# Patient Record
Sex: Female | Born: 1955
Health system: Southern US, Community
[De-identification: ages and names within clinical notes are randomized; demographics above are authoritative.]

## PROBLEM LIST (undated history)

## (undated) DIAGNOSIS — E039 Hypothyroidism, unspecified: Secondary | ICD-10-CM

## (undated) DIAGNOSIS — M47814 Spondylosis without myelopathy or radiculopathy, thoracic region: Secondary | ICD-10-CM

## (undated) DIAGNOSIS — F329 Major depressive disorder, single episode, unspecified: Secondary | ICD-10-CM

## (undated) DIAGNOSIS — F419 Anxiety disorder, unspecified: Secondary | ICD-10-CM

## (undated) DIAGNOSIS — K635 Polyp of colon: Secondary | ICD-10-CM

## (undated) DIAGNOSIS — F32A Depression, unspecified: Secondary | ICD-10-CM

## (undated) DIAGNOSIS — E785 Hyperlipidemia, unspecified: Secondary | ICD-10-CM

## (undated) DIAGNOSIS — R011 Cardiac murmur, unspecified: Secondary | ICD-10-CM

## (undated) DIAGNOSIS — T7840XA Allergy, unspecified, initial encounter: Secondary | ICD-10-CM

## (undated) DIAGNOSIS — I10 Essential (primary) hypertension: Secondary | ICD-10-CM

## (undated) DIAGNOSIS — G8929 Other chronic pain: Secondary | ICD-10-CM

## (undated) DIAGNOSIS — B029 Zoster without complications: Secondary | ICD-10-CM

## (undated) DIAGNOSIS — M542 Cervicalgia: Secondary | ICD-10-CM

## (undated) DIAGNOSIS — M1990 Unspecified osteoarthritis, unspecified site: Secondary | ICD-10-CM

## (undated) DIAGNOSIS — E7800 Pure hypercholesterolemia, unspecified: Secondary | ICD-10-CM

## (undated) DIAGNOSIS — J45909 Unspecified asthma, uncomplicated: Secondary | ICD-10-CM

## (undated) HISTORY — PX: TUBAL LIGATION: SHX77

## (undated) HISTORY — DX: Spondylosis without myelopathy or radiculopathy, thoracic region: M47.814

## (undated) HISTORY — DX: Unspecified asthma, uncomplicated: J45.909

## (undated) HISTORY — DX: Other chronic pain: G89.29

## (undated) HISTORY — PX: OTHER SURGICAL HISTORY: SHX169

## (undated) HISTORY — DX: Unspecified osteoarthritis, unspecified site: M19.90

## (undated) HISTORY — DX: Polyp of colon: K63.5

## (undated) HISTORY — DX: Depression, unspecified: F32.A

## (undated) HISTORY — DX: Essential (primary) hypertension: I10

## (undated) HISTORY — DX: Hypothyroidism, unspecified: E03.9

## (undated) HISTORY — DX: Cervicalgia: M54.2

## (undated) HISTORY — DX: Major depressive disorder, single episode, unspecified: F32.9

## (undated) HISTORY — DX: Pure hypercholesterolemia, unspecified: E78.00

## (undated) HISTORY — DX: Allergy, unspecified, initial encounter: T78.40XA

## (undated) HISTORY — DX: Anxiety disorder, unspecified: F41.9

## (undated) HISTORY — DX: Cardiac murmur, unspecified: R01.1

## (undated) HISTORY — DX: Hyperlipidemia, unspecified: E78.5

## (undated) HISTORY — DX: Zoster without complications: B02.9

---

## 1993-06-10 HISTORY — PX: OTHER SURGICAL HISTORY: SHX169

## 2005-03-27 ENCOUNTER — Ambulatory Visit (HOSPITAL_COMMUNITY): Admission: RE | Admit: 2005-03-27 | Discharge: 2005-03-27 | Payer: Self-pay | Admitting: Emergency Medicine

## 2005-04-08 ENCOUNTER — Encounter: Admission: RE | Admit: 2005-04-08 | Discharge: 2005-04-08 | Payer: Self-pay | Admitting: Emergency Medicine

## 2005-05-10 DIAGNOSIS — E785 Hyperlipidemia, unspecified: Secondary | ICD-10-CM

## 2005-05-10 HISTORY — DX: Hyperlipidemia, unspecified: E78.5

## 2005-05-20 ENCOUNTER — Other Ambulatory Visit: Admission: RE | Admit: 2005-05-20 | Discharge: 2005-05-20 | Payer: Self-pay | Admitting: Obstetrics and Gynecology

## 2006-05-12 ENCOUNTER — Ambulatory Visit (HOSPITAL_COMMUNITY): Admission: RE | Admit: 2006-05-12 | Discharge: 2006-05-12 | Payer: Self-pay | Admitting: Emergency Medicine

## 2006-07-21 ENCOUNTER — Other Ambulatory Visit: Admission: RE | Admit: 2006-07-21 | Discharge: 2006-07-21 | Payer: Self-pay | Admitting: Obstetrics & Gynecology

## 2006-08-05 ENCOUNTER — Encounter: Admission: RE | Admit: 2006-08-05 | Discharge: 2006-08-05 | Payer: Self-pay | Admitting: Emergency Medicine

## 2007-06-11 HISTORY — PX: COLONOSCOPY: SHX174

## 2007-06-18 ENCOUNTER — Ambulatory Visit (HOSPITAL_COMMUNITY): Admission: RE | Admit: 2007-06-18 | Discharge: 2007-06-18 | Payer: Self-pay | Admitting: Emergency Medicine

## 2007-07-27 ENCOUNTER — Other Ambulatory Visit: Admission: RE | Admit: 2007-07-27 | Discharge: 2007-07-27 | Payer: Self-pay | Admitting: Obstetrics & Gynecology

## 2007-10-07 LAB — HM COLONOSCOPY

## 2008-07-13 ENCOUNTER — Encounter: Admission: RE | Admit: 2008-07-13 | Discharge: 2008-07-13 | Payer: Self-pay | Admitting: Obstetrics and Gynecology

## 2008-08-17 ENCOUNTER — Other Ambulatory Visit: Admission: RE | Admit: 2008-08-17 | Discharge: 2008-08-17 | Payer: Self-pay | Admitting: Obstetrics and Gynecology

## 2009-06-10 DIAGNOSIS — E039 Hypothyroidism, unspecified: Secondary | ICD-10-CM

## 2009-06-10 HISTORY — DX: Hypothyroidism, unspecified: E03.9

## 2009-08-08 ENCOUNTER — Encounter: Admission: RE | Admit: 2009-08-08 | Discharge: 2009-08-08 | Payer: Self-pay | Admitting: Obstetrics and Gynecology

## 2010-01-06 ENCOUNTER — Emergency Department (HOSPITAL_COMMUNITY): Admission: EM | Admit: 2010-01-06 | Discharge: 2010-01-06 | Payer: Self-pay | Admitting: Emergency Medicine

## 2010-06-30 ENCOUNTER — Encounter: Payer: Self-pay | Admitting: Emergency Medicine

## 2010-07-01 ENCOUNTER — Encounter: Payer: Self-pay | Admitting: Obstetrics and Gynecology

## 2010-08-25 LAB — BASIC METABOLIC PANEL
BUN: 14 mg/dL (ref 6–23)
CO2: 26 mEq/L (ref 19–32)
Calcium: 8.1 mg/dL — ABNORMAL LOW (ref 8.4–10.5)
Chloride: 108 mEq/L (ref 96–112)
Creatinine, Ser: 0.68 mg/dL (ref 0.4–1.2)
GFR calc Af Amer: >60 mL/min (ref >60)
GFR calc non Af Amer: >60 mL/min (ref >60)
Glucose, Bld: 120 mg/dL — ABNORMAL HIGH (ref 70–99)
Potassium: 3.5 mEq/L (ref 3.5–5.1)
Sodium: 140 mEq/L (ref 135–145)

## 2010-08-25 LAB — DIFFERENTIAL
Basophils Absolute: 0 10*3/uL (ref 0.0–0.1)
Basophils Relative: 0 % (ref 0–1)
Eosinophils Absolute: 0.1 10*3/uL (ref 0.0–0.7)
Eosinophils Relative: 2 % (ref 0–5)
Lymphocytes Relative: 16 % (ref 12–46)
Lymphs Abs: 1 10*3/uL (ref 0.7–4.0)
Monocytes Absolute: 0.4 10*3/uL (ref 0.1–1.0)
Monocytes Relative: 6 % (ref 3–12)
Neutro Abs: 5 10*3/uL (ref 1.7–7.7)
Neutrophils Relative %: 77 % (ref 43–77)

## 2010-08-25 LAB — CBC
HCT: 35.9 % — ABNORMAL LOW (ref 36.0–46.0)
Hemoglobin: 12.3 g/dL (ref 12.0–15.0)
MCH: 31.8 pg (ref 26.0–34.0)
MCHC: 34.2 g/dL (ref 30.0–36.0)
MCV: 92.9 fL (ref 78.0–100.0)
Platelets: 178 10*3/uL (ref 150–400)
RBC: 3.86 MIL/uL — ABNORMAL LOW (ref 3.87–5.11)
RDW: 13 % (ref 11.5–15.5)
WBC: 6.6 10*3/uL (ref 4.0–10.5)

## 2010-08-25 LAB — URINALYSIS, ROUTINE W REFLEX MICROSCOPIC
Bilirubin Urine: NEGATIVE
Glucose, UA: NEGATIVE mg/dL
Hgb urine dipstick: NEGATIVE
Ketones, ur: NEGATIVE mg/dL
Nitrite: NEGATIVE
Protein, ur: NEGATIVE mg/dL
Specific Gravity, Urine: 1.015 (ref 1.005–1.030)
Urobilinogen, UA: 0.2 mg/dL (ref 0.0–1.0)
pH: 8 (ref 5.0–8.0)

## 2010-08-25 LAB — URINE CULTURE
Colony Count: NO GROWTH
Culture: NO GROWTH

## 2010-08-25 LAB — POCT CARDIAC MARKERS
CKMB, poc: <1 ng/mL — ABNORMAL LOW (ref 1.0–8.0)
Myoglobin, poc: 19.2 ng/mL (ref 12–200)
Troponin i, poc: <0.05 ng/mL (ref 0.00–0.09)

## 2010-09-24 ENCOUNTER — Other Ambulatory Visit: Payer: Self-pay | Admitting: Obstetrics and Gynecology

## 2010-09-24 DIAGNOSIS — Z1231 Encounter for screening mammogram for malignant neoplasm of breast: Secondary | ICD-10-CM

## 2010-10-03 ENCOUNTER — Ambulatory Visit (HOSPITAL_COMMUNITY)
Admission: RE | Admit: 2010-10-03 | Discharge: 2010-10-03 | Disposition: A | Discharge status: Home / Self Care | Payer: BLUE CROSS/BLUE SHIELD | Source: Ambulatory Visit | Attending: Obstetrics and Gynecology | Admitting: Obstetrics and Gynecology

## 2010-10-03 DIAGNOSIS — Z1231 Encounter for screening mammogram for malignant neoplasm of breast: Secondary | ICD-10-CM | POA: Insufficient documentation

## 2011-05-16 ENCOUNTER — Other Ambulatory Visit: Payer: Self-pay | Admitting: Family Medicine

## 2011-05-16 DIAGNOSIS — E039 Hypothyroidism, unspecified: Secondary | ICD-10-CM

## 2011-06-07 ENCOUNTER — Other Ambulatory Visit: Payer: Self-pay | Admitting: Obstetrics & Gynecology

## 2011-06-07 ENCOUNTER — Other Ambulatory Visit: Payer: Self-pay | Admitting: Obstetrics and Gynecology

## 2011-06-07 DIAGNOSIS — N644 Mastodynia: Secondary | ICD-10-CM

## 2011-06-11 DIAGNOSIS — B029 Zoster without complications: Secondary | ICD-10-CM

## 2011-06-11 HISTORY — DX: Zoster without complications: B02.9

## 2011-06-17 ENCOUNTER — Other Ambulatory Visit: Payer: BLUE CROSS/BLUE SHIELD

## 2011-07-24 ENCOUNTER — Other Ambulatory Visit: Payer: Self-pay | Admitting: Family Medicine

## 2011-07-24 DIAGNOSIS — E039 Hypothyroidism, unspecified: Secondary | ICD-10-CM

## 2011-07-30 ENCOUNTER — Ambulatory Visit
Admission: RE | Admit: 2011-07-30 | Discharge: 2011-07-30 | Disposition: A | Discharge status: Home / Self Care | Payer: BC Managed Care – PPO | Source: Ambulatory Visit | Attending: Family Medicine | Admitting: Family Medicine

## 2011-07-30 DIAGNOSIS — E039 Hypothyroidism, unspecified: Secondary | ICD-10-CM

## 2011-09-27 LAB — HM PAP SMEAR: HM Pap smear: NEGATIVE

## 2011-10-08 ENCOUNTER — Other Ambulatory Visit: Payer: Self-pay | Admitting: Nurse Practitioner

## 2011-10-08 DIAGNOSIS — Z1231 Encounter for screening mammogram for malignant neoplasm of breast: Secondary | ICD-10-CM

## 2011-11-01 ENCOUNTER — Ambulatory Visit (HOSPITAL_COMMUNITY): Payer: BC Managed Care – PPO

## 2011-12-11 ENCOUNTER — Ambulatory Visit (HOSPITAL_COMMUNITY)
Admission: RE | Admit: 2011-12-11 | Discharge: 2011-12-11 | Disposition: A | Discharge status: Home / Self Care | Payer: BC Managed Care – PPO | Source: Ambulatory Visit | Attending: Nurse Practitioner | Admitting: Nurse Practitioner

## 2011-12-11 DIAGNOSIS — Z1231 Encounter for screening mammogram for malignant neoplasm of breast: Secondary | ICD-10-CM

## 2011-12-11 LAB — HM MAMMOGRAPHY: HM Mammogram: NEGATIVE

## 2012-09-28 ENCOUNTER — Encounter: Payer: Self-pay | Admitting: *Deleted

## 2012-09-28 ENCOUNTER — Ambulatory Visit: Payer: Self-pay | Admitting: Nurse Practitioner

## 2012-09-28 DIAGNOSIS — Z01419 Encounter for gynecological examination (general) (routine) without abnormal findings: Secondary | ICD-10-CM

## 2012-09-29 ENCOUNTER — Encounter: Payer: Self-pay | Admitting: Nurse Practitioner

## 2012-11-06 ENCOUNTER — Ambulatory Visit: Payer: Self-pay | Admitting: Nurse Practitioner

## 2012-11-30 ENCOUNTER — Other Ambulatory Visit: Payer: Self-pay | Admitting: Nurse Practitioner

## 2012-11-30 DIAGNOSIS — Z1231 Encounter for screening mammogram for malignant neoplasm of breast: Secondary | ICD-10-CM

## 2012-12-01 ENCOUNTER — Ambulatory Visit (INDEPENDENT_AMBULATORY_CARE_PROVIDER_SITE_OTHER): Payer: BC Managed Care – PPO | Admitting: Nurse Practitioner

## 2012-12-01 ENCOUNTER — Encounter: Payer: Self-pay | Admitting: Nurse Practitioner

## 2012-12-01 VITALS — BP 104/60 | HR 60 | Ht 62.0 in | Wt 137.0 lb

## 2012-12-01 DIAGNOSIS — Z0000 Encounter for general adult medical examination without abnormal findings: Secondary | ICD-10-CM

## 2012-12-01 DIAGNOSIS — Z01419 Encounter for gynecological examination (general) (routine) without abnormal findings: Secondary | ICD-10-CM

## 2012-12-01 LAB — POCT URINALYSIS DIPSTICK
Bilirubin, UA: NEGATIVE
Blood, UA: NEGATIVE
Glucose, UA: NEGATIVE
Ketones, UA: NEGATIVE
Leukocytes, UA: NEGATIVE
Nitrite, UA: NEGATIVE
Protein, UA: NEGATIVE
Urobilinogen, UA: NEGATIVE
pH, UA: 5

## 2012-12-01 MED ORDER — ESTRADIOL 0.5 MG PO TABS: 0.5000 mg | ORAL_TABLET | Freq: Every day | ORAL | Status: DC

## 2012-12-01 MED ORDER — MEDROXYPROGESTERONE ACETATE 5 MG PO TABS: 5.0000 mg | ORAL_TABLET | Freq: Every day | ORAL | Status: DC

## 2012-12-01 NOTE — Patient Instructions (Addendum)

## 2012-12-01 NOTE — Progress Notes (Signed)
Patient ID: Sonia Kim, female   DOB: 23-Apr-1956, 57 y.o.   MRN: [id number] 57 y.o. G3P3 Married Caucasian Fe here for annual exam. She feels well. Her youngest daughter goes to college this fall. She has had no further vaginal spotting since having episode of PMB 10/13. Endo biopsy showed polyp without hyperplasia.    No LMP recorded. Patient is not currently having periods (Reason: Perimenopausal).          Sexually active: no  The current method of family planning is none.    Exercising: yes  Home exercise routine includes walking, running. Smoker:  no  Health Maintenance: Pap:  09/27/11 normal with negative HR HPV MMG:  12/11/11 scheduled in July Colonoscopy:  10/07/07 polyp recheck in 5 years will schedule for fall BMD:   Never    TDaP:  2013 Labs: today   reports that she quit smoking about 8 years ago. She has never used smokeless tobacco. She reports that  drinks alcohol. She reports that she does not use illicit drugs.  Past Medical History  Diagnosis Date  . Anxiety   . Hyperlipidemia 05/2005  . Hypothyroid     Past Surgical History  Procedure Laterality Date  . Tubal ligation  05/2005    Current Outpatient Prescriptions  Medication Sig Dispense Refill  . estradiol (ESTRACE) 0.5 MG tablet Take 1 tablet by mouth daily.      . fluticasone (FLONASE) 50 MCG/ACT nasal spray as needed.      . Glucosamine-Chondroit-Vit C-Mn (GLUCOSAMINE 1500 COMPLEX PO) Take by mouth daily.      Marland Kitchen levothyroxine (SYNTHROID) 25 MCG tablet Take 25 mcg by mouth daily before breakfast.      . medroxyPROGESTERone (PROVERA) 5 MG tablet Take 1 tablet by mouth daily.      . sertraline (ZOLOFT) 50 MG tablet Take 50 mg by mouth daily.      . simvastatin (ZOCOR) 10 MG tablet Take 10 mg by mouth at bedtime.       No current facility-administered medications for this visit.    Family History  Problem Relation Age of Onset  . Thyroid disease Mother   . CVA Father   . Diabetes Father   . Lung  cancer Mother     ROS:  Pertinent items are noted in HPI.  Otherwise, a comprehensive ROS was negative.  Exam:   BP 104/60  Pulse 60  Ht 5\' 2"  (1.575 m)  Wt 137 lb (62.143 kg)  BMI 25.05 kg/m2 Height: 5\' 2"  (157.5 cm)  Ht Readings from Last 3 Encounters:  12/01/12 5\' 2"  (1.575 m)    General appearance: alert, cooperative and appears stated age Head: Normocephalic, without obvious abnormality, atraumatic Neck: no adenopathy, supple, symmetrical, trachea midline and thyroid normal to inspection and palpation Lungs: clear to auscultation bilaterally Breasts: normal appearance, no masses or tenderness Heart: regular rate and rhythm Abdomen: soft, non-tender; no masses,  no organomegaly Extremities: extremities normal, atraumatic, no cyanosis or edema Skin: Skin color, texture, turgor normal. No rashes or lesions Lymph nodes: Cervical, supraclavicular, and axillary nodes normal. No abnormal inguinal nodes palpated Neurologic: Grossly normal   Pelvic: External genitalia:  no lesions              Urethra:  normal appearing urethra with no masses, tenderness or lesions              Bartholin's and Skene's: normal  Vagina: normal appearing vagina with normal color and discharge, no lesions              Cervix: anteverted              Pap taken: no Bimanual Exam:  Uterus:  normal size, contour, position, consistency, mobility, non-tender              Adnexa: no mass, fullness, tenderness               Rectovaginal: Confirms               Anus:  normal sphincter tone, no lesions  A:  Well Woman with normal exam  Postmenopausal on HRT  PMB 03/2012 secondary to polyp  Hypothyroid on replacement  P:   Pap smear as per guidelines  Refill HRT estrace and provera for this next year.  Counseled on risk and benefits of HRT including CVA, DVT, cancer, etc.  Mammogram due in July  counseled on breast self exam, adequate intake of calcium and vitamin D,   diet and exercise,  Kegel's exercises return annually or prn  An After Visit Summary was printed and given to the patient.

## 2012-12-02 NOTE — Progress Notes (Signed)
Encounter reviewed by Dr. Flo Berroa Silva.  

## 2012-12-22 ENCOUNTER — Ambulatory Visit (HOSPITAL_COMMUNITY): Payer: BC Managed Care – PPO

## 2013-01-01 ENCOUNTER — Ambulatory Visit (HOSPITAL_COMMUNITY)
Admission: RE | Admit: 2013-01-01 | Discharge: 2013-01-01 | Disposition: A | Discharge status: Home / Self Care | Payer: BC Managed Care – PPO | Source: Ambulatory Visit | Attending: Nurse Practitioner | Admitting: Nurse Practitioner

## 2013-01-01 DIAGNOSIS — Z1231 Encounter for screening mammogram for malignant neoplasm of breast: Secondary | ICD-10-CM | POA: Insufficient documentation

## 2013-09-09 ENCOUNTER — Telehealth: Payer: Self-pay | Admitting: Nurse Practitioner

## 2013-09-09 ENCOUNTER — Encounter: Payer: Self-pay | Admitting: Nurse Practitioner

## 2013-09-09 NOTE — Telephone Encounter (Signed)
Spoke with patient. For about a week and a half she is had had bilateral breast tenderness, denies pain, and also increased in vaginal discharge and vaginal itching. She has not taken her estrace in two days because she was afraid estrace was r/t to symptoms. She is now experiencing hot flashes which she feels is r/s dc Estrace and progesterone. Patient wanted to know what Patty would suggest. I advised office visit for evaluation of both breast and pelvic concerns.  Patient requests to wait until next week for visit, requests appointment on 09/17/13 and scheduled with Lauro FranklinPatricia Rolen-Grubb, FNP. Patient will call back if any new concerns or changes in symptoms.     Routing to provider for final review. Patient agreeable to disposition. Will close encounter

## 2013-09-09 NOTE — Telephone Encounter (Signed)
Patient says she is having breast tenderness and problems "down below". Patient thinks this may be related to hormones.

## 2013-09-16 ENCOUNTER — Other Ambulatory Visit: Payer: Self-pay | Admitting: Nurse Practitioner

## 2013-09-16 ENCOUNTER — Ambulatory Visit (INDEPENDENT_AMBULATORY_CARE_PROVIDER_SITE_OTHER): Payer: BC Managed Care – PPO | Admitting: Nurse Practitioner

## 2013-09-16 ENCOUNTER — Encounter: Payer: Self-pay | Admitting: Nurse Practitioner

## 2013-09-16 VITALS — BP 116/64 | HR 68 | Ht 62.0 in | Wt 140.0 lb

## 2013-09-16 DIAGNOSIS — N644 Mastodynia: Secondary | ICD-10-CM

## 2013-09-16 NOTE — Progress Notes (Signed)
Subjective:     Patient ID: Sonia Labrumenise D Kim, female   DOB: August 24, 1955, 58 y.o.   MRN: [id number]  Vaginal Itching The patient's primary symptoms include a vaginal discharge. The patient's pertinent negatives include no pelvic pain. Pertinent negatives include no abdominal pain, chills, constipation, diarrhea, dysuria, fever, flank pain, frequency, nausea, urgency or vomiting.   This G3, P3 WM 58 yo Fe complains of vaginal discomfort for about 2 weeks.  Symptoms are intermittent. "Feels like things are changing down there".  Maybe slight itching but no discharge. Sometimes feels swollen and dryer.   Has not been SA in years.  Denies urinary symptoms. Also complains of bilateral breast tenderness about the same time.  She had been wearing a sports bra and going to the gym so she thought that may be related.  She has held her exercise routine and things have not changed.  Tenderness is there all day - she did try wearing a sports bra to bed to see if that would help it did not.  There has been no history of injury or trauma.  She did go off HRT for 2 days and no change in symptoms.  She has not changed other med's and is on no Herbal med's. No increase in caffeine.   When she has a combination of both symptoms she thought maybe was going to start a cycle. But has had no bleeding. No complaints of nipple discharge.  Review of Systems  Constitutional: Negative for fever, chills and fatigue.  Respiratory: Negative.   Cardiovascular: Negative.   Gastrointestinal: Negative.  Negative for nausea, vomiting, abdominal pain, diarrhea and constipation.  Genitourinary: Positive for vaginal discharge and vaginal pain. Negative for dysuria, urgency, frequency, flank pain, vaginal bleeding and pelvic pain.       Last episode of PMB was 03/26/12, she had endo polyp without evidence of hyperplasia on endo biopsy. On HRT since 12/2010. Last mammo 7/ 2014 normal  Musculoskeletal: Negative.  Negative for myalgias.   Neurological: Negative.   Psychiatric/Behavioral: Negative.        Objective:   Physical Exam  Constitutional: She is oriented to person, place, and time. She appears well-developed and well-nourished.  Pulmonary/Chest: Effort normal.  Abdominal: Soft. She exhibits no distension. There is no tenderness. There is no rebound and no guarding.  No rash or areas of infection. Breast bilateral tenderness around the areola and nipples without a mass. She does have FBC's changes and they are dense.  No nipple discharge and no lymph nodes.  Genitourinary:  Atrophic vaginal changes both externally and internally.  No vaginal discharge. There is 1st degree rectocele. No pain on bimanual.  Neurological: She is alert and oriented to person, place, and time.  Skin: Skin is warm and dry.  Psychiatric: She has a normal mood and affect. Her behavior is normal. Judgment and thought content normal.       Assessment:     Bilateral  breast tenderness ? Etiology Atrophic vaginitis without signs of infection Postmenopausal on HRT    Plan:     Will get bilateral diagnostic Mammo and will follow Advised to use extra virgin olive oil to outside vulvar areas after a bath.  She may also use Aveeno bath prn. If symptoms persist to call back

## 2013-09-16 NOTE — Patient Instructions (Signed)
We will schedule Mammo

## 2013-09-16 NOTE — Progress Notes (Signed)
Diagnostic MMG with possible ultrasound scheduled for 09-27-13 0830 at [facility].  Patient agreeable to date and time.

## 2013-09-21 NOTE — Progress Notes (Signed)
Encounter reviewed by Dr. Ocean Schildt Silva.  

## 2013-09-24 NOTE — Telephone Encounter (Signed)
Patient calling to report she is cancelling her MMG appointment for breast tenderness 09/[facility]/13 [facility] [place]. She states she is feeling better and will just have her regular screening MMG in July.

## 2013-09-24 NOTE — Telephone Encounter (Signed)
Dr. Hyacinth MeekerMiller, patient has cancelled her dx mammogram. Should she be removed from hold? A letter sent?

## 2013-09-27 ENCOUNTER — Other Ambulatory Visit: Payer: BC Managed Care – PPO

## 2013-10-18 NOTE — Telephone Encounter (Signed)
Out of hold. Will close encounter.

## 2013-10-18 NOTE — Telephone Encounter (Signed)
I am ok with her canceling this.  No significant breast hx.  Was scheduled due to pain.  Ok to removed from hold.  Thanks.

## 2013-12-03 ENCOUNTER — Ambulatory Visit: Payer: BC Managed Care – PPO | Admitting: Nurse Practitioner

## 2013-12-06 ENCOUNTER — Ambulatory Visit (INDEPENDENT_AMBULATORY_CARE_PROVIDER_SITE_OTHER): Payer: BC Managed Care – PPO | Admitting: Nurse Practitioner

## 2013-12-06 ENCOUNTER — Encounter: Payer: Self-pay | Admitting: Nurse Practitioner

## 2013-12-06 VITALS — BP 120/54 | HR 60 | Ht 62.0 in | Wt 137.0 lb

## 2013-12-06 DIAGNOSIS — Z79890 Hormone replacement therapy (postmenopausal): Secondary | ICD-10-CM

## 2013-12-06 DIAGNOSIS — Z01419 Encounter for gynecological examination (general) (routine) without abnormal findings: Secondary | ICD-10-CM

## 2013-12-06 DIAGNOSIS — Z0000 Encounter for general adult medical examination without abnormal findings: Secondary | ICD-10-CM

## 2013-12-06 LAB — POCT URINALYSIS DIPSTICK
Bilirubin, UA: NEGATIVE
Blood, UA: NEGATIVE
Glucose, UA: NEGATIVE
Ketones, UA: NEGATIVE
Leukocytes, UA: NEGATIVE
Nitrite, UA: NEGATIVE
Protein, UA: NEGATIVE
Urobilinogen, UA: NEGATIVE
pH, UA: 6

## 2013-12-06 MED ORDER — MEDROXYPROGESTERONE ACETATE 5 MG PO TABS: 5.0000 mg | ORAL_TABLET | Freq: Every day | ORAL | Status: DC

## 2013-12-06 MED ORDER — ESTRADIOL 0.5 MG PO TABS: 0.5000 mg | ORAL_TABLET | Freq: Every day | ORAL | Status: DC

## 2013-12-06 NOTE — Progress Notes (Signed)
58 y.o. G3P3 Married Caucasian Fe here for annual exam.  She feels well. No new health problems. She wants to continue on HRT but OK to try a lower dose in the fall.  Patient's last menstrual period was 03/10/2010.          Sexually active: No.  The current method of family planning is none.    Exercising: Yes.    Home exercise routine includes walking and eliptical. Smoker:  no  Health Maintenance: Pap:  09/27/11 normal MMG:  01/01/13, WNL Colonoscopy:  10/07/2007 BMD:   Not done TDaP:  2013 Labs: PCP    Urine: Normal   reports that she quit smoking about 8 years ago. She has never used smokeless tobacco. She reports that she drinks alcohol. She reports that she does not use illicit drugs.  Past Medical History  Diagnosis Date  . Anxiety   . Hyperlipidemia 05/2005  . Hypothyroid 06/2009  . Hypercholesteremia     Past Surgical History  Procedure Laterality Date  . Tubal ligation  1984 & 05/2005    reversal in 1995    Current Outpatient Prescriptions  Medication Sig Dispense Refill  . ALPRAZolam (XANAX) 0.5 MG tablet Take 0.5 mg by mouth as needed for anxiety.      Marland Kitchen. estradiol (ESTRACE) 0.5 MG tablet Take 1 tablet (0.5 mg total) by mouth daily.  90 tablet  3  . fluticasone (FLONASE) 50 MCG/ACT nasal spray as needed.      . Glucosamine-Chondroit-Vit C-Mn (GLUCOSAMINE 1500 COMPLEX PO) Take by mouth daily.      Marland Kitchen. levothyroxine (SYNTHROID, LEVOTHROID) 50 MCG tablet Take 50 mcg by mouth daily before breakfast.      . medroxyPROGESTERone (PROVERA) 5 MG tablet Take 1 tablet (5 mg total) by mouth daily.  90 tablet  3  . sertraline (ZOLOFT) 25 MG tablet Take 25 mg by mouth daily.      . simvastatin (ZOCOR) 10 MG tablet Take 10 mg by mouth at bedtime.       No current facility-administered medications for this visit.    Family History  Problem Relation Age of Onset  . Thyroid disease Mother   . Lung cancer Mother   . CVA Father   . Diabetes Father     ROS:  Pertinent items are  noted in HPI.  Otherwise, a comprehensive ROS was negative.  Exam:   BP 120/54  Pulse 60  Ht 5\' 2"  (1.575 m)  Wt 137 lb (62.143 kg)  BMI 25.05 kg/m2  LMP 03/10/2010 Height: 5\' 2"  (157.5 cm)  Ht Readings from Last 3 Encounters:  12/06/13 5\' 2"  (1.575 m)  09/16/13 5\' 2"  (1.575 m)  12/01/12 5\' 2"  (1.575 m)    General appearance: alert, cooperative and appears stated age Head: Normocephalic, without obvious abnormality, atraumatic Neck: no adenopathy, supple, symmetrical, trachea midline and thyroid normal to inspection and palpation Lungs: clear to auscultation bilaterally Breasts: normal appearance, no masses or tenderness Heart: regular rate and rhythm Abdomen: soft, non-tender; no masses,  no organomegaly Extremities: extremities normal, atraumatic, no cyanosis or edema Skin: Skin color, texture, turgor normal. No rashes or lesions Lymph nodes: Cervical, supraclavicular, and axillary nodes normal. No abnormal inguinal nodes palpated Neurologic: Grossly normal   Pelvic: External genitalia:  no lesions              Urethra:  normal appearing urethra with no masses, tenderness or lesions  Bartholin's and Skene's: normal                 Vagina: normal appearing vagina with normal color and discharge, no lesions              Cervix: anteverted              Pap taken: No. Bimanual Exam:  Uterus:  normal size, contour, position, consistency, mobility, non-tender              Adnexa: no mass, fullness, tenderness               Rectovaginal: Confirms               Anus:  normal sphincter tone, no lesions  A:  Well Woman with normal exam  Postmenopausal on HRT since 3/ 2012  History of PMB on HRT with negative eval on 03/26/2012  P:   Reviewed health and wellness pertinent to exam  Pap smear not taken today  Mammogram is due 12/2013  Refill on Estrace 05 mg and Provera 5 mg daily for a year  Counseled on potential risk of DVT, CVA, cancer, etc.  She is going to try  decrease to 1/2 tablet of HRT this fall and see how she tolerates until next AEX - will try taper even more  Counseled on breast self exam, mammography screening, menopause, osteoporosis, adequate intake of calcium and vitamin D, diet and exercise, Kegel's exercises return annually or prn  An After Visit Summary was printed and given to the patient.

## 2013-12-06 NOTE — Patient Instructions (Signed)

## 2013-12-07 NOTE — Progress Notes (Signed)
Encounter reviewed by Dr. Taaj Hurlbut Silva.  

## 2013-12-16 ENCOUNTER — Telehealth: Payer: Self-pay | Admitting: Emergency Medicine

## 2013-12-16 ENCOUNTER — Other Ambulatory Visit: Payer: Self-pay | Admitting: Nurse Practitioner

## 2013-12-16 DIAGNOSIS — Z1231 Encounter for screening mammogram for malignant neoplasm of breast: Secondary | ICD-10-CM

## 2013-12-16 NOTE — Telephone Encounter (Signed)
Radiology tech from [facility] calling. She wanted to clarify that patient was okay to schedule screening mammogram only at [facility].  States that patient has advised no longer having breast pain and declines diagnostic testing.  Patient cancelled her dx imaging at breast center.  Advised since patient had recent annual examination with Lauro FranklinPatricia Rolen-Grubb, FNP and is due for screening in notes, can do screening only since patient denies further breast tenderness.  Patty do you agree?

## 2013-12-17 NOTE — Telephone Encounter (Signed)
Yes agree with screening - then if anything found can do diagnostic.

## 2014-01-05 ENCOUNTER — Ambulatory Visit (HOSPITAL_COMMUNITY): Payer: BC Managed Care – PPO

## 2014-01-17 ENCOUNTER — Ambulatory Visit (HOSPITAL_COMMUNITY)
Admission: RE | Admit: 2014-01-17 | Discharge: 2014-01-17 | Disposition: A | Discharge status: Home / Self Care | Payer: BC Managed Care – PPO | Source: Ambulatory Visit | Attending: Nurse Practitioner | Admitting: Nurse Practitioner

## 2014-01-17 DIAGNOSIS — Z1231 Encounter for screening mammogram for malignant neoplasm of breast: Secondary | ICD-10-CM | POA: Insufficient documentation

## 2014-04-11 ENCOUNTER — Encounter: Payer: Self-pay | Admitting: Nurse Practitioner

## 2014-06-27 ENCOUNTER — Telehealth: Payer: Self-pay | Admitting: Nurse Practitioner

## 2014-06-27 NOTE — Telephone Encounter (Signed)
Patient calling requesting to speak with the nurse about "constant hot flashes."

## 2014-06-27 NOTE — Telephone Encounter (Signed)
Spoke with patient. Patient states that when she was last seen for her aex in June she discussed lowering dose of HRT in the fall. States she began to take half the prescribed dose for one month with increased hot flashes. "It was terrbiel so I went back on the full dose. Now I am having hot flashes throughout the night. I am not sleeping at all and the day is miserable. My hormones are crazy too." Currently taking Estrace 0.5mg  and Provera 5mg  daily. Advised will need to come in for office visit with Lauro FranklinPatricia Rolen-Grubb, FNP for evaluation. Patient is agreeable. Appointment scheduled for 1/22 at 10:15am with Lauro FranklinPatricia Rolen-Grubb, FNP. Agreeable to date and time.  Routing to provider for final review. Patient agreeable to disposition. Will close encounter

## 2014-07-01 ENCOUNTER — Ambulatory Visit: Payer: Self-pay | Admitting: Nurse Practitioner

## 2014-07-04 ENCOUNTER — Ambulatory Visit: Payer: Self-pay | Admitting: Nurse Practitioner

## 2014-07-04 ENCOUNTER — Telehealth: Payer: Self-pay | Admitting: Nurse Practitioner

## 2014-07-04 NOTE — Telephone Encounter (Signed)
Pt DNKA today bad weather LMTCB 'increased hot flashes and mood swings on HRT"

## 2014-07-08 NOTE — Telephone Encounter (Signed)
Attempted to reach patient at number provided [phone]. Phone call was answered and then disconnected. Will try again later.

## 2014-07-12 ENCOUNTER — Encounter: Payer: Self-pay | Admitting: Nurse Practitioner

## 2014-07-12 ENCOUNTER — Ambulatory Visit (INDEPENDENT_AMBULATORY_CARE_PROVIDER_SITE_OTHER): Payer: 59 | Admitting: Nurse Practitioner

## 2014-07-12 VITALS — BP 120/66 | HR 64 | Ht 62.0 in | Wt 136.0 lb

## 2014-07-12 DIAGNOSIS — R232 Flushing: Secondary | ICD-10-CM

## 2014-07-12 LAB — CBC WITH DIFFERENTIAL/PLATELET
Basophils Absolute: 0.1 10*3/uL (ref 0.0–0.1)
Basophils Relative: 1 % (ref 0–1)
Eosinophils Absolute: 0.2 10*3/uL (ref 0.0–0.7)
Eosinophils Relative: 2 % (ref 0–5)
HCT: 41.7 % (ref 36.0–46.0)
Hemoglobin: 14 g/dL (ref 12.0–15.0)
Lymphocytes Relative: 23 % (ref 12–46)
Lymphs Abs: 2.2 10*3/uL (ref 0.7–4.0)
MCH: 31.2 pg (ref 26.0–34.0)
MCHC: 33.6 g/dL (ref 30.0–36.0)
MCV: 92.9 fL (ref 78.0–100.0)
MPV: 10.6 fL (ref 8.6–12.4)
Monocytes Absolute: 0.7 10*3/uL (ref 0.1–1.0)
Monocytes Relative: 7 % (ref 3–12)
Neutro Abs: 6.3 10*3/uL (ref 1.7–7.7)
Neutrophils Relative %: 67 % (ref 43–77)
Platelets: 262 10*3/uL (ref 150–400)
RBC: 4.49 MIL/uL (ref 3.87–5.11)
RDW: 12.9 % (ref 11.5–15.5)
WBC: 9.4 10*3/uL (ref 4.0–10.5)

## 2014-07-12 NOTE — Telephone Encounter (Signed)
Patient is scheduled for today 2/2 at 12:45pm with Lauro FranklinPatricia Rolen-Grubb, FNP.  Routing to provider for final review. Patient agreeable to disposition. Will close encounter

## 2014-07-12 NOTE — Progress Notes (Signed)
Patient ID: Curlene LabrumDenise D Cashatt, female   DOB: Jul 11, 1955, 59 y.o.   MRN: [id number] S: This 59 yo WM Fe presents for a consultation about increase in vaso symptoms.  She has been on HRT since 08/2010.  In June 2015 at [facility] she was asked to come down to a lower dose of pill at 1/2 dose in the fall.  She was given rationale and agreed to taper down.  Come the fall right away noted as increase in vaso symptoms with night sweats that were the worst.  Increase in insomnia and mood changes.  The taper was only done for 2 weeks and then back to regular dosing.  Maybe did OK for a short time but since then has had an increase in symptoms despite anything she does.  She has tried to increase Zoloft recently 2 weeks ago from 25 mg to 50 mg without help  Some situational stressors with her daughter going through a divorce.   Other symptoms of increase in anxiety.  She has Xanax but only takes maybe 1 time a week.  She also has fatigue from the insomnia.  She did see PCP in December and had labs done - will get copies of those and compare.  Denies any other constitutional symptoms.   A: Increase in Vaso symptoms with insomnia  Situational stressors  Plan: Will check Estradiol, TSH, Vit D, and CBC and follow  Will get previous labs from PCP  She is given card for Berniece AndreasJulie Whitt in case she feels she needs counseling  Consultation time:  Mother and daughter is 15 minutes

## 2014-07-13 LAB — VITAMIN D 25 HYDROXY (VIT D DEFICIENCY, FRACTURES): Vit D, 25-Hydroxy: 32 ng/mL (ref 30–100)

## 2014-07-13 LAB — TSH: TSH: 2.672 u[IU]/mL (ref 0.350–4.500)

## 2014-07-13 LAB — ESTRADIOL: Estradiol: 27.3 pg/mL

## 2014-07-17 NOTE — Progress Notes (Signed)
Encounter reviewed by Dr. Selma Mink Silva.  

## 2014-08-01 ENCOUNTER — Telehealth: Payer: Self-pay | Admitting: Nurse Practitioner

## 2014-08-01 MED ORDER — ESTRADIOL 0.5 MG PO TABS: 0.5000 mg | ORAL_TABLET | Freq: Every day | ORAL | Status: DC

## 2014-08-01 MED ORDER — MEDROXYPROGESTERONE ACETATE 5 MG PO TABS: 5.0000 mg | ORAL_TABLET | Freq: Every day | ORAL | Status: DC

## 2014-08-01 NOTE — Telephone Encounter (Signed)
Pt needs refill for estradiol and medroxy tabs [facility] at 1 [phone].

## 2014-08-01 NOTE — Telephone Encounter (Signed)
Medication refill request: Provera and estradiol Last AEX:  12/06/13 Next AEX: 12/08/14 PG Last MMG (if hormonal medication request): 01/17/14 BIRADS1:neg Refill authorized: both 12/06/13 x 1 year [facility]ills [facility]  Patient using new mail order - [facility]. Rx sent to [facility] with refills left until 11/2014 as prescribed by PG  -[facility] for final [facility]. - Encounter closed.

## 2014-12-08 ENCOUNTER — Ambulatory Visit: Payer: BC Managed Care – PPO | Admitting: Nurse Practitioner

## 2014-12-23 ENCOUNTER — Other Ambulatory Visit: Payer: Self-pay | Admitting: Nurse Practitioner

## 2014-12-23 NOTE — Telephone Encounter (Signed)
Medication refill request: Provera 5  Estrace 0.5 Last AEX:  12/06/13 with PG Next AEX: 01/16/15 with PG Last MMG (if hormonal medication request): 01/17/14 Bi-rads C 1 neg. Refill authorized: Please advise.

## 2014-12-27 ENCOUNTER — Other Ambulatory Visit (HOSPITAL_COMMUNITY): Payer: Self-pay | Admitting: Family Medicine

## 2014-12-27 DIAGNOSIS — Z1231 Encounter for screening mammogram for malignant neoplasm of breast: Secondary | ICD-10-CM

## 2015-01-16 ENCOUNTER — Ambulatory Visit (INDEPENDENT_AMBULATORY_CARE_PROVIDER_SITE_OTHER): Payer: 59 | Admitting: Nurse Practitioner

## 2015-01-16 ENCOUNTER — Encounter: Payer: Self-pay | Admitting: Nurse Practitioner

## 2015-01-16 VITALS — BP 120/68 | HR 62 | Resp 14 | Ht 62.5 in | Wt 134.0 lb

## 2015-01-16 DIAGNOSIS — E559 Vitamin D deficiency, unspecified: Secondary | ICD-10-CM | POA: Insufficient documentation

## 2015-01-16 DIAGNOSIS — Z79890 Hormone replacement therapy (postmenopausal): Secondary | ICD-10-CM | POA: Diagnosis not present

## 2015-01-16 DIAGNOSIS — Z0000 Encounter for general adult medical examination without abnormal findings: Secondary | ICD-10-CM

## 2015-01-16 DIAGNOSIS — E039 Hypothyroidism, unspecified: Secondary | ICD-10-CM | POA: Diagnosis not present

## 2015-01-16 DIAGNOSIS — Z01419 Encounter for gynecological examination (general) (routine) without abnormal findings: Secondary | ICD-10-CM

## 2015-01-16 LAB — POCT URINALYSIS DIPSTICK
Bilirubin, UA: NEGATIVE
Blood, UA: NEGATIVE
Glucose, UA: NEGATIVE
Ketones, UA: NEGATIVE
Leukocytes, UA: NEGATIVE
Nitrite, UA: NEGATIVE
Protein, UA: NEGATIVE
Urobilinogen, UA: NEGATIVE
pH, UA: 5

## 2015-01-16 MED ORDER — ESTRADIOL 0.5 MG PO TABS: ORAL_TABLET | ORAL | Status: DC

## 2015-01-16 MED ORDER — MEDROXYPROGESTERONE ACETATE 5 MG PO TABS: ORAL_TABLET | ORAL | Status: DC

## 2015-01-16 NOTE — Patient Instructions (Signed)

## 2015-01-16 NOTE — Progress Notes (Signed)
59 y.o. G3P3 Married  Caucasian Fe here for annual exam.  Doing well with vaso symptoms.  Still wants to continue on HRT.  May want to start taper by next year to 1/2 dose.  No vaginal bleeding.  Some increase in OA pain in her hands, fingers, hips. No extra Vit D at this time.  Patient's last menstrual period was 03/10/2010.          Sexually active: No.  The current method of family planning is none, perimenopausal.  Exercising: Yes.    walking, weights Smoker:  no  Health Maintenance: Pap:  09/27/2011, negative with neg HR HPV MMG:  01/17/2014, Bi-Rads 1:  Negative; density category B Pt is currently scheduled for MMG on 01/19/15. Colonoscopy:  10/04/13, normal, repeat in 5 years TDaP:  2013 Labs: PCP Dr. Carilyn Goodpasture   UA: Negative   reports that she quit smoking about 9 years ago. She has never used smokeless tobacco. She reports that she drinks alcohol. She reports that she does not use illicit drugs.  Past Medical History  Diagnosis Date  . Anxiety   . Hyperlipidemia 05/2005  . Hypothyroid 06/2009  . Hypercholesteremia     Past Surgical History  Procedure Laterality Date  . Tubal ligation  1984 & 05/2005    reversal in 1995    Current Outpatient Prescriptions  Medication Sig Dispense Refill  . ALPRAZolam (XANAX) 0.5 MG tablet Take 0.5 mg by mouth as needed for anxiety.    Marland Kitchen estradiol (ESTRACE) 0.5 MG tablet Take 1 tablet by mouth  daily 90 tablet 3  . fluticasone (FLONASE) 50 MCG/ACT nasal spray as needed.    . Glucosamine-Chondroit-Vit C-Mn (GLUCOSAMINE 1500 COMPLEX PO) Take by mouth daily.    Marland Kitchen levothyroxine (SYNTHROID, LEVOTHROID) 50 MCG tablet Take 50 mcg by mouth daily before breakfast.    . medroxyPROGESTERone (PROVERA) 5 MG tablet Take 1 tablet by mouth  daily 90 tablet 3  . sertraline (ZOLOFT) 25 MG tablet Take 25 mg by mouth daily.    . simvastatin (ZOCOR) 20 MG tablet Take 0.5 tablets by mouth at bedtime.  11   No current facility-administered medications  for this visit.    Family History  Problem Relation Age of Onset  . Thyroid disease Mother   . Lung cancer Mother   . CVA Father   . Diabetes Father     ROS:  Pertinent items are noted in HPI.  Otherwise, a comprehensive ROS was negative.  Exam:   BP 120/68 mmHg  Pulse 62  Resp 14  Ht 5' 2.5" (1.588 m)  Wt 134 lb (60.782 kg)  BMI 24.10 kg/m2  LMP 03/10/2010 Height: 5' 2.5" (158.8 cm) Ht Readings from Last 3 Encounters:  01/16/15 5' 2.5" (1.588 m)  07/12/14 5\' 2"  (1.575 m)  12/06/13 5\' 2"  (1.575 m)    General appearance: alert, cooperative and appears stated age Head: Normocephalic, without obvious abnormality, atraumatic Neck: no adenopathy, supple, symmetrical, trachea midline and thyroid normal to inspection and palpation Lungs: clear to auscultation bilaterally Breasts: normal appearance, no masses or tenderness Heart: regular rate and rhythm Abdomen: soft, non-tender; no masses,  no organomegaly Extremities: extremities normal, atraumatic, no cyanosis or edema Skin: Skin color, texture, turgor normal. No rashes or lesions Lymph nodes: Cervical, supraclavicular, and axillary nodes normal. No abnormal inguinal nodes palpated Neurologic: Grossly normal   Pelvic: External genitalia:  no lesions              Urethra:  normal  appearing urethra with no masses, tenderness or lesions              Bartholin's and Skene's: normal                 Vagina: normal appearing vagina with normal color and discharge, no lesions              Cervix: anteverted              Pap taken: Yes.   Bimanual Exam:  Uterus:  normal size, contour, position, consistency, mobility, non-tender              Adnexa: no mass, fullness, tenderness               Rectovaginal: Confirms               Anus:  normal sphincter tone, no lesions  Chaperone present: Yes  A:  Well Woman with normal exam  Postmenopausal on HRT since 3/ 2012 History of PMB on HRT with negative eval on  03/26/2012  Hypothyroid   History of Vit d deficiency  P:   Reviewed health and wellness pertinent to exam  Pap smear as above  Mammogram is due and scheduled  Refill on HRT - Provera and Estrace for a year  Counseled with risk of DVT, CVA, cancer, etc.  Counseled on breast self exam, mammography screening, use and side effects of HRT, adequate intake of calcium and vitamin D, diet and exercise return annually or prn  An After Visit Summary was printed and given to the patient.

## 2015-01-17 ENCOUNTER — Telehealth: Payer: Self-pay | Admitting: *Deleted

## 2015-01-17 LAB — VITAMIN D 25 HYDROXY (VIT D DEFICIENCY, FRACTURES): Vit D, 25-Hydroxy: 30 ng/mL (ref 30–100)

## 2015-01-17 NOTE — Progress Notes (Signed)
Encounter reviewed by Dr. Lovene Maret Amundson C. Silva.  

## 2015-01-17 NOTE — Telephone Encounter (Signed)
I have attempted to contact this patient by phone with the following results: left message to return call to [place] at [phone] on answering machine (mobile per [facility]).  No personal information given.  [phone] (Mobile) *Preferred*

## 2015-01-17 NOTE — Telephone Encounter (Signed)
-----   Message from Ria Comment, FNP sent at 01/17/2015  8:18 AM EDT ----- Please let pt. Know that Vit D did go down a little from 32 - 30.  She may add OTC Vit d 1000 IU daily as discussed.

## 2015-01-17 NOTE — Telephone Encounter (Signed)
Pt notified in result note.  Closing encounter. 

## 2015-01-19 ENCOUNTER — Ambulatory Visit (HOSPITAL_COMMUNITY)
Admission: RE | Admit: 2015-01-19 | Discharge: 2015-01-19 | Disposition: A | Discharge status: Home / Self Care | Payer: 59 | Source: Ambulatory Visit | Attending: Family Medicine | Admitting: Family Medicine

## 2015-01-19 DIAGNOSIS — Z1231 Encounter for screening mammogram for malignant neoplasm of breast: Secondary | ICD-10-CM | POA: Diagnosis not present

## 2015-01-19 LAB — IPS PAP TEST WITH HPV

## 2015-02-27 ENCOUNTER — Ambulatory Visit: Payer: 59 | Admitting: Family Medicine

## 2015-03-31 ENCOUNTER — Ambulatory Visit: Payer: 59 | Admitting: Family Medicine

## 2015-04-05 ENCOUNTER — Ambulatory Visit (INDEPENDENT_AMBULATORY_CARE_PROVIDER_SITE_OTHER): Payer: 59 | Admitting: Family Medicine

## 2015-04-05 ENCOUNTER — Encounter: Payer: Self-pay | Admitting: Family Medicine

## 2015-04-05 VITALS — BP 122/72 | HR 67 | Ht 62.0 in | Wt 136.0 lb

## 2015-04-05 DIAGNOSIS — M7062 Trochanteric bursitis, left hip: Secondary | ICD-10-CM

## 2015-04-05 DIAGNOSIS — M7061 Trochanteric bursitis, right hip: Secondary | ICD-10-CM

## 2015-04-05 NOTE — Assessment & Plan Note (Signed)
Patient does have bursitis of the lateral hips. I do think this is been a slow process and is secondary to more muscle imbalances. We discussed over-the-counter medications in patient given a trial of topical anti-inflammatories. We discussed icing regimen and patient work with Event organiserathletic trainer to learn home exercises in greater detail. Patient will try these changes and come back in 3-4 weeks for further evaluation. If continuing to have difficulty we may need to consider injection and PT

## 2015-04-05 NOTE — Progress Notes (Signed)
Pre visit review using our clinic review tool, if applicable. No additional management support is needed unless otherwise documented below in the visit note. 

## 2015-04-05 NOTE — Patient Instructions (Signed)
Good to see you.  Ice 20 minutes 2 times daily. Usually after activity and before bed. Exercises 3 times a week.  Turmeric 500mg  twice daily pennsaid pinkie amount topically 2 times daily as needed.  Work on the core and hip abductors Good shoes and walk on softer surfaces when possible Alternate  Biking and elliptical into your routine See me again in 3-4 weeks.

## 2015-04-05 NOTE — Progress Notes (Signed)
[facility] [address] [place], [place] [address] Phone: [phone] Subjective:      CC: Bilateral hip pain  BJY:[id number] Sonia Kim is a 59 y.o. female coming in with complaint of bilateral hip pain. Patient is had this pain for approximately 6 months. Does not remember any true injury. Seems to be bilateral but may be left greater than right. Patient is an avid walker as well as a runner. Patient states that it's starting to affect even her running. States that the pain can be worse with activity or even after activity. Does have significant stiffness in the mornings. Nothing that seems to be radiating down the leg and denies any numbness. Rates the severity of pain a 6 out of 10. Has not taken anything for this pain. Has tried ice and heat with moderate benefit. Is not affecting daily activities.  Past Medical History  Diagnosis Date  . Anxiety   . Hyperlipidemia 05/2005  . Hypothyroid 06/2009  . Hypercholesteremia    Past Surgical History  Procedure Laterality Date  . Tubal ligation  1984 & 05/2005    reversal in 1995   Social History  Substance Use Topics  . Smoking status: Former Smoker -- 0.50 packs/day for 25 years    Quit date: 02/25/2005  . Smokeless tobacco: Never Used  . Alcohol Use: Yes     Comment: occ wine   No Known Allergies Family History  Problem Relation Age of Onset  . Thyroid disease Mother   . Lung cancer Mother   . CVA Father   . Diabetes Father         Past medical history, social, surgical and family history all reviewed in electronic medical record.   Review of Systems: No headache, visual changes, nausea, vomiting, diarrhea, constipation, dizziness, abdominal pain, skin rash, fevers, chills, night sweats, weight loss, swollen lymph nodes, body aches, joint swelling, muscle aches, chest pain, shortness of breath, mood changes.   Objective Blood pressure 122/72, pulse 67, height  (1.575 m), weight  136 lb (61.689 kg), last menstrual period 03/10/2010, SpO2 99 %.  General: No apparent distress alert and oriented x3 mood and affect normal, dressed appropriately.  HEENT: Pupils equal, extraocular movements intact  Respiratory: Patient's speak in full sentences and does not appear short of breath  Cardiovascular: No lower extremity edema, non tender, no erythema  Skin: Warm dry intact with no signs of infection or rash on extremities or on axial skeleton.  Abdomen: Soft nontender  Neuro: Cranial nerves II through XII are intact, neurovascularly intact in all extremities with 2+ DTRs and 2+ pulses.  Lymph: No lymphadenopathy of posterior or anterior cervical chain or axillae bilaterally.  Gait normal with good balance and coordination.  MSK:  Non tender with full range of motion and good stability and symmetric strength and tone of shoulders, elbows, wrist, , knee and ankles bilaterally.  Bilateral hip exam shows the patient does have full range of motion bilaterally. Patient does have some tightness with external rotation with pain on the lateral hip or positive Faber test on the left side. Mild to moderate tenderness to palpation over the lateral aspects of the hips. Negative straight leg test bilaterally. Full range of motion of the back. Neurovascularly intact distally. Full strength of the legs bilaterally. Deep tendon reflexes intact.  Procedure note 97110; 15 minutes spent for Therapeutic exercises as stated in above notes.  This included exercises focusing on stretching, strengthening, with significant  focus on eccentric aspects. Hip strengthening exercises which included:  Pelvic tilt/bracing to help with proper recruitment of the lower abs and pelvic floor muscles  Glute strengthening to properly contract glutes without over-engaging low back and hamstrings - prone hip extension and glute bridge exercises Proper stretching techniques to increase effectiveness for the hip flexors, groin,  quads, piriformis and low back when appropriate   Proper technique shown and discussed handout in great detail with ATC.  All questions were discussed and answered.     Impression and Recommendations:     This case required medical decision making of moderate complexity.

## 2015-04-21 ENCOUNTER — Other Ambulatory Visit: Payer: Self-pay | Admitting: Family Medicine

## 2015-04-21 DIAGNOSIS — R1011 Right upper quadrant pain: Secondary | ICD-10-CM

## 2015-04-26 ENCOUNTER — Ambulatory Visit: Payer: 59 | Admitting: Family Medicine

## 2015-05-02 ENCOUNTER — Ambulatory Visit: Payer: 59 | Admitting: Family Medicine

## 2015-05-10 ENCOUNTER — Encounter: Payer: Self-pay | Admitting: Family Medicine

## 2015-05-10 ENCOUNTER — Ambulatory Visit (INDEPENDENT_AMBULATORY_CARE_PROVIDER_SITE_OTHER): Payer: 59 | Admitting: Family Medicine

## 2015-05-10 VITALS — BP 132/76 | HR 75 | Ht 62.0 in | Wt 135.0 lb

## 2015-05-10 DIAGNOSIS — M7061 Trochanteric bursitis, right hip: Secondary | ICD-10-CM

## 2015-05-10 DIAGNOSIS — M7062 Trochanteric bursitis, left hip: Secondary | ICD-10-CM

## 2015-05-10 NOTE — Assessment & Plan Note (Signed)
Making improvement at this time. Continues to have pain on the left side. We'll refer to formal physical therapy to see if other modalities could be beneficial. Encourage her to continue the exercises. Patient and will come back and see me again in 4-6 weeks. At that time if continuing have pain we will consider injection.

## 2015-05-10 NOTE — Progress Notes (Signed)
Pre visit review using our clinic review tool, if applicable. No additional management support is needed unless otherwise documented below in the visit note. 

## 2015-05-10 NOTE — Progress Notes (Signed)
  [facility] D.O. [facility] [address] [place], [place] [address] Phone: [phone] Subjective:      CC: Bilateral hip pain follow up  VHQ:IONGEXBMWUHPI:Subjective Sonia LabrumDenise D Kim is a 59 y.o. female coming in with complaint of bilateral hip pain. Patient was found to have a greater trochanter bursitis bilaterally. Patient was to do home exercises, icing protocol, and stay active. We will going to focus on the hip abductors, patient states that she is approximately 75% better. Seems to be giving her trouble on the left side. Seems to be very intermittent. Patient did travel for the holidays and sitting for greater than 10 hour seem to cause exacerbation. Patient states that she's been doing the regimen on a more frequent basis though she has noticed improvement again.  Past Medical History  Diagnosis Date  . Anxiety   . Hyperlipidemia 05/2005  . Hypothyroid 06/2009  . Hypercholesteremia    Past Surgical History  Procedure Laterality Date  . Tubal ligation  1984 & 05/2005    reversal in 1995   Social History  Substance Use Topics  . Smoking status: Former Smoker -- 0.50 packs/day for 25 years    Quit date: 02/25/2005  . Smokeless tobacco: Never Used  . Alcohol Use: Yes     Comment: occ wine   No Known Allergies Family History  Problem Relation Age of Onset  . Thyroid disease Mother   . Lung cancer Mother   . CVA Father   . Diabetes Father         Past medical history, social, surgical and family history all reviewed in electronic medical record.   Review of Systems: No headache, visual changes, nausea, vomiting, diarrhea, constipation, dizziness, abdominal pain, skin rash, fevers, chills, night sweats, weight loss, swollen lymph nodes, body aches, joint swelling, muscle aches, chest pain, shortness of breath, mood changes.   Objective Blood pressure 132/76, pulse 75, height 5\' 2"  (1.575 m), weight 135 lb (61.236 kg), last menstrual period 03/10/2010, SpO2 97 %.    General: No apparent distress alert and oriented x3 mood and affect normal, dressed appropriately.  HEENT: Pupils equal, extraocular movements intact  Respiratory: Patient's speak in full sentences and does not appear short of breath  Cardiovascular: No lower extremity edema, non tender, no erythema  Skin: Warm dry intact with no signs of infection or rash on extremities or on axial skeleton.  Abdomen: Soft nontender  Neuro: Cranial nerves II through XII are intact, neurovascularly intact in all extremities with 2+ DTRs and 2+ pulses.  Lymph: No lymphadenopathy of posterior or anterior cervical chain or axillae bilaterally.  Gait normal with good balance and coordination.  MSK:  Non tender with full range of motion and good stability and symmetric strength and tone of shoulders, elbows, wrist, , knee and ankles bilaterally.  Bilateral hip exam shows the patient does have full range of motion bilaterally. Patient does have some tightness with external rotation with pain on the lateral hip or positive Faber test on the left side mild increase in range of motion from previous exam  Mildtenderness to palpation over the lateral aspects of the left hip. Negative straight leg test bilaterally. Full range of motion of the back. Neurovascularly intact distally. Full strength of the legs bilaterally. Deep tendon reflexes intact.      Impression and Recommendations:     This case required medical decision making of moderate complexity.

## 2015-05-10 NOTE — Patient Instructions (Signed)
Great to see you Sonia Kim is your friend after activity Continue the exercises and [facility] will be calling you.  Continue the vitamins See me again in 6 weeks Happy holidays!

## 2015-05-15 ENCOUNTER — Ambulatory Visit
Admission: RE | Admit: 2015-05-15 | Discharge: 2015-05-15 | Disposition: A | Discharge status: Home / Self Care | Payer: 59 | Source: Ambulatory Visit | Attending: Family Medicine | Admitting: Family Medicine

## 2015-05-15 DIAGNOSIS — R1011 Right upper quadrant pain: Secondary | ICD-10-CM

## 2015-06-21 ENCOUNTER — Ambulatory Visit: Payer: 59 | Admitting: Family Medicine

## 2015-07-07 ENCOUNTER — Ambulatory Visit: Payer: 59 | Admitting: Family Medicine

## 2015-08-28 ENCOUNTER — Encounter: Payer: Self-pay | Admitting: *Deleted

## 2015-08-31 ENCOUNTER — Other Ambulatory Visit: Payer: Self-pay | Admitting: *Deleted

## 2015-08-31 ENCOUNTER — Ambulatory Visit (INDEPENDENT_AMBULATORY_CARE_PROVIDER_SITE_OTHER): Payer: 59 | Admitting: Cardiology

## 2015-08-31 ENCOUNTER — Encounter: Payer: Self-pay | Admitting: Cardiology

## 2015-08-31 VITALS — BP 128/70 | HR 69 | Ht 62.0 in | Wt 137.0 lb

## 2015-08-31 DIAGNOSIS — I10 Essential (primary) hypertension: Secondary | ICD-10-CM

## 2015-08-31 DIAGNOSIS — R002 Palpitations: Secondary | ICD-10-CM | POA: Diagnosis not present

## 2015-08-31 DIAGNOSIS — R011 Cardiac murmur, unspecified: Secondary | ICD-10-CM

## 2015-08-31 HISTORY — DX: Essential (primary) hypertension: I10

## 2015-08-31 NOTE — Patient Instructions (Signed)
Medication Instructions:  Your physician recommends that you continue on your current medications as directed. Please refer to the Current Medication list given to you today.   Labwork: None  Testing/Procedures: Your physician has requested that you have an echocardiogram. Echocardiography is a painless test that uses sound waves to create images of your heart. It provides your doctor with information about the size and shape of your heart and how well your heart's chambers and valves are working. This procedure takes approximately one hour. There are no restrictions for this procedure.  Your physician has recommended that you wear an event monitor. Event monitors are medical devices that record the heart's electrical activity. Doctors most often us these monitors to diagnose arrhythmias. Arrhythmias are problems with the speed or rhythm of the heartbeat. The monitor is a small, portable device. You can wear one while you do your normal daily activities. This is usually used to diagnose what is causing palpitations/syncope (passing out).  Follow-Up: Your physician recommends that you schedule a follow-up appointment AS NEEDED with Dr. Turner pending study results.  Any Other Special Instructions Will Be Listed Below (If Applicable).     If you need a refill on your cardiac medications before your next appointment, please call your pharmacy.   

## 2015-08-31 NOTE — Progress Notes (Signed)
Cardiology Office Note   Date:  08/31/2015   ID:  Sonia LabrumDenise D Boza, DOB [birth year], MRN [id number]  PCP:  Ronnie DossWILLARD,JENNIFER, PA-C    Chief Complaint  Patient presents with  . Palpitations      History of Present Illness: Sonia Kim is a 60 y.o. female who presents for evaluation of palpitations.  She has a history of labile HTN, panic attacks, hypothyroidism, heart murmur in the past.  She says that she has a lot of anxiety.  She says that the palpitations are intermittent and she describes it as a feeling as a fluttering sensation.  She will be aware of her heart beat at night when sitting watching TV but no associated irregularity.  It occurs once weekly and usually lasts a few hours off and on.  She denies any dizziness or syncope.  She denies any chest pain, SOB, LE edema or claudication.      Past Medical History  Diagnosis Date  . Anxiety   . Hyperlipidemia 05/2005  . Hypothyroid 06/2009  . Hypercholesteremia   . Colon polyps   . Shingles 06/2011  . Heart murmur   . Chronic neck pain   . Benign essential HTN 08/31/2015    Past Surgical History  Procedure Laterality Date  . Tubal ligation  1984 & 05/2005    reversal in 1995  . Colonoscopy  2009  . Tubal reversal       Current Outpatient Prescriptions  Medication Sig Dispense Refill  . ALPRAZolam (XANAX) 0.5 MG tablet Take 0.5 mg by mouth as needed for anxiety.    Marland Kitchen. aspirin 81 MG tablet Take 81 mg by mouth daily.    Marland Kitchen. atorvastatin (LIPITOR) 20 MG tablet Take 20 mg by mouth daily at 6 PM.     . calcium carbonate (TUMS - DOSED IN MG ELEMENTAL CALCIUM) 500 MG chewable tablet Chew 1 tablet by mouth 3 (three) times daily as needed for indigestion or heartburn.    . cholecalciferol (VITAMIN D) 1000 units tablet Take by mouth daily. 2000 units &1000 units daily    . Cyanocobalamin (VITAMIN B-12 PO) Take 1 tablet by mouth daily.    Marland Kitchen. estradiol (ESTRACE) 0.5 MG tablet Take 1 tablet by mouth  daily  90 tablet 3  . fluticasone (FLONASE) 50 MCG/ACT nasal spray Place 2 sprays into both nostrils as needed for allergies.     Marland Kitchen. levothyroxine (SYNTHROID, LEVOTHROID) 50 MCG tablet Take 50 mcg by mouth daily before breakfast.    . lisinopril (PRINIVIL,ZESTRIL) 10 MG tablet Take 10 mg by mouth daily.  5  . medroxyPROGESTERone (PROVERA) 5 MG tablet Take 1 tablet by mouth  daily 90 tablet 3  . omeprazole (PRILOSEC) 20 MG capsule Take 20 mg by mouth daily.    . sertraline (ZOLOFT) 25 MG tablet Take 25 mg by mouth daily.    . valsartan (DIOVAN) 80 MG tablet Take 80 mg by mouth daily.     No current facility-administered medications for this visit.    Allergies:   Review of patient's allergies indicates no known allergies.    Social History:  The patient  reports that she quit smoking about 10 years ago. She has never used smokeless tobacco. She reports that she drinks alcohol. She reports that she does not use illicit drugs.   Family History:  The patient's family history includes CVA in her father and paternal grandfather;  Diabetes in her father; Lung cancer in her mother; Thyroid disease in her mother.    ROS:  Please see the history of present illness.   Otherwise, review of systems are positive for none.   All other systems are reviewed and negative.    PHYSICAL EXAM: VS:  BP 128/70 mmHg  Pulse 69  Ht  (1.575 m)  Wt 137 lb (62.143 kg)  BMI 25.05 kg/m2  LMP 03/10/2010 , BMI Body mass index is 25.05 kg/(m^2). GEN: Well nourished, well developed, in no acute distress HEENT: normal Neck: no JVD, carotid bruits, or masses Cardiac: RRR; no rubs, or gallops,no edema.  1/6 SM at RUSB only heard sitting upright Respiratory:  clear to auscultation bilaterally, normal work of breathing GI: soft, nontender, nondistended, + BS MS: no deformity or atrophy Skin: warm and dry, no rash Neuro:  Strength and sensation are intact Psych: euthymic mood, full affect   EKG:  EKG is ordered  today. The ekg ordered today demonstrates NSR at 69bpm with no ST changes   Recent Labs: No results found for requested labs within last 365 days.    Lipid Panel No results found for: CHOL, TRIG, HDL, CHOLHDL, VLDL, LDLCALC, LDLDIRECT    Wt Readings from Last 3 Encounters:  08/31/15 137 lb (62.143 kg)  05/10/15 135 lb (61.236 kg)  04/05/15 136 lb (61.689 kg)        ASSESSMENT AND PLAN:  1.  Palpitations - I suspect this is anxiety related but will get a 30day event monitor to make sure she is not having PAF. 2.  Heart murmur - I will get a 2D echo to evaluate but suspect this is AV sclerosis. 3.  HTN - controlled on Diovan   Current medicines are reviewed at length with the patient today.  The patient does not have concerns regarding medicines.  The following changes have been made:  no change  Labs/ tests ordered today: See above Assessment and Plan No orders of the defined types were placed in this encounter.     Disposition:   FU with me PRN   Signed, Quintella Reichert, MD  08/31/2015 10:00 AM    [facility] [address] [place], [place], [place]  [address] Phone: [phone]; Fax: [phone]

## 2015-08-31 NOTE — Addendum Note (Signed)
Addended by: Arcola JanskyOOK, Rylee Nuzum M on: 08/31/2015 03:04 PM   Modules accepted: Medications

## 2015-09-04 ENCOUNTER — Ambulatory Visit (INDEPENDENT_AMBULATORY_CARE_PROVIDER_SITE_OTHER): Payer: 59

## 2015-09-04 DIAGNOSIS — R002 Palpitations: Secondary | ICD-10-CM | POA: Diagnosis not present

## 2015-09-11 ENCOUNTER — Telehealth: Payer: Self-pay | Admitting: Cardiology

## 2015-09-11 NOTE — Telephone Encounter (Signed)
Left message for patient it is OK to take off monitor while on vacation, but to call number provided with monitor and let them know dates she will take it off. Instructed her to put monitor back on when she returns from vacation. Instructed her to call back with further questions or concerns.

## 2015-09-11 NOTE — Telephone Encounter (Signed)
Mr. Lalla BrothersZeleski is calling because she is leaving for vacation on Saturday . Wants to know can she take it off . Please call   Thanks

## 2015-09-15 ENCOUNTER — Telehealth: Payer: Self-pay | Admitting: Cardiology

## 2015-09-15 NOTE — Telephone Encounter (Signed)
Calling stating the electrodes on her heart monitor are irritating her skin.  She has called [employer] and has the sensitive electrodes.  Also she is going on vacation for a week and will not be wearing for a week.  Advised to let [employer] be aware that she will be taking the electrodes off for a week. She states they are aware.  She is also wanting to know why she has to wear for a month.  Advised Dr. Mayford Knifeurner wanted her to wear to see if she was having episodes of Afib.  She will notify [employer] when she starts to wear again. Will advise Theodoro KosKaty Bowman that she will not be wearing for a week.

## 2015-09-15 NOTE — Telephone Encounter (Signed)
New message  Pt called. Req a call back to discuss the monitor. She says that electros are giving her a rash she has received the electros for sensitive skin. Request a call back to discuss the monitor in itself. Pt says she doesn't really want to wear it anymore .

## 2015-09-25 ENCOUNTER — Telehealth: Payer: Self-pay | Admitting: Cardiology

## 2015-09-25 ENCOUNTER — Ambulatory Visit (HOSPITAL_COMMUNITY): Payer: 59

## 2015-09-25 NOTE — Telephone Encounter (Signed)
New Message:  Pt called in stating that she has been wearing the [employer] monitor for 2 wks and she would like to know if any readings have come thru that she should be concerned about. Please f/u with her

## 2015-09-25 NOTE — Telephone Encounter (Signed)
Informed patient that there are no documented critical notifications and confirmed with her no one has tried to report any to her. Informed patient she will be notified with results after she sends back monitor and Dr. Mayford Knifeurner has reviewed results. She was grateful for call.

## 2015-09-27 ENCOUNTER — Other Ambulatory Visit (HOSPITAL_COMMUNITY): Payer: 59

## 2015-10-03 ENCOUNTER — Telehealth: Payer: Self-pay | Admitting: Cardiology

## 2015-10-03 NOTE — Telephone Encounter (Signed)
Patient has cancel Echo which was schedule for 09-25-15.  Spoke with patient to see if she wanted to reschedule test.  She stated that she wanted to wait til after she get the results of the event monitor.   I will defer order for Echo.

## 2015-10-10 NOTE — Telephone Encounter (Signed)
-----   Message from Quintella Reichertraci R Turner, MD sent at 10/07/2015 11:29 PM EDT ----- Normal heart monitor

## 2015-10-10 NOTE — Telephone Encounter (Signed)
Informed patient of results and verbal understanding expressed.  Encouraged patient to schedule ECHO so Dr. Mayford Knifeurner may assess possible cause for murmur. She st she will call back to schedule.

## 2015-11-20 ENCOUNTER — Ambulatory Visit: Payer: 59 | Admitting: Family Medicine

## 2015-11-20 DIAGNOSIS — Z0289 Encounter for other administrative examinations: Secondary | ICD-10-CM

## 2015-12-11 ENCOUNTER — Ambulatory Visit (HOSPITAL_COMMUNITY): Payer: 59

## 2015-12-27 ENCOUNTER — Other Ambulatory Visit: Payer: Self-pay | Admitting: Nurse Practitioner

## 2015-12-27 ENCOUNTER — Other Ambulatory Visit: Payer: Self-pay

## 2015-12-27 ENCOUNTER — Ambulatory Visit (HOSPITAL_COMMUNITY): Payer: 59 | Attending: Cardiovascular Disease

## 2015-12-27 ENCOUNTER — Encounter (INDEPENDENT_AMBULATORY_CARE_PROVIDER_SITE_OTHER): Payer: Self-pay

## 2015-12-27 DIAGNOSIS — Z1231 Encounter for screening mammogram for malignant neoplasm of breast: Secondary | ICD-10-CM

## 2015-12-27 DIAGNOSIS — R011 Cardiac murmur, unspecified: Secondary | ICD-10-CM | POA: Diagnosis not present

## 2015-12-27 DIAGNOSIS — I10 Essential (primary) hypertension: Secondary | ICD-10-CM | POA: Insufficient documentation

## 2016-01-01 ENCOUNTER — Telehealth: Payer: Self-pay | Admitting: Cardiology

## 2016-01-01 NOTE — Telephone Encounter (Signed)
F/u  Pt returning RN phone call- echo results. Please call back and discuss.   

## 2016-01-01 NOTE — Telephone Encounter (Signed)
Aware of echo results. Results forwarded to dr Paulino Rily at [place] per pt request.

## 2016-01-22 ENCOUNTER — Ambulatory Visit: Payer: 59 | Admitting: Nurse Practitioner

## 2016-01-23 ENCOUNTER — Ambulatory Visit
Admission: RE | Admit: 2016-01-23 | Discharge: 2016-01-23 | Disposition: A | Discharge status: Home / Self Care | Payer: 59 | Source: Ambulatory Visit | Attending: Nurse Practitioner | Admitting: Nurse Practitioner

## 2016-01-23 DIAGNOSIS — Z1231 Encounter for screening mammogram for malignant neoplasm of breast: Secondary | ICD-10-CM

## 2016-01-30 ENCOUNTER — Encounter: Payer: Self-pay | Admitting: Nurse Practitioner

## 2016-01-30 ENCOUNTER — Ambulatory Visit (INDEPENDENT_AMBULATORY_CARE_PROVIDER_SITE_OTHER): Payer: 59 | Admitting: Nurse Practitioner

## 2016-01-30 VITALS — BP 134/80 | HR 60 | Ht 62.0 in | Wt 136.0 lb

## 2016-01-30 DIAGNOSIS — Z0000 Encounter for general adult medical examination without abnormal findings: Secondary | ICD-10-CM

## 2016-01-30 DIAGNOSIS — E2839 Other primary ovarian failure: Secondary | ICD-10-CM | POA: Diagnosis not present

## 2016-01-30 DIAGNOSIS — Z01419 Encounter for gynecological examination (general) (routine) without abnormal findings: Secondary | ICD-10-CM

## 2016-01-30 DIAGNOSIS — E559 Vitamin D deficiency, unspecified: Secondary | ICD-10-CM | POA: Diagnosis not present

## 2016-01-30 LAB — POCT URINALYSIS DIPSTICK
Bilirubin, UA: NEGATIVE
Blood, UA: NEGATIVE
Glucose, UA: NEGATIVE
Ketones, UA: NEGATIVE
Leukocytes, UA: NEGATIVE
Nitrite, UA: NEGATIVE
Protein, UA: NEGATIVE
Urobilinogen, UA: NEGATIVE
pH, UA: 6.5

## 2016-01-30 MED ORDER — MEDROXYPROGESTERONE ACETATE 5 MG PO TABS: ORAL_TABLET | ORAL | 3 refills | Status: DC

## 2016-01-30 MED ORDER — ESTRADIOL 0.5 MG PO TABS: ORAL_TABLET | ORAL | 3 refills | Status: DC

## 2016-01-30 NOTE — Progress Notes (Signed)
60 y.o. [id number] Married  Caucasian Fe here for annual exam.   The only new problems is bursitis of both hips.  Other joint and muscular pain top of shoulder and back.  Has a lot of cough even after being taken off beta blocker in Feb.  She remains on HRT and wants to continue.  When tried to taper had a lot of vaso symptoms.  Patient's last menstrual period was 03/10/2010.          Sexually active: No.  The current method of family planning is tubal ligation and post menopausal status.    Exercising: Yes.    power walking everyday Smoker:  no  Health Maintenance: Pap: 01/16/15, Negative with neg HR HPV MMG: 01/23/16, Bi-Rads 1: negative Colonoscopy: 10/04/13, Normal, repeat in 5 years BMD:  Never TDaP:  2013 Hep C: completed with PCP HIV: discuss today Labs: PCP takes care of all labs, we follow Vit D  Urine: Negative    reports that she quit smoking about 10 years ago. She has a 12.50 pack-year smoking history. She has never used smokeless tobacco. She reports that she drinks alcohol. She reports that she does not use drugs.  Past Medical History:  Diagnosis Date  . Anxiety   . Benign essential HTN 08/31/2015  . Chronic neck pain   . Colon polyps   . Heart murmur   . Hypercholesteremia   . Hyperlipidemia 05/2005  . Hypothyroid 06/2009  . Shingles 06/2011    Past Surgical History:  Procedure Laterality Date  . COLONOSCOPY  2009  . TUBAL LIGATION  1984 & 05/2005   reversal in 1995  . tubal reversal      Current Outpatient Prescriptions  Medication Sig Dispense Refill  . ALPRAZolam (XANAX) 0.5 MG tablet Take 0.5 mg by mouth as needed for anxiety.    Marland Kitchen. aspirin 81 MG tablet Take 81 mg by mouth daily.    Marland Kitchen. atorvastatin (LIPITOR) 20 MG tablet Take 20 mg by mouth daily at 6 PM.     . calcium carbonate (TUMS - DOSED IN MG ELEMENTAL CALCIUM) 500 MG chewable tablet Chew 1 tablet by mouth 3 (three) times daily as needed for indigestion or heartburn.    . cholecalciferol (VITAMIN D) 1000  units tablet Take 1,000 Units by mouth daily.     . Cyanocobalamin (VITAMIN B-12 PO) Take 1 tablet by mouth daily.    Marland Kitchen. estradiol (ESTRACE) 0.5 MG tablet Take 1 tablet by mouth  daily 90 tablet 3  . fluticasone (FLONASE) 50 MCG/ACT nasal spray Place 2 sprays into both nostrils as needed for allergies.     Marland Kitchen. levothyroxine (SYNTHROID, LEVOTHROID) 50 MCG tablet Take 50 mcg by mouth daily before breakfast.    . lisinopril (PRINIVIL,ZESTRIL) 10 MG tablet Take 10 mg by mouth daily.  5  . medroxyPROGESTERone (PROVERA) 5 MG tablet Take 1 tablet by mouth  daily 90 tablet 3  . omeprazole (PRILOSEC) 20 MG capsule Take 20 mg by mouth daily.    . sertraline (ZOLOFT) 25 MG tablet Take 25 mg by mouth daily.    . valsartan (DIOVAN) 80 MG tablet Take 80 mg by mouth daily.     No current facility-administered medications for this visit.     Family History  Problem Relation Age of Onset  . Thyroid disease Mother   . Lung cancer Mother   . CVA Father   . Diabetes Father   . CVA Paternal Grandfather     ROS:  Pertinent items are noted in HPI.  Otherwise, a comprehensive ROS was negative.  Exam:   LMP 03/10/2010    Ht Readings from Last 3 Encounters:  08/31/15 5\' 2"  (1.575 m)  05/10/15 5\' 2"  (1.575 m)  04/05/15 5\' 2"  (1.575 m)    General appearance: alert, cooperative and appears stated age Head: Normocephalic, without obvious abnormality, atraumatic Neck: no adenopathy, supple, symmetrical, trachea midline and thyroid normal to inspection and palpation Lungs: clear to auscultation bilaterally Breasts: normal appearance, no masses or tenderness Heart: regular rate and rhythm Abdomen: soft, non-tender; no masses,  no organomegaly Extremities: extremities normal, atraumatic, no cyanosis or edema Skin: Skin color, texture, turgor normal. No rashes or lesions Lymph nodes: Cervical, supraclavicular, and axillary nodes normal. No abnormal inguinal nodes palpated Neurologic: Grossly  normal   Pelvic: External genitalia:  no lesions              Urethra:  normal appearing urethra with no masses, tenderness or lesions              Bartholin's and Skene's: normal                 Vagina: normal appearing vagina with normal color and discharge, no lesions              Cervix:               Pap taken: No. Bimanual Exam:  Uterus:  normal size, contour, position, consistency, mobility, non-tender              Adnexa: no mass, fullness, tenderness               Rectovaginal: Confirms               Anus:  normal sphincter tone, no lesions  Chaperone present: yes  A:  Well Woman with normal exam  Postmenopausal on HRT since 3/ 2012 History of PMB on HRT with negative eval on 03/26/2012             Hypothyroid              History of Vit d deficiency    P:   Reviewed health and wellness pertinent to exam  Pap smear not done  Mammogram is due 01/2017  Refill on Estrace 0.5 mg and Provera 5 mg daily -- she will try to taper again this winter  Counseled on risk of DVT,CVA, cancer, etc.  Counseled on breast self exam, mammography screening, adequate intake of calcium and vitamin D, diet and exercise, Kegel's exercises return annually or prn  An After Visit Summary was printed and given to the patient.

## 2016-01-30 NOTE — Patient Instructions (Addendum)

## 2016-01-30 NOTE — Progress Notes (Signed)
Reviewed personally.  M. Suzanne Keaira Whitehurst, MD.  

## 2016-01-31 LAB — VITAMIN D 25 HYDROXY (VIT D DEFICIENCY, FRACTURES): Vit D, 25-Hydroxy: 78 ng/mL (ref 30–100)

## 2016-04-08 NOTE — Progress Notes (Signed)
Tawana ScaleZach Ryder Man D.O. [facility] [address] [place], [place] [address] Phone: [phone] Subjective:     CC: Nerve issue  Sonia  [name] is a 60 y.o. female coming in with complaint of nervelike pain. Patient was seen one year ago for more of a greater trochanteric bursitis. Patient states  He has had this previously one year ago. States that this is somewhat different. Seems to be more radiating to left lower leg. Patient denies any weakness. States that it seems to be increasing frequency as well as severity. Patient rates the severity of 6 out of 10 but seems to be worsening. Uncomfortable at night but not waking her up at night. No trouble with daily activities but if she tries to increase her activities it seems to give her more painful after the activities themselves. No injury mild improvement with OTC meds.         Past Medical History:  Diagnosis Date  . Anxiety   . Benign essential HTN 08/31/2015  . Chronic neck pain   . Colon polyps   . Heart murmur   . Hypercholesteremia   . Hyperlipidemia 05/2005  . Hypothyroid 06/2009  . Shingles 06/2011   Past Surgical History:  Procedure Laterality Date  . COLONOSCOPY  2009  . TUBAL LIGATION  1984 & 05/2005   reversal in 1995  . tubal reversal     Social History   Social History  . Marital status: Married    Spouse name: N/A  . Number of children: 3  . Years of education: N/A   Social History Main Topics  . Smoking status: Former Smoker    Packs/day: 0.50    Years: 25.00    Quit date: 02/25/2005  . Smokeless tobacco: Never Used  . Alcohol use Yes     Comment: occ wine  . Drug use: No  . Sexual activity: Not Currently    Partners: Male   Other Topics Concern  . Not on file   Social History Narrative  . No narrative on file   No Known Allergies Family History  Problem Relation Age of Onset  . Thyroid disease Mother   . Lung cancer Mother   . CVA Father   . Diabetes Father   .  CVA Paternal Grandfather     Past medical history, social, surgical and family history all reviewed in electronic medical record.  No pertanent information unless stated regarding to the chief complaint.   Review of Systems: No headache, visual changes, nausea, vomiting, diarrhea, constipation, dizziness, abdominal pain, skin rash, fevers, chills, night sweats, weight loss, swollen lymph nodes, body aches, joint swelling, muscle aches, chest pain, shortness of breath, mood changes.   Objective  Last menstrual period 03/10/2010.  General: No apparent distress alert and oriented x3 mood and affect normal, dressed appropriately.  HEENT: Pupils equal, extraocular movements intact  Respiratory: Patient's speak in full sentences and does not appear short of breath  Cardiovascular: No lower extremity edema, non tender, no erythema  Skin: Warm dry intact with no signs of infection or rash on extremities or on axial skeleton.  Abdomen: Soft nontender  Neuro: Cranial nerves II through XII are intact, neurovascularly intact in all extremities with 2+ DTRs and 2+ pulses.  Lymph: No lymphadenopathy of posterior or anterior cervical chain or axillae bilaterally.  Gait normal with good balance and coordination.  MSK:  Non tender with full range of motion and good stability and symmetric strength and tone  of shoulders, elbows, wrist,  knee and ankles bilaterally.  Back Exam:  Inspection: Unremarkable  Motion: Flexion 40 deg, Extension 25 deg, Side Bending to 35 deg bilaterally,  Rotation to 25 deg bilaterally  SLR laying: Negative  XSLR laying: Negative  Palpable tenderness: TTP in left buttocks. Marland Kitchen. FABER: + left . Sensory change: Gross sensation intact to all lumbar and sacral dermatomes.  Reflexes: 2+ at both patellar tendons, 2+ at achilles tendons, Babinski's downgoing.  Strength at foot  Plantar-flexion: 5/5 Dorsi-flexion: 5/5 Eversion: 5/5 Inversion: 5/5  Leg strength  Quad: 5/5 Hamstring: 5/5  Hip flexor: 5/5 Hip abductors: 4/5  Gait unremarkable.  Procedure note  97110; 15 minutes spent for Therapeutic exercises as stated in above notes.  This included exercises focusing on stretching, strengthening, with significant focus on eccentric aspects. Piriformis Syndrome  Using an anatomical model, reviewed with the patient the structures involved and how they related to diagnosis. The patient indicated understanding.   The patient was given a handout from Dr. Ailene Ardsouzier's book "The Sports Medicine Patient Advisor" describing the anatomy and rehabilitation of the following condition: Piriformis Syndrome  Also given a handout with more extensive Piriformis stretching, hip flexor and abductor strengthening, ham stretching  Rec deep massage, explained self-massage with ball   Proper technique shown and discussed handout in great detail with ATC.  All questions were discussed and answered.      Impression and Recommendations:     This case required medical decision making of moderate complexity.      Note: This dictation was prepared with Dragon dictation along with smaller phrase technology. Any transcriptional errors that result from this process are unintentional.

## 2016-04-09 ENCOUNTER — Encounter: Payer: Self-pay | Admitting: Family Medicine

## 2016-04-09 ENCOUNTER — Ambulatory Visit (INDEPENDENT_AMBULATORY_CARE_PROVIDER_SITE_OTHER)
Admission: RE | Admit: 2016-04-09 | Discharge: 2016-04-09 | Disposition: A | Discharge status: Home / Self Care | Payer: 59 | Source: Ambulatory Visit | Attending: Family Medicine | Admitting: Family Medicine

## 2016-04-09 ENCOUNTER — Ambulatory Visit (INDEPENDENT_AMBULATORY_CARE_PROVIDER_SITE_OTHER): Payer: 59 | Admitting: Family Medicine

## 2016-04-09 VITALS — BP 124/72 | HR 68 | Ht 62.0 in | Wt 136.0 lb

## 2016-04-09 DIAGNOSIS — M5442 Lumbago with sciatica, left side: Secondary | ICD-10-CM | POA: Diagnosis not present

## 2016-04-09 DIAGNOSIS — G8929 Other chronic pain: Secondary | ICD-10-CM | POA: Diagnosis not present

## 2016-04-09 DIAGNOSIS — M5441 Lumbago with sciatica, right side: Secondary | ICD-10-CM | POA: Diagnosis not present

## 2016-04-09 DIAGNOSIS — S76312A Strain of muscle, fascia and tendon of the posterior muscle group at thigh level, left thigh, initial encounter: Secondary | ICD-10-CM

## 2016-04-09 MED ORDER — GABAPENTIN 100 MG PO CAPS: 100.0000 mg | ORAL_CAPSULE | Freq: Every day | ORAL | 3 refills | Status: DC

## 2016-04-09 NOTE — Patient Instructions (Addendum)
Good to see you.  Lets get xray today  Ice 20 minutes 2 times daily. Usually after activity and before bed. Tennis bal in back left pocket with sitting.  Gabapentin 100mg  at night Duexis 3 times daily for 3 days General: No apparent distress alert and oriented x3 mood and affect normal, dressed appropriately.  Exercises 3 times a week.  See me again in 4 weeks

## 2016-04-09 NOTE — Assessment & Plan Note (Signed)
New problem for patient. Patient's differential includes a potential for lumbar radiculopathy and x-rays of her lower back and pelvis was ordered today. I do believe the patient will do well with conservative therapy though. Patient work with Event organiserathletic trainer to learn home exercises in greater detail. Oral anti-inflammatories given for short course and gabapentin to help with the radicular symptoms. Follow-up again in 4 weeks for further evaluation. If worsening symptoms consider formal PT and maybe manipulation

## 2016-04-12 ENCOUNTER — Telehealth: Payer: Self-pay | Admitting: Family Medicine

## 2016-04-12 NOTE — Telephone Encounter (Signed)
Patient has questions about x rays.  Please follow up in regard.

## 2016-04-12 NOTE — Telephone Encounter (Signed)
Discussed results.

## 2016-04-15 ENCOUNTER — Other Ambulatory Visit: Payer: Self-pay | Admitting: *Deleted

## 2016-04-15 DIAGNOSIS — I700 Atherosclerosis of aorta: Secondary | ICD-10-CM

## 2016-04-23 ENCOUNTER — Ambulatory Visit
Admission: RE | Admit: 2016-04-23 | Discharge: 2016-04-23 | Disposition: A | Discharge status: Home / Self Care | Payer: 59 | Source: Ambulatory Visit | Attending: Family Medicine | Admitting: Family Medicine

## 2016-04-23 DIAGNOSIS — I700 Atherosclerosis of aorta: Secondary | ICD-10-CM

## 2016-05-07 NOTE — Progress Notes (Signed)
Sonia Kim D.O. [facility] [address] [place], [place] [address] Phone: [phone] Subjective:     CC: piriformis f/u   BJY:NWGNFAOZHYHPI:Subjective  Sonia LabrumDenise D Kim is a 60 y.o. female coming in with complaint of nervelike pain. Patient was found to have more by strain of the left piriformis muscle. Patient was to do home exercises, icing, and see how patient responds. Patient states No significant improvement. Not doing the home exercises regularly. Patient is taking the medications only intermittently.  Patient is complaining more of a significant amount of pain everywhere. States that it seems to be her right elbow, right hip, even her knees from time to time. Feels like it is just not like her. Feels like something is going on otherwise.   Patient did have x-rays 04/09/2016 that were independently visualized by me. Patient did have slight osteophytic changes of L4-L5. Otherwise unremarkable. Pelvic x-rays normal     Past Medical History:  Diagnosis Date  . Anxiety   . Benign essential HTN 08/31/2015  . Chronic neck pain   . Colon polyps   . Heart murmur   . Hypercholesteremia   . Hyperlipidemia 05/2005  . Hypothyroid 06/2009  . Shingles 06/2011   Past Surgical History:  Procedure Laterality Date  . COLONOSCOPY  2009  . TUBAL LIGATION  1984 & 05/2005   reversal in 1995  . tubal reversal     Social History   Social History  . Marital status: Married    Spouse name: N/A  . Number of children: 3  . Years of education: N/A   Social History Main Topics  . Smoking status: Former Smoker    Packs/day: 0.50    Years: 25.00    Quit date: 02/25/2005  . Smokeless tobacco: Never Used  . Alcohol use Yes     Comment: occ wine  . Drug use: No  . Sexual activity: Not Currently    Partners: Male   Other Topics Concern  . None   Social History Narrative  . None   No Known Allergies Family History  Problem Relation Age of Onset  . Thyroid disease Mother   . Lung  cancer Mother   . CVA Father   . Diabetes Father   . CVA Paternal Grandfather     Past medical history, social, surgical and family history all reviewed in electronic medical record.  No pertanent information unless stated regarding to the chief complaint.   Review of Systems: No headache, visual changes, nausea, vomiting, diarrhea, constipation, dizziness, abdominal pain, skin rash, fevers, chills, night sweats, weight loss, swollen lymph nodes,  chest pain, shortness of breath, mood changes.   Positive for body aches, joint swelling, muscle aches, Objective  Blood pressure 134/80, pulse 77, height 5\' 2"  (1.575 m), weight 136 lb (61.7 kg), last menstrual period 03/10/2010, SpO2 97 %.  Systems examined below as of 05/09/16 General: NAD A&O x3 mood, affect normal  HEENT: Pupils equal, extraocular movements intact no nystagmus Respiratory: not short of breath at rest or with speaking Cardiovascular: No lower extremity edema, non tender Skin: Warm dry intact with no signs of infection or rash on extremities or on axial skeleton. Abdomen: Soft nontender, no masses Neuro: Cranial nerves  intact, neurovascularly intact in all extremities with 2+ DTRs and 2+ pulses. Lymph: No lymphadenopathy appreciated today  Gait normal with good balance and coordination.  MSK: Non tender with full range of motion and good stability and symmetric strength and tone of shoulders,  elbows, wrist,  knee hips and ankles bilaterally.  Having more discomfort of the hips bilaterally. Uncomfortable even light palpation in multiple areas. Back Exam:  Inspection: Unremarkable  Motion: Flexion 40 deg, Extension 20 deg, Side Bending to 25 deg bilaterally,  Rotation to 25 deg bilaterally increasing tightness from previous exam SLR laying: Negative  XSLR laying: Negative  Palpable tenderness: Continued discomfort over the buttocks area in the piriformis muscle but now bilateral FABER: + left still present.  Sensory change:  Gross sensation intact to all lumbar and sacral dermatomes.  Reflexes: 2+ at both patellar tendons, 2+ at achilles tendons, Babinski's downgoing.  Strength at foot  Plantar-flexion: 5/5 Dorsi-flexion: 5/5 Eversion: 5/5 Inversion: 5/5  Leg strength  Quad: 5/5 Hamstring: 5/5 Hip flexor: 5/5 Hip abductors: 4+/5  Gait unremarkable.     Impression and Recommendations:     This case required medical decision making of moderate complexity.      Note: This dictation was prepared with Dragon dictation along with smaller phrase technology. Any transcriptional errors that result from this process are unintentional.

## 2016-05-09 ENCOUNTER — Other Ambulatory Visit (INDEPENDENT_AMBULATORY_CARE_PROVIDER_SITE_OTHER): Payer: 59

## 2016-05-09 ENCOUNTER — Encounter: Payer: Self-pay | Admitting: Family Medicine

## 2016-05-09 ENCOUNTER — Ambulatory Visit (INDEPENDENT_AMBULATORY_CARE_PROVIDER_SITE_OTHER): Payer: 59 | Admitting: Family Medicine

## 2016-05-09 VITALS — BP 134/80 | HR 77 | Ht 62.0 in | Wt 136.0 lb

## 2016-05-09 DIAGNOSIS — E559 Vitamin D deficiency, unspecified: Secondary | ICD-10-CM

## 2016-05-09 DIAGNOSIS — M2550 Pain in unspecified joint: Secondary | ICD-10-CM | POA: Diagnosis not present

## 2016-05-09 DIAGNOSIS — S76312D Strain of muscle, fascia and tendon of the posterior muscle group at thigh level, left thigh, subsequent encounter: Secondary | ICD-10-CM

## 2016-05-09 DIAGNOSIS — E039 Hypothyroidism, unspecified: Secondary | ICD-10-CM | POA: Diagnosis not present

## 2016-05-09 LAB — COMPREHENSIVE METABOLIC PANEL
ALT: 11 U/L (ref 0–35)
AST: 18 U/L (ref 0–37)
Albumin: 4.4 g/dL (ref 3.5–5.2)
Alkaline Phosphatase: 53 U/L (ref 39–117)
BUN: 15 mg/dL (ref 6–23)
CO2: 28 mEq/L (ref 19–32)
Calcium: 9.5 mg/dL (ref 8.4–10.5)
Chloride: 106 mEq/L (ref 96–112)
Creatinine, Ser: 0.81 mg/dL (ref 0.40–1.20)
GFR: 76.71 mL/min (ref >60.00)
Glucose, Bld: 125 mg/dL — ABNORMAL HIGH (ref 70–99)
Potassium: 4.2 mEq/L (ref 3.5–5.1)
Sodium: 143 mEq/L (ref 135–145)
Total Bilirubin: 0.6 mg/dL (ref 0.2–1.2)
Total Protein: 6.9 g/dL (ref 6.0–8.3)

## 2016-05-09 LAB — TSH: TSH: 2.46 u[IU]/mL (ref 0.35–4.50)

## 2016-05-09 LAB — IRON: Iron: 92 ug/dL (ref 42–145)

## 2016-05-09 LAB — VITAMIN D 25 HYDROXY (VIT D DEFICIENCY, FRACTURES): VITD: 43.14 ng/mL (ref 30.00–100.00)

## 2016-05-09 LAB — SEDIMENTATION RATE: Sed Rate: 10 mm/hr (ref 0–30)

## 2016-05-09 LAB — C-REACTIVE PROTEIN: CRP: 0.1 mg/dL — ABNORMAL LOW (ref 0.5–20.0)

## 2016-05-09 MED ORDER — MELOXICAM 15 MG PO TABS: 15.0000 mg | ORAL_TABLET | Freq: Every day | ORAL | 0 refills | Status: DC

## 2016-05-09 NOTE — Assessment & Plan Note (Signed)
No significant improvement but we will see how patient laboratory workup comes back before we do more specific treatment.

## 2016-05-09 NOTE — Patient Instructions (Signed)
Good to see you  Lets try something new  We will get labs t see if we can get to the root of the problem.  Also want you to take meloxicam daily for 10 days then as needed.  Try to limit the exercises for a little bit for now but stay active.  See me again in 3 weeks and lets see how we are doing and if we can focus on something then.  Happy holidays!

## 2016-05-09 NOTE — Assessment & Plan Note (Signed)
Checking labs today

## 2016-05-09 NOTE — Assessment & Plan Note (Signed)
Patient is having more of a polyarthralgia. Could be secondary to patient's thyroid, vitamin D deficiency, or some underlying potential depression. Patient has had some mild increase in stress recently she states. We will get labs to rule out any other inflammatory diseases that could be contributing. We will check patient's vitamin D as well. Patient will come back again in 2 weeks to see how patient response to the treatment of meloxicam as well.  Spent  25 minutes with patient face-to-face and had greater than 50% of counseling including as described above in assessment and plan.

## 2016-05-09 NOTE — Progress Notes (Unsigned)
Labs so far look good. Vit D, iron and all electrolytes are normal.  Awaiting some inflammatory markers but so far all good news.

## 2016-05-10 LAB — CYCLIC CITRUL PEPTIDE ANTIBODY, IGG: Cyclic Citrullin Peptide Ab: <16 Units

## 2016-05-10 LAB — RHEUMATOID FACTOR: Rhuematoid fact SerPl-aCnc: <14 IU/mL (ref <14)

## 2016-05-13 LAB — ANA: Anti Nuclear Antibody(ANA): POSITIVE — AB

## 2016-05-13 LAB — ANTI-NUCLEAR AB-TITER (ANA TITER): ANA Titer 1: 1:40 {titer} — ABNORMAL HIGH

## 2016-06-05 NOTE — Progress Notes (Signed)
Tawana ScaleZach Aniylah Avans D.O. [facility] [address] [place], [place] [address] Phone: [phone] Subjective:     CC: piriformis f/u   BJY:NWGNFAOZHYHPI:Subjective  Sonia LabrumDenise D Kim is a 60 y.o. female coming in with complaint of nervelike pain. Patient was found to have more by strain of the left piriformis muscle. Patient was to do home exercises, icing, and see how patient responds. Patient was not making significant progress and we did discuss possibly formal physical therapy. Patient states Buttocks pain is better. Still some mild tightness in the lower back but states that she is able to do more activities and she does he exercises fairly regularly.   Patient was also having polyarthralgia. Patient was sent for labs and did have a positive ANA with a one to 40 titer. . Otherwise labs are unremarkable. Patient was to do some over-the-counter natural supplementations. Patient states continues to have pain in multiple joints. Seems to be more of the neck recently. Denies any radiation down the arm but can have elbow pain on that side. Feels like they are independent though. Denies any weakness. Just states that she has soreness overall.   Patient did have x-rays 04/09/2016 that were independently visualized by me. Patient did have slight osteophytic changes of L4-L5. Otherwise unremarkable. Pelvic x-rays normal     Past Medical History:  Diagnosis Date  . Anxiety   . Benign essential HTN 08/31/2015  . Chronic neck pain   . Colon polyps   . Heart murmur   . Hypercholesteremia   . Hyperlipidemia 05/2005  . Hypothyroid 06/2009  . Shingles 06/2011   Past Surgical History:  Procedure Laterality Date  . COLONOSCOPY  2009  . TUBAL LIGATION  1984 & 05/2005   reversal in 1995  . tubal reversal     Social History   Social History  . Marital status: Married    Spouse name: N/A  . Number of children: 3  . Years of education: N/A   Social History Main Topics  . Smoking status: Former Smoker      Packs/day: 0.50    Years: 25.00    Quit date: 02/25/2005  . Smokeless tobacco: Never Used  . Alcohol use Yes     Comment: occ wine  . Drug use: No  . Sexual activity: Not Currently    Partners: Male   Other Topics Concern  . None   Social History Narrative  . None   No Known Allergies Family History  Problem Relation Age of Onset  . Thyroid disease Mother   . Lung cancer Mother   . CVA Father   . Diabetes Father   . CVA Paternal Grandfather     Past medical history, social, surgical and family history all reviewed in electronic medical record.  No pertanent information unless stated regarding to the chief complaint.   Review of Systems: No headache, visual changes, nausea, vomiting, diarrhea, constipation, dizziness, abdominal pain, skin rash, fevers, chills, night sweats, weight loss, swollen lymph nodes,  chest pain, shortness of breath, mood changes.   Positive for body aches, joint swelling, muscle aches, Objective  Blood pressure 132/84, pulse 68, height 5\' 2"  (1.575 m), weight 135 lb (61.2 kg), last menstrual period 03/10/2010, SpO2 98 %.  Systems examined below as of 06/06/16 General: NAD A&O x3 mood, affect normal  HEENT: Pupils equal, extraocular movements intact no nystagmus Respiratory: not short of breath at rest or with speaking Cardiovascular: No lower extremity edema, non tender Skin: Warm dry  intact with no signs of infection or rash on extremities or on axial skeleton. Abdomen: Soft nontender, no masses Neuro: Cranial nerves  intact, neurovascularly intact in all extremities with 2+ DTRs and 2+ pulses. Lymph: No lymphadenopathy appreciated today  Gait normal with good balance and coordination.  MSK: tender with full range of motion and good stability and symmetric strength and tone of shoulders, elbows, wrist,  knee hips and ankles bilaterally.   Back Exam:  Inspection: Unremarkable  Motion: Flexion 40 deg, Extension 20 deg, Side Bending to 25 deg  bilaterally,  Rotation to continued tenderness near full range of motion  Right leg test negative but tight hamstrings XSLR laying: Negative  Palpable tenderness: Continued discomfort over the buttocks area in the piriformis muscle but now bilateral FABER: Continued irritation with left-sided Pearlean BrownieFaber Sensory change: Gross sensation intact to all lumbar and sacral dermatomes.  Reflexes: 2+ at both patellar tendons, 2+ at achilles tendons, Babinski's downgoing.  Strength at foot  Plantar-flexion: 5/5 Dorsi-flexion: 5/5 Eversion: 5/5 Inversion: 5/5  Leg strength  Quad: 5/5 Hamstring: 5/5 Hip flexor: 5/5 Hip abductors: 4+/5  Gait unremarkable.  Osteopathic findings Cervical C2 flexed rotated and side bent right C4 flexed rotated and side bent left C6 flexed rotated and side bent left T3 extended rotated and side bent right inhaled third rib T9 extended rotated and side bent left L2 flexed rotated and side bent right Sacrum right on right    Impression and Recommendations:     This case required medical decision making of moderate complexity.      Note: This dictation was prepared with Dragon dictation along with smaller phrase technology. Any transcriptional errors that result from this process are unintentional.

## 2016-06-06 ENCOUNTER — Ambulatory Visit (INDEPENDENT_AMBULATORY_CARE_PROVIDER_SITE_OTHER): Payer: 59 | Admitting: Family Medicine

## 2016-06-06 ENCOUNTER — Encounter: Payer: Self-pay | Admitting: Family Medicine

## 2016-06-06 DIAGNOSIS — M999 Biomechanical lesion, unspecified: Secondary | ICD-10-CM

## 2016-06-06 DIAGNOSIS — M2550 Pain in unspecified joint: Secondary | ICD-10-CM | POA: Diagnosis not present

## 2016-06-06 DIAGNOSIS — S76312D Strain of muscle, fascia and tendon of the posterior muscle group at thigh level, left thigh, subsequent encounter: Secondary | ICD-10-CM | POA: Diagnosis not present

## 2016-06-06 MED ORDER — VENLAFAXINE HCL ER 37.5 MG PO CP24: 37.5000 mg | ORAL_CAPSULE | Freq: Every day | ORAL | 1 refills | Status: DC

## 2016-06-06 NOTE — Assessment & Plan Note (Signed)
Multifactorial. Patient has had difficulty with her thyroid, as well as some difficulty with depression in the past. Patient is on Zoloft but we'll change it to Effexor. We'll stay on low-dose secondary to some heart palpitations. We discussed icing regimen and home exercises. Discussed which activities to do in which ones to potentially avoid. We discussed what diet changes can be beneficial. Follow-up again in 3-4 weeks.

## 2016-06-06 NOTE — Assessment & Plan Note (Signed)
Decision today to treat with OMT was based on Physical Exam  After verbal consent patient was treated with HVLA, ME, FPR techniques in cervical, thoracic, lumbar and sacral areas  Patient tolerated the procedure well with improvement in symptoms  Patient given exercises, stretches and lifestyle modifications  See medications in patient instructions if given  Patient will follow up in 3-4 weeks  

## 2016-06-06 NOTE — Patient Instructions (Signed)
Good to see you.  Exercises 3 times a week.  ICe if you need it.  You should do well overall but will take time Decrease zoloft to 1/4 tab daily for 2 weeks then stop Start effexor 37.5 mg daily  See me again in 3 weeks and make sure you are doing better Happy New Year!

## 2016-06-06 NOTE — Assessment & Plan Note (Signed)
Improving at this time. No significant change in management. Discussed hip abductor strengthening again in greater detail. Follow-up again in 3-4 weeks.

## 2016-06-19 ENCOUNTER — Encounter: Payer: Self-pay | Admitting: General Practice

## 2016-06-27 ENCOUNTER — Ambulatory Visit: Payer: 59 | Admitting: Family Medicine

## 2016-07-11 DIAGNOSIS — R69 Illness, unspecified: Secondary | ICD-10-CM | POA: Diagnosis not present

## 2016-08-06 DIAGNOSIS — L820 Inflamed seborrheic keratosis: Secondary | ICD-10-CM | POA: Diagnosis not present

## 2016-08-14 ENCOUNTER — Ambulatory Visit (INDEPENDENT_AMBULATORY_CARE_PROVIDER_SITE_OTHER): Payer: 59 | Admitting: Family Medicine

## 2016-08-14 ENCOUNTER — Encounter: Payer: Self-pay | Admitting: Family Medicine

## 2016-08-14 VITALS — BP 120/82 | HR 65 | Temp 99.1°F | Resp 16 | Ht 62.0 in | Wt 135.0 lb

## 2016-08-14 DIAGNOSIS — E785 Hyperlipidemia, unspecified: Secondary | ICD-10-CM | POA: Diagnosis not present

## 2016-08-14 DIAGNOSIS — E039 Hypothyroidism, unspecified: Secondary | ICD-10-CM | POA: Diagnosis not present

## 2016-08-14 DIAGNOSIS — F419 Anxiety disorder, unspecified: Secondary | ICD-10-CM | POA: Diagnosis not present

## 2016-08-14 LAB — CBC WITH DIFFERENTIAL/PLATELET
Basophils Absolute: 0.1 10*3/uL (ref 0.0–0.1)
Basophils Relative: 0.7 % (ref 0.0–3.0)
Eosinophils Absolute: 0.2 10*3/uL (ref 0.0–0.7)
Eosinophils Relative: 2.8 % (ref 0.0–5.0)
HCT: 41.5 % (ref 36.0–46.0)
Hemoglobin: 14.1 g/dL (ref 12.0–15.0)
Lymphocytes Relative: 21.8 % (ref 12.0–46.0)
Lymphs Abs: 1.8 10*3/uL (ref 0.7–4.0)
MCHC: 33.9 g/dL (ref 30.0–36.0)
MCV: 94.2 fl (ref 78.0–100.0)
Monocytes Absolute: 0.5 10*3/uL (ref 0.1–1.0)
Monocytes Relative: 6.1 % (ref 3.0–12.0)
Neutro Abs: 5.8 10*3/uL (ref 1.4–7.7)
Neutrophils Relative %: 68.6 % (ref 43.0–77.0)
Platelets: 215 10*3/uL (ref 150.0–400.0)
RBC: 4.4 Mil/uL (ref 3.87–5.11)
RDW: 12.9 % (ref 11.5–15.5)
WBC: 8.4 10*3/uL (ref 4.0–10.5)

## 2016-08-14 LAB — BASIC METABOLIC PANEL
BUN: 15 mg/dL (ref 6–23)
CO2: 29 mEq/L (ref 19–32)
Calcium: 9.6 mg/dL (ref 8.4–10.5)
Chloride: 106 mEq/L (ref 96–112)
Creatinine, Ser: 0.68 mg/dL (ref 0.40–1.20)
GFR: 93.79 mL/min (ref >60.00)
Glucose, Bld: 118 mg/dL — ABNORMAL HIGH (ref 70–99)
Potassium: 4.2 mEq/L (ref 3.5–5.1)
Sodium: 143 mEq/L (ref 135–145)

## 2016-08-14 LAB — TSH: TSH: 2.13 u[IU]/mL (ref 0.35–4.50)

## 2016-08-14 LAB — LIPID PANEL
Cholesterol: 183 mg/dL (ref 0–200)
HDL: 46.8 mg/dL (ref >39.00)
NonHDL: 136.33
Total CHOL/HDL Ratio: 4
Triglycerides: 226 mg/dL — ABNORMAL HIGH (ref 0.0–149.0)
VLDL: 45.2 mg/dL — ABNORMAL HIGH (ref 0.0–40.0)

## 2016-08-14 LAB — HEPATIC FUNCTION PANEL
ALT: 10 U/L (ref 0–35)
AST: 18 U/L (ref 0–37)
Albumin: 4.4 g/dL (ref 3.5–5.2)
Alkaline Phosphatase: 56 U/L (ref 39–117)
Bilirubin, Direct: 0.1 mg/dL (ref 0.0–0.3)
Total Bilirubin: 0.4 mg/dL (ref 0.2–1.2)
Total Protein: 6.7 g/dL (ref 6.0–8.3)

## 2016-08-14 LAB — LDL CHOLESTEROL, DIRECT: Direct LDL: 106 mg/dL

## 2016-08-14 MED ORDER — LEVOTHYROXINE SODIUM 50 MCG PO TABS: 50.0000 ug | ORAL_TABLET | Freq: Every day | ORAL | 1 refills | Status: DC

## 2016-08-14 MED ORDER — SERTRALINE HCL 25 MG PO TABS: 25.0000 mg | ORAL_TABLET | Freq: Every day | ORAL | 1 refills | Status: DC

## 2016-08-14 NOTE — Progress Notes (Signed)
   Subjective:    Patient ID: Curlene Labrumenise D Stankowski, female    DOB: May 30, 1956, 61 y.o.   MRN: [id number]  HPI New to establish.  Previous MD- Etheleen NicksWillard, [place]  Hyperlipidemia- chronic problem, on Crestor daily.  Denies CP, SOB, HAs, visual changes, abd pain, N/V, mylagias.  Exercising regularly- 4 miles/day.    Hypothyroid- chronic problem, on Levothyroxine daily.  Some fatigue.  No constipation.  + dry skin.  No changes to hair/nails.  Anxiety- chronic problem, on Zoloft daily and Alprazolam prn.  sxs are well controlled on the 25mg  Zoloft.   Review of Systems For ROS see HPI     Objective:   Physical Exam  Constitutional: She is oriented to person, place, and time. She appears well-developed and well-nourished. No distress.  HENT:  Head: Normocephalic and atraumatic.  Eyes: Conjunctivae and EOM are normal. Pupils are equal, round, and reactive to light.  Neck: Normal range of motion. Neck supple. No thyromegaly present.  Cardiovascular: Normal rate, regular rhythm, normal heart sounds and intact distal pulses.   No murmur heard. Pulmonary/Chest: Effort normal and breath sounds normal. No respiratory distress.  Abdominal: Soft. She exhibits no distension. There is no tenderness.  Musculoskeletal: She exhibits no edema.  Lymphadenopathy:    She has no cervical adenopathy.  Neurological: She is alert and oriented to person, place, and time.  Skin: Skin is warm and dry.  Psychiatric: She has a normal mood and affect. Her behavior is normal.  Vitals reviewed.         Assessment & Plan:

## 2016-08-14 NOTE — Patient Instructions (Signed)
Schedule your complete physical in 6 months We'll notify you of your lab results and make any changes if needed Keep up the good work on healthy diet and regular exercise- you look great!!! Call with any questions or concerns Welcome!  We're glad to have you!!! 

## 2016-08-14 NOTE — Assessment & Plan Note (Signed)
New to provider, ongoing for pt.  Well controlled on Zoloft.  Rarely taking alprazolam.  Refill provided.

## 2016-08-14 NOTE — Assessment & Plan Note (Signed)
New to provider, ongoing for pt.  Tolerating Crestor w/o difficulty.  Stressed need for healthy diet and regular exercise.  Check labs.  Adjust meds prn

## 2016-08-14 NOTE — Progress Notes (Signed)
Pre visit review using our clinic review tool, if applicable. No additional management support is needed unless otherwise documented below in the visit note. 

## 2016-08-14 NOTE — Assessment & Plan Note (Signed)
New to provider, ongoing for pt.  Currently asymptomatic w/ exception of dry skin and mild fatigue.  Check labs.  Adjust meds prn

## 2016-08-15 ENCOUNTER — Other Ambulatory Visit (INDEPENDENT_AMBULATORY_CARE_PROVIDER_SITE_OTHER): Payer: 59

## 2016-08-15 DIAGNOSIS — R7309 Other abnormal glucose: Secondary | ICD-10-CM

## 2016-08-15 LAB — HEMOGLOBIN A1C: Hgb A1c MFr Bld: 5.9 % (ref 4.6–6.5)

## 2016-09-03 ENCOUNTER — Ambulatory Visit: Payer: 59 | Admitting: Family Medicine

## 2016-09-13 ENCOUNTER — Telehealth: Payer: Self-pay | Admitting: *Deleted

## 2016-09-13 NOTE — Telephone Encounter (Signed)
Patient called and stated that she needed to talk with someone regarding her bill because it was ridiculous that it was so high.  I advised patient that I could get her the number to the billing department where they could assist her with her concerns.  She got upset and stated "What? So I call the doctors office with a bill questions and now I have to do my own homework and research too?"  I explained that I did not have access to the bill, and that the billing department would be the best people to talk to.  She stated "Just give me the number"   I gave her the number and she hung up on me.   Patient called back a couple of hours later very upset and yelling and stated that she did not understand why Dr. Beverely Low billed so much.  She said that it was ridiculous to charge a patient so much.   She then asked me where I sent her lab work when she was here.  I explained that during the day our labs are sent to the in house [facility] - she asked me why..... I told her that was the lab we regularly used unless a patient told us otherwise.    She was not happy and stated that it should not be up to her to tell me, that I was pathetic for not asking her.   I advised that we could not know every patient's insurance details, and she stated that we should, and that this was pathetic and our office was pathetic for not knowing all these details and that we should not expect patients to do this. She told me that I was to send the orders to [facility] and bill it through them.  I explained that this service was a month ago, and that we could not bill [facility] for labs that were not sent to them, that they had already been processed through [facility], and that the blood would not be good to send to [facility] after a month.  Her reply was "YOU ARE WRONG WRONG WRONG FOR THIS"  She went back to saying that this office was ridiculous for billing so much.   She stated that the next time we had an office staff meeting that I needed to  bring up this situation and let them know just how bad it is to expect this to be patient responsibility.     I asked her if she would like to speak with management about her concerns and she responded with "YES! Tell them to call me!" and she hung up the phone.   During the course of our conversation she would cut me off when I would try to explain anything, at one point I went to answer and she stated that I couldn't say anything.  She was very upset and yelling for the entire conversation.

## 2016-09-15 ENCOUNTER — Encounter: Payer: Self-pay | Admitting: Family Medicine

## 2016-09-16 MED ORDER — ROSUVASTATIN CALCIUM 5 MG PO TABS: 5.0000 mg | ORAL_TABLET | Freq: Every day | ORAL | 1 refills | Status: DC

## 2016-09-17 ENCOUNTER — Ambulatory Visit: Payer: 59 | Admitting: Family Medicine

## 2016-09-19 DIAGNOSIS — M47812 Spondylosis without myelopathy or radiculopathy, cervical region: Secondary | ICD-10-CM | POA: Diagnosis not present

## 2016-09-19 DIAGNOSIS — M47816 Spondylosis without myelopathy or radiculopathy, lumbar region: Secondary | ICD-10-CM | POA: Diagnosis not present

## 2016-09-19 DIAGNOSIS — M9903 Segmental and somatic dysfunction of lumbar region: Secondary | ICD-10-CM | POA: Diagnosis not present

## 2016-09-20 DIAGNOSIS — M47816 Spondylosis without myelopathy or radiculopathy, lumbar region: Secondary | ICD-10-CM | POA: Diagnosis not present

## 2016-09-20 DIAGNOSIS — M9903 Segmental and somatic dysfunction of lumbar region: Secondary | ICD-10-CM | POA: Diagnosis not present

## 2016-09-20 DIAGNOSIS — M47812 Spondylosis without myelopathy or radiculopathy, cervical region: Secondary | ICD-10-CM | POA: Diagnosis not present

## 2016-09-24 DIAGNOSIS — M9903 Segmental and somatic dysfunction of lumbar region: Secondary | ICD-10-CM | POA: Diagnosis not present

## 2016-09-24 DIAGNOSIS — M47816 Spondylosis without myelopathy or radiculopathy, lumbar region: Secondary | ICD-10-CM | POA: Diagnosis not present

## 2016-09-24 DIAGNOSIS — M47812 Spondylosis without myelopathy or radiculopathy, cervical region: Secondary | ICD-10-CM | POA: Diagnosis not present

## 2016-09-26 DIAGNOSIS — M9903 Segmental and somatic dysfunction of lumbar region: Secondary | ICD-10-CM | POA: Diagnosis not present

## 2016-09-26 DIAGNOSIS — M47816 Spondylosis without myelopathy or radiculopathy, lumbar region: Secondary | ICD-10-CM | POA: Diagnosis not present

## 2016-09-26 DIAGNOSIS — M47812 Spondylosis without myelopathy or radiculopathy, cervical region: Secondary | ICD-10-CM | POA: Diagnosis not present

## 2016-10-03 ENCOUNTER — Ambulatory Visit: Payer: 59 | Admitting: Family Medicine

## 2016-10-07 ENCOUNTER — Ambulatory Visit (INDEPENDENT_AMBULATORY_CARE_PROVIDER_SITE_OTHER): Payer: 59 | Admitting: Family Medicine

## 2016-10-07 ENCOUNTER — Encounter: Payer: Self-pay | Admitting: Family Medicine

## 2016-10-07 VITALS — BP 121/81 | HR 76 | Temp 98.0°F | Resp 17 | Ht 62.0 in | Wt 136.2 lb

## 2016-10-07 DIAGNOSIS — M5417 Radiculopathy, lumbosacral region: Secondary | ICD-10-CM | POA: Diagnosis not present

## 2016-10-07 MED ORDER — MELOXICAM 15 MG PO TABS: 15.0000 mg | ORAL_TABLET | Freq: Every day | ORAL | 1 refills | Status: DC

## 2016-10-07 NOTE — Progress Notes (Signed)
   Subjective:    Patient ID: Sonia Kim, female    DOB: [birth year], 61 y.o.   MRN: [id number]  HPI Back pain- ongoing issue for pt.  She has seen Dr Katrinka Blazing (since 2016), completed PT, and has seen Chiropractor.  Pt reports pain is in low back and hips bilaterally.  Pt reports 2 months ago was leaning forward when she developed 'excrutiating pain' and inability to lift R leg.  She reports this improved after 'massive amounts of anti-inflammatories'.  Pt reports she feels pain and pressure with sitting- particularly in tail bone.  Pain will also radiate down L leg.  Pt has had xrays that show mild arthritic changes at L4-5 but otherwise normal.  Pelvis xray normal.     Review of Systems For ROS see HPI     Objective:   Physical Exam  Constitutional: She is oriented to person, place, and time. She appears well-developed and well-nourished.  HENT:  Head: Normocephalic and atraumatic.  Cardiovascular: Intact distal pulses.   Musculoskeletal:  Pain w/ sitting in office- must change positions frequently Pain w/ standing for walking for any period of time  Neurological: She is alert and oriented to person, place, and time.  Skin: Skin is warm and dry.  Psychiatric: She has a normal mood and affect. Her behavior is normal. Thought content normal.  Vitals reviewed.         Assessment & Plan:  Lumbar radiculopathy- new to provider, ongoing for pt.  She has exhausted her conservative options- sports med, PT, Chiropractic care.  Will refer to Ortho for complete evaluation and imaging- likely MRI- as recent xrays were unremarkable.  Start daily Mobic.  Reviewed supportive care and red flags that should prompt return.  Pt expressed understanding and is in agreement w/ plan.

## 2016-10-07 NOTE — Progress Notes (Signed)
Pre visit review using our clinic review tool, if applicable. No additional management support is needed unless otherwise documented below in the visit note. 

## 2016-10-07 NOTE — Patient Instructions (Signed)
Follow up as needed/scheduled Start the once daily meloxicam- take w/ food Do not take any additional ibuprofen, motrin, aleve, etc- you can add Tylenol as needed for breakthrough pain Take daily tumeric, fish oil, and Vit D to improve joint pain Call with any questions or concerns Hang in there!!

## 2016-10-15 DIAGNOSIS — M5416 Radiculopathy, lumbar region: Secondary | ICD-10-CM | POA: Diagnosis not present

## 2016-10-15 DIAGNOSIS — M545 Low back pain: Secondary | ICD-10-CM | POA: Diagnosis not present

## 2016-10-17 ENCOUNTER — Encounter: Payer: Self-pay | Admitting: Family Medicine

## 2016-10-17 MED ORDER — FLUTICASONE PROPIONATE 50 MCG/ACT NA SUSP: 2.0000 | Freq: Every day | NASAL | 1 refills | Status: DC

## 2016-11-14 DIAGNOSIS — H04123 Dry eye syndrome of bilateral lacrimal glands: Secondary | ICD-10-CM | POA: Diagnosis not present

## 2016-11-18 DIAGNOSIS — H04123 Dry eye syndrome of bilateral lacrimal glands: Secondary | ICD-10-CM | POA: Diagnosis not present

## 2016-11-20 DIAGNOSIS — H1852 Epithelial (juvenile) corneal dystrophy: Secondary | ICD-10-CM | POA: Diagnosis not present

## 2016-11-20 DIAGNOSIS — H2513 Age-related nuclear cataract, bilateral: Secondary | ICD-10-CM | POA: Diagnosis not present

## 2016-12-17 ENCOUNTER — Telehealth: Payer: Self-pay | Admitting: Obstetrics & Gynecology

## 2016-12-17 NOTE — Telephone Encounter (Signed)
Left message on voicemail to call and reschedule cancelled appointment. Mail letter °

## 2016-12-19 DIAGNOSIS — H52212 Irregular astigmatism, left eye: Secondary | ICD-10-CM | POA: Diagnosis not present

## 2016-12-19 DIAGNOSIS — H40013 Open angle with borderline findings, low risk, bilateral: Secondary | ICD-10-CM | POA: Diagnosis not present

## 2017-01-27 ENCOUNTER — Other Ambulatory Visit: Payer: Self-pay | Admitting: Family Medicine

## 2017-01-28 DIAGNOSIS — H40013 Open angle with borderline findings, low risk, bilateral: Secondary | ICD-10-CM | POA: Diagnosis not present

## 2017-01-31 ENCOUNTER — Ambulatory Visit: Payer: 59 | Admitting: Certified Nurse Midwife

## 2017-01-31 ENCOUNTER — Ambulatory Visit: Payer: 59 | Admitting: Nurse Practitioner

## 2017-02-06 ENCOUNTER — Other Ambulatory Visit: Payer: Self-pay | Admitting: Certified Nurse Midwife

## 2017-02-06 ENCOUNTER — Telehealth: Payer: Self-pay | Admitting: Certified Nurse Midwife

## 2017-02-06 DIAGNOSIS — Z1239 Encounter for other screening for malignant neoplasm of breast: Secondary | ICD-10-CM

## 2017-02-06 NOTE — Telephone Encounter (Signed)
Spoke with patient. Reports left breast pain and  Inflammation at nipple. Has noticed for the last couple of days, is concerned because she takes estrogen and due for screening MMG. Request OV for further evaluation. Patient scheduled for 02/07/17 at 9:30am with Sonia Kim, CNM. Patient is agreeable to date and time.  Last AEX 01/30/16 -Sonia CommentPatricia Grubb, NP   Routing to provider for final review. Patient is agreeable to disposition. Will close encounter.

## 2017-02-06 NOTE — Telephone Encounter (Signed)
Patient would like to speak to a nurse about getting a order for diagnostic mammogram

## 2017-02-07 ENCOUNTER — Telehealth: Payer: Self-pay | Admitting: Certified Nurse Midwife

## 2017-02-07 ENCOUNTER — Ambulatory Visit: Payer: Self-pay | Admitting: Certified Nurse Midwife

## 2017-02-07 NOTE — Telephone Encounter (Signed)
Patient canceled her appointment for "breast pain" today. Patient said her car battery is dead and will not be able to make it today. I asked patient if she would like to reschedule. Patient said " I am feeling much better and I will just wait until my aex next month". AEX scheduled 03/04/17 with Leota Sauerseborah Leonard, CNM.

## 2017-02-14 ENCOUNTER — Ambulatory Visit (INDEPENDENT_AMBULATORY_CARE_PROVIDER_SITE_OTHER): Payer: 59 | Admitting: Family Medicine

## 2017-02-14 ENCOUNTER — Encounter: Payer: Self-pay | Admitting: Family Medicine

## 2017-02-14 VITALS — BP 122/78 | HR 66 | Temp 98.1°F | Resp 16 | Ht 62.0 in | Wt 136.1 lb

## 2017-02-14 DIAGNOSIS — E785 Hyperlipidemia, unspecified: Secondary | ICD-10-CM | POA: Diagnosis not present

## 2017-02-14 DIAGNOSIS — Z0000 Encounter for general adult medical examination without abnormal findings: Secondary | ICD-10-CM | POA: Diagnosis not present

## 2017-02-14 DIAGNOSIS — Z23 Encounter for immunization: Secondary | ICD-10-CM

## 2017-02-14 DIAGNOSIS — E559 Vitamin D deficiency, unspecified: Secondary | ICD-10-CM

## 2017-02-14 NOTE — Assessment & Plan Note (Signed)
Chronic problem.  Tolerating statin w/o difficulty.  Check labs.  Adjust meds prn  

## 2017-02-14 NOTE — Patient Instructions (Signed)
Follow up in 6 months to recheck cholesterol We'll notify you of your lab results and make any changes if needed Continue to work on healthy diet and regular exercise- you look great! Call and schedule your mammogram! Call with any questions or concerns Happy Fall!!!

## 2017-02-14 NOTE — Assessment & Plan Note (Signed)
Hx of this.  Check labs.  Replete prn. 

## 2017-02-14 NOTE — Addendum Note (Signed)
Addended by: Lenis DickinsonILLARD, Byard Carranza M on: 02/14/2017 09:18 AM   Modules accepted: Orders

## 2017-02-14 NOTE — Assessment & Plan Note (Signed)
Pt's PE WNL.  UTD on colonoscopy, pap, Tdap.  Due for mammo- pt to schedule.  Check labs.  Anticipatory guidance provided.

## 2017-02-14 NOTE — Progress Notes (Signed)
Pre visit review using our clinic review tool, if applicable. No additional management support is needed unless otherwise documented below in the visit note. 

## 2017-02-14 NOTE — Progress Notes (Signed)
   Subjective:    Patient ID: Sonia Kim, female    DOB: 12/27/1955, 61 y.o.   MRN: [id number]  HPI CPE- UTD on pap [name](Patty Grubb), colonoscopy.  Needs to schedule mammo.  Is going to get flu shot today   Review of Systems Patient reports no vision/ hearing changes, adenopathy,fever, weight change,  persistant/recurrent hoarseness , swallowing issues, chest pain, palpitations, edema, persistant/recurrent cough, hemoptysis, dyspnea (rest/exertional/paroxysmal nocturnal), gastrointestinal bleeding (melena, rectal bleeding), abdominal pain, significant heartburn, bowel changes, GU symptoms (dysuria, hematuria, incontinence), Gyn symptoms (abnormal  bleeding, pain),  syncope, focal weakness, memory loss, numbness & tingling, skin/hair/nail changes, abnormal bruising or bleeding, anxiety, or depression.     Objective:   Physical Exam General Appearance:    Alert, cooperative, no distress, appears stated age  Head:    Normocephalic, without obvious abnormality, atraumatic  Eyes:    PERRL, conjunctiva/corneas clear, EOM's intact, fundi    benign, both eyes  Ears:    Normal TM's and external ear canals, both ears  Nose:   Nares normal, septum midline, mucosa normal, no drainage    or sinus tenderness  Throat:   Lips, mucosa, and tongue normal; teeth and gums normal  Neck:   Supple, symmetrical, trachea midline, no adenopathy;    Thyroid: no enlargement/tenderness/nodules  Back:     Symmetric, no curvature, ROM normal, no CVA tenderness  Lungs:     Clear to auscultation bilaterally, respirations unlabored  Chest Wall:    No tenderness or deformity   Heart:    Regular rate and rhythm, S1 and S2 normal, no murmur, rub   or gallop  Breast Exam:    Deferred to GYN  Abdomen:     Soft, non-tender, bowel sounds active all four quadrants,    no masses, no organomegaly  Genitalia:    Deferred to GYN  Rectal:    Extremities:   Extremities normal, atraumatic, no cyanosis or edema  Pulses:   2+ and  symmetric all extremities  Skin:   Skin color, texture, turgor normal, no rashes or lesions  Lymph nodes:   Cervical, supraclavicular, and axillary nodes normal  Neurologic:   CNII-XII intact, normal strength, sensation and reflexes    throughout          Assessment & Plan:

## 2017-02-14 NOTE — Addendum Note (Signed)
Addended by: Lenis DickinsonILLARD, Janesha Brissette M on: 02/14/2017 09:17 AM   Modules accepted: Orders

## 2017-02-17 NOTE — Telephone Encounter (Signed)
yes

## 2017-02-17 NOTE — Telephone Encounter (Signed)
Okay to close encounter?  °

## 2017-02-18 LAB — TSH

## 2017-02-18 LAB — BASIC METABOLIC PANEL

## 2017-02-18 LAB — CBC WITH DIFFERENTIAL/PLATELET

## 2017-02-18 LAB — HEPATIC FUNCTION PANEL

## 2017-02-18 LAB — VITAMIN D 25 HYDROXY (VIT D DEFICIENCY, FRACTURES)

## 2017-02-18 LAB — LIPID PANEL

## 2017-02-21 ENCOUNTER — Encounter: Payer: Self-pay | Admitting: Family Medicine

## 2017-02-24 ENCOUNTER — Encounter: Payer: Self-pay | Admitting: Family Medicine

## 2017-02-25 ENCOUNTER — Encounter: Payer: Self-pay | Admitting: Family Medicine

## 2017-02-25 ENCOUNTER — Ambulatory Visit (INDEPENDENT_AMBULATORY_CARE_PROVIDER_SITE_OTHER): Payer: 59 | Admitting: Family Medicine

## 2017-02-25 VITALS — BP 122/80 | HR 68 | Temp 99.1°F | Resp 16 | Ht 62.0 in | Wt 134.5 lb

## 2017-02-25 DIAGNOSIS — E785 Hyperlipidemia, unspecified: Secondary | ICD-10-CM | POA: Diagnosis not present

## 2017-02-25 DIAGNOSIS — E559 Vitamin D deficiency, unspecified: Secondary | ICD-10-CM

## 2017-02-25 DIAGNOSIS — R1011 Right upper quadrant pain: Secondary | ICD-10-CM | POA: Diagnosis not present

## 2017-02-25 DIAGNOSIS — Z0000 Encounter for general adult medical examination without abnormal findings: Secondary | ICD-10-CM | POA: Diagnosis not present

## 2017-02-25 DIAGNOSIS — R82998 Other abnormal findings in urine: Secondary | ICD-10-CM

## 2017-02-25 DIAGNOSIS — R8299 Other abnormal findings in urine: Secondary | ICD-10-CM | POA: Diagnosis not present

## 2017-02-25 LAB — BASIC METABOLIC PANEL
BUN: 15 mg/dL (ref 6–23)
CO2: 31 mEq/L (ref 19–32)
Calcium: 9.7 mg/dL (ref 8.4–10.5)
Chloride: 105 mEq/L (ref 96–112)
Creatinine, Ser: 0.69 mg/dL (ref 0.40–1.20)
GFR: 92.05 mL/min (ref >60.00)
Glucose, Bld: 94 mg/dL (ref 70–99)
Potassium: 4.2 mEq/L (ref 3.5–5.1)
Sodium: 142 mEq/L (ref 135–145)

## 2017-02-25 LAB — POCT URINALYSIS DIPSTICK
Bilirubin, UA: NEGATIVE
Blood, UA: NEGATIVE
Glucose, UA: NEGATIVE
Ketones, UA: NEGATIVE
Nitrite, UA: NEGATIVE
Protein, UA: NEGATIVE
Spec Grav, UA: 1.01 (ref 1.010–1.025)
Urobilinogen, UA: 0.2 E.U./dL
pH, UA: 5 (ref 5.0–8.0)

## 2017-02-25 LAB — CBC WITH DIFFERENTIAL/PLATELET
Basophils Absolute: 0.1 10*3/uL (ref 0.0–0.1)
Basophils Relative: 1 % (ref 0.0–3.0)
Eosinophils Absolute: 0.2 10*3/uL (ref 0.0–0.7)
Eosinophils Relative: 3.6 % (ref 0.0–5.0)
HCT: 42.9 % (ref 36.0–46.0)
Hemoglobin: 14.5 g/dL (ref 12.0–15.0)
Lymphocytes Relative: 27.1 % (ref 12.0–46.0)
Lymphs Abs: 1.7 10*3/uL (ref 0.7–4.0)
MCHC: 33.9 g/dL (ref 30.0–36.0)
MCV: 94.4 fl (ref 78.0–100.0)
Monocytes Absolute: 0.4 10*3/uL (ref 0.1–1.0)
Monocytes Relative: 7.1 % (ref 3.0–12.0)
Neutro Abs: 3.8 10*3/uL (ref 1.4–7.7)
Neutrophils Relative %: 61.2 % (ref 43.0–77.0)
Platelets: 215 10*3/uL (ref 150.0–400.0)
RBC: 4.54 Mil/uL (ref 3.87–5.11)
RDW: 12.5 % (ref 11.5–15.5)
WBC: 6.3 10*3/uL (ref 4.0–10.5)

## 2017-02-25 LAB — AMYLASE: Amylase: 46 U/L (ref 27–131)

## 2017-02-25 LAB — LIPID PANEL
Cholesterol: 178 mg/dL (ref 0–200)
HDL: 56.5 mg/dL (ref >39.00)
LDL Cholesterol: 97 mg/dL (ref 0–99)
NonHDL: 121.23
Total CHOL/HDL Ratio: 3
Triglycerides: 119 mg/dL (ref 0.0–149.0)
VLDL: 23.8 mg/dL (ref 0.0–40.0)

## 2017-02-25 LAB — HEPATIC FUNCTION PANEL
ALT: 9 U/L (ref 0–35)
AST: 17 U/L (ref 0–37)
Albumin: 4.5 g/dL (ref 3.5–5.2)
Alkaline Phosphatase: 53 U/L (ref 39–117)
Bilirubin, Direct: 0.1 mg/dL (ref 0.0–0.3)
Total Bilirubin: 0.6 mg/dL (ref 0.2–1.2)
Total Protein: 6.6 g/dL (ref 6.0–8.3)

## 2017-02-25 LAB — TSH: TSH: 2.7 u[IU]/mL (ref 0.35–4.50)

## 2017-02-25 LAB — VITAMIN D 25 HYDROXY (VIT D DEFICIENCY, FRACTURES): VITD: 40.14 ng/mL (ref 30.00–100.00)

## 2017-02-25 LAB — LIPASE: Lipase: 45 U/L (ref 11.0–59.0)

## 2017-02-25 NOTE — Addendum Note (Signed)
Addended by: Con Memos on: 02/25/2017 11:22 AM   Modules accepted: Orders

## 2017-02-25 NOTE — Progress Notes (Signed)
   Subjective:    Patient ID: Sonia Kim, female    DOB: 17-Oct-1955, 61 y.o.   MRN: [id number]  HPI RUQ pain- pt reports 'i always think it's a pulled muscle' but lately isn't so sure.  Will radiate to back.  + nausea w/ eating certain food.  Yesterday developed pain after eating yogurt.  Heating pad will improve sxs.  sxs have been intermittent for 'a couple of months'.  No known family hx of gallbladder issues.  No radiation of pain to shoulder.   Review of Systems For ROS see HPI     Objective:   Physical Exam  Constitutional: She is oriented to person, place, and time. She appears well-developed and well-nourished. No distress.  HENT:  Head: Normocephalic and atraumatic.  Eyes: Pupils are equal, round, and reactive to light. Conjunctivae and EOM are normal.  Neck: Normal range of motion. Neck supple. No thyromegaly present.  Cardiovascular: Normal rate, regular rhythm, normal heart sounds and intact distal pulses.   No murmur heard. Pulmonary/Chest: Effort normal and breath sounds normal. No respiratory distress.  Abdominal: Soft. She exhibits no distension and no mass. There is tenderness (mild RUQ TTP). There is no rebound and no guarding.  Musculoskeletal: She exhibits no edema.  Lymphadenopathy:    She has no cervical adenopathy.  Neurological: She is alert and oriented to person, place, and time.  Skin: Skin is warm and dry. No rash noted.  Psychiatric: She has a normal mood and affect. Her behavior is normal.  Vitals reviewed.         Assessment & Plan:  RUQ pain- new.  Reviewed that the timing of her pain and the episodic nature is not consistent w/ a muscle strain.  It is more consistent w/ possible biliary colic.  Pt eats very well and does not consume a lot of fat but when she does eat rich foods, she notices increased pain and nausea.  Check labs.  Get [place] to assess.  Reviewed supportive care and red flags that should prompt return.  Pt expressed understanding  and is in agreement w/ plan.

## 2017-02-25 NOTE — Progress Notes (Signed)
Pre visit review using our clinic review tool, if applicable. No additional management support is needed unless otherwise documented below in the visit note. 

## 2017-02-25 NOTE — Patient Instructions (Signed)
Follow up as needed or as scheduled We'll notify you of your lab results and make any changes if needed Try and avoid fatty or rich foods as this may worsen your pain and nausea We'll call you with your ultrasound appt to assess the gallbladder Call with any questions or concerns Hang in there!!!

## 2017-02-26 LAB — URINE CULTURE
MICRO NUMBER:: 81030675
Result:: NO GROWTH
SPECIMEN QUALITY:: ADEQUATE

## 2017-02-27 ENCOUNTER — Ambulatory Visit: Payer: 59

## 2017-02-27 NOTE — Progress Notes (Signed)
Called pt and lmovm to return call.

## 2017-03-04 ENCOUNTER — Ambulatory Visit (INDEPENDENT_AMBULATORY_CARE_PROVIDER_SITE_OTHER): Payer: 59 | Admitting: Certified Nurse Midwife

## 2017-03-04 ENCOUNTER — Encounter: Payer: Self-pay | Admitting: Certified Nurse Midwife

## 2017-03-04 VITALS — BP 102/60 | HR 68 | Resp 16 | Ht 62.25 in | Wt 135.0 lb

## 2017-03-04 DIAGNOSIS — Z01419 Encounter for gynecological examination (general) (routine) without abnormal findings: Secondary | ICD-10-CM | POA: Diagnosis not present

## 2017-03-04 DIAGNOSIS — Z79890 Hormone replacement therapy (postmenopausal): Secondary | ICD-10-CM | POA: Diagnosis not present

## 2017-03-04 DIAGNOSIS — N951 Menopausal and female climacteric states: Secondary | ICD-10-CM | POA: Diagnosis not present

## 2017-03-04 DIAGNOSIS — Z8639 Personal history of other endocrine, nutritional and metabolic disease: Secondary | ICD-10-CM | POA: Diagnosis not present

## 2017-03-04 MED ORDER — MEDROXYPROGESTERONE ACETATE 5 MG PO TABS: ORAL_TABLET | ORAL | 0 refills | Status: DC

## 2017-03-04 MED ORDER — ESTRADIOL 0.5 MG PO TABS: ORAL_TABLET | ORAL | 0 refills | Status: DC

## 2017-03-04 NOTE — Progress Notes (Signed)
61 y.o. G33P0003 Married  Caucasian Fe here for annual exam.  Menopausal on HRT. Denies vaginal dryness or vaginal bleeding. Continues with hot flashes and some night sweats. Taking Zocor for cholesterol and also has Hypothyroid, and uses Zoloft for anxiety/Depression with PCP management. She also has all labs with PCP too. Patient tried to wean down on HRT, but hot flashes continued.Worried about cholesterol and working on [employer] maintenance with exercise. Sees Dermatology for skin check yearly. No health issues today. May try gluten free diet.  Patient's last menstrual period was 03/10/2010.          Sexually active: No.  The current method of family planning is tubal ligation.    Exercising: Yes.    walking & treadmill Smoker:  no  Health Maintenance: Pap:  01-16-15 neg HPV HR neg History of Abnormal Pap: no MMG:  01-23-16 category b density birads 1:neg Self Breast exams: yes Colonoscopy:  2015 f/u 34yrs BMD:   none TDaP:  2013 Shingles: no Pneumonia: no Hep C and HIV: pt declined, believes it was done before Labs: no   reports that she quit smoking about 12 years ago. She has a 12.50 pack-year smoking history. She has never used smokeless tobacco. She reports that she drinks about 3.0 oz of alcohol per week . She reports that she does not use drugs.  Past Medical History:  Diagnosis Date  . Allergy    Dogs, dust, and mold  . Anxiety   . Benign essential HTN 08/31/2015  . Chronic neck pain   . Colon polyps   . Depression   . Heart murmur   . Heart murmur   . Hypercholesteremia   . Hyperlipidemia 05/2005  . Hypothyroid 06/2009  . Osteoarthritis of thoracic spine   . Shingles 06/2011    Past Surgical History:  Procedure Laterality Date  . COLONOSCOPY  2009  . TUBAL LIGATION  1984 & 05/2005   reversal in 1995  . tubal reversal      Current Outpatient Prescriptions  Medication Sig Dispense Refill  . ALPRAZolam (XANAX) 0.5 MG tablet Take 0.5 mg by mouth 3 (three) times  daily as needed for anxiety.     . Biotin 1 MG CAPS Take by mouth.    . calcium carbonate (TUMS - DOSED IN MG ELEMENTAL CALCIUM) 500 MG chewable tablet Chew 1 tablet by mouth 3 (three) times daily as needed for indigestion or heartburn.    . cholecalciferol (VITAMIN D) 1000 units tablet Take 1,000 Units by mouth daily.     . Cyanocobalamin (VITAMIN B-12 PO) Take 1,000 mg by mouth daily.     Marland Kitchen estradiol (ESTRACE) 0.5 MG tablet Take 1 tablet by mouth  daily 90 tablet 3  . fluticasone (FLONASE) 50 MCG/ACT nasal spray Place 2 sprays into both nostrils daily. 48 g 1  . levothyroxine (SYNTHROID, LEVOTHROID) 50 MCG tablet Take 1 tablet (50 mcg total) by mouth daily before breakfast. 90 tablet 1  . medroxyPROGESTERone (PROVERA) 5 MG tablet Take 1 tablet by mouth  daily 90 tablet 3  . meloxicam (MOBIC) 15 MG tablet Take 1 tablet (15 mg total) by mouth daily. (Patient taking differently: Take 15 mg by mouth as needed. ) 30 tablet 1  . Omega 3-6-9 Fatty Acids (OMEGA-3-6-9 PO) Take by mouth.    . rosuvastatin (CRESTOR) 5 MG tablet Take 1 tablet (5 mg total) by mouth daily. (Patient taking differently: Take 5 mg by mouth daily. chewable) 90 tablet 1  . sertraline (  ZOLOFT) 25 MG tablet TAKE 1 TABLET BY MOUTH  DAILY 90 tablet 1  . sodium chloride (MURO 128) 5 % ophthalmic ointment Apply to eye.    . TURMERIC PO Take by mouth.     No current facility-administered medications for this visit.     Family History  Problem Relation Age of Onset  . Thyroid disease Mother   . Lung cancer Mother   . CVA Father   . Diabetes Father   . Hyperlipidemia Father   . CVA Paternal Grandfather   . CVA Paternal Grandmother     ROS:  Pertinent items are noted in HPI.  Otherwise, a comprehensive ROS was negative.  Exam:   BP 102/60   Pulse 68   Resp 16   Ht 5' 2.25" (1.581 m)   Wt 135 lb (61.2 kg)   LMP 03/10/2010   BMI 24.49 kg/m  Height: 5' 2.25" (158.1 cm) Ht Readings from Last 3 Encounters:  03/04/17 5'  2.25" (1.581 m)  02/25/17  (1.575 m)  02/14/17  (1.575 m)    General appearance: alert, cooperative and appears stated age Head: Normocephalic, without obvious abnormality, atraumatic Neck: no adenopathy, supple, symmetrical, trachea midline and thyroid normal to inspection and palpation Lungs: clear to auscultation bilaterally Breasts: normal appearance, no masses or tenderness, No nipple retraction or dimpling, No nipple discharge or bleeding, No axillary or supraclavicular adenopathy Heart: regular rate and rhythm Abdomen: soft, non-tender; no masses,  no organomegaly Extremities: extremities normal, atraumatic, no cyanosis or edema Skin: Skin color, texture, turgor normal. No rashes or lesions Lymph nodes: Cervical, supraclavicular, and axillary nodes normal. No abnormal inguinal nodes palpated Neurologic: Grossly normal   Pelvic: External genitalia:  no lesions              Urethra:  normal appearing urethra with no masses, tenderness or lesions              Bartholin's and Skene's: normal                 Vagina: normal appearing vagina with normal color and discharge, no lesions              Cervix: no cervical motion tenderness, no lesions and normal appearance              Pap taken: No. Bimanual Exam:  Uterus:  normal size, contour, position, consistency, mobility, non-tender              Adnexa: normal adnexa and no mass, fullness, tenderness               Rectovaginal: Confirms               Anus:  normal sphincter tone, no lesions  Chaperone present: yes  A:  Well Woman with normal exam  Menopausal on HRT desires to wean off   BMD due  Cholesterol/hypothyroid/anxiety/depression with PCP management  P:   Reviewed health and wellness pertinent to exam  Discussed risks and benefits of HRT and she is taking Zocor, which can cause flushing also, so hot flashes may not be totally coming from HRT. Patient plans to start on 1/2 tablet of Estrace and Provera daily  and work on weaning down over the next few months. Will advise when she stops.  Rx Estrace see order with instructions  Rx Provera see order with instructions  Discussed BMD and patient will call and schedule  Continue MD follow up as indicated  Pap smear: no   counseled on breast self exam, mammography screening, feminine hygiene, use and side effects of HRT, adequate intake of calcium and vitamin D, diet and exercise  return annually or prn  An After Visit Summary was printed and given to the patient.

## 2017-03-04 NOTE — Patient Instructions (Signed)

## 2017-03-13 ENCOUNTER — Ambulatory Visit: Payer: 59

## 2017-03-18 ENCOUNTER — Ambulatory Visit: Payer: Self-pay | Admitting: Physician Assistant

## 2017-03-20 ENCOUNTER — Ambulatory Visit
Admission: RE | Admit: 2017-03-20 | Discharge: 2017-03-20 | Disposition: A | Discharge status: Home / Self Care | Payer: 59 | Source: Ambulatory Visit | Attending: Certified Nurse Midwife | Admitting: Certified Nurse Midwife

## 2017-03-20 ENCOUNTER — Ambulatory Visit
Admission: RE | Admit: 2017-03-20 | Discharge: 2017-03-20 | Disposition: A | Discharge status: Home / Self Care | Payer: 59 | Source: Ambulatory Visit | Attending: Nurse Practitioner | Admitting: Nurse Practitioner

## 2017-03-20 DIAGNOSIS — E2839 Other primary ovarian failure: Secondary | ICD-10-CM

## 2017-03-20 DIAGNOSIS — Z13820 Encounter for screening for osteoporosis: Secondary | ICD-10-CM | POA: Diagnosis not present

## 2017-03-20 DIAGNOSIS — Z780 Asymptomatic menopausal state: Secondary | ICD-10-CM | POA: Diagnosis not present

## 2017-03-20 DIAGNOSIS — Z1239 Encounter for other screening for malignant neoplasm of breast: Secondary | ICD-10-CM

## 2017-03-20 DIAGNOSIS — Z1231 Encounter for screening mammogram for malignant neoplasm of breast: Secondary | ICD-10-CM | POA: Diagnosis not present

## 2017-04-10 ENCOUNTER — Encounter: Payer: Self-pay | Admitting: Family Medicine

## 2017-04-10 ENCOUNTER — Ambulatory Visit (INDEPENDENT_AMBULATORY_CARE_PROVIDER_SITE_OTHER): Payer: 59 | Admitting: Family Medicine

## 2017-04-10 DIAGNOSIS — J309 Allergic rhinitis, unspecified: Secondary | ICD-10-CM | POA: Insufficient documentation

## 2017-04-10 DIAGNOSIS — J3089 Other allergic rhinitis: Secondary | ICD-10-CM | POA: Diagnosis not present

## 2017-04-10 MED ORDER — MECLIZINE HCL 25 MG PO TABS: 25.0000 mg | ORAL_TABLET | Freq: Three times a day (TID) | ORAL | 0 refills | Status: DC | PRN

## 2017-04-10 MED ORDER — FLUTICASONE PROPIONATE 50 MCG/ACT NA SUSP: 2.0000 | Freq: Every day | NASAL | 1 refills | Status: DC

## 2017-04-10 MED ORDER — MONTELUKAST SODIUM 10 MG PO TABS: 10.0000 mg | ORAL_TABLET | Freq: Every day | ORAL | 3 refills | Status: DC

## 2017-04-10 NOTE — Assessment & Plan Note (Signed)
Deteriorated.  Pt's sxs are severe in both spring and fall.  Will add Singulair nightly in addition to daily antihistamine and nasal spray.  Reviewed supportive care and red flags that should prompt return.  Pt expressed understanding and is in agreement w/ plan.

## 2017-04-10 NOTE — Progress Notes (Signed)
   Subjective:    Patient ID: Sonia Kim, female    DOB: 1956/03/13, 61 y.o.   MRN: [id number]  HPI Pt notes some lightheadedness and dizziness since 'the weather change'.  Hx of seasonal allergies.  + hoarseness, nasal congestion, swollen tonsils.  No fevers.  No ear pain but bilateral fullness.  Taking Allegra daily.  No known sick contacts.   Review of Systems For ROS see HPI     Objective:   Physical Exam  Constitutional: She is oriented to person, place, and time. She appears well-developed and well-nourished. No distress.  HENT:  Head: Normocephalic and atraumatic.  Right Ear: Tympanic membrane normal.  Left Ear: Tympanic membrane normal.  Nose: Mucosal edema and rhinorrhea present. Right sinus exhibits no maxillary sinus tenderness and no frontal sinus tenderness. Left sinus exhibits no maxillary sinus tenderness and no frontal sinus tenderness.  Mouth/Throat: Mucous membranes are normal. Posterior oropharyngeal erythema (w/ PND) present.  Eyes: Pupils are equal, round, and reactive to light. Conjunctivae and EOM are normal.  Neck: Normal range of motion. Neck supple.  Cardiovascular: Normal rate, regular rhythm and normal heart sounds.   Pulmonary/Chest: Effort normal and breath sounds normal. No respiratory distress. She has no wheezes. She has no rales.  Lymphadenopathy:    She has no cervical adenopathy.  Neurological: She is alert and oriented to person, place, and time.  Skin: Skin is warm and dry.  Psychiatric: She has a normal mood and affect. Her behavior is normal. Thought content normal.  Vitals reviewed.         Assessment & Plan:

## 2017-04-10 NOTE — Patient Instructions (Signed)
Follow up as needed or as scheduled Continue the daily Allegra and Flonase daily Add the Singulair once nightly Add OTC decongestant for 2-3 days (phenylephrine or Sudafed) Drink plenty of fluids Call with any questions or concerns Hang in there!!!

## 2017-04-17 ENCOUNTER — Other Ambulatory Visit: Payer: Self-pay | Admitting: Family Medicine

## 2017-04-24 ENCOUNTER — Encounter: Payer: Self-pay | Admitting: Family Medicine

## 2017-04-25 ENCOUNTER — Ambulatory Visit (INDEPENDENT_AMBULATORY_CARE_PROVIDER_SITE_OTHER): Payer: 59 | Admitting: Family Medicine

## 2017-04-25 ENCOUNTER — Other Ambulatory Visit: Payer: Self-pay

## 2017-04-25 ENCOUNTER — Encounter: Payer: Self-pay | Admitting: Family Medicine

## 2017-04-25 DIAGNOSIS — B029 Zoster without complications: Secondary | ICD-10-CM | POA: Insufficient documentation

## 2017-04-25 MED ORDER — VALACYCLOVIR HCL 1 G PO TABS: 1000.0000 mg | ORAL_TABLET | Freq: Three times a day (TID) | ORAL | 0 refills | Status: DC

## 2017-04-25 NOTE — Progress Notes (Signed)
   Subjective:    Patient ID: Sonia Kim, female    DOB: 05-18-1956, 61 y.o.   MRN: [id number]  HPI Shingles- rash first appeared 3 days ago.  Pt reports she had 'tingling' for 'about 3 weeks' prior to rash appearing.  Has hx of shingles.  Pt has a 'patch' that itches.   Review of Systems For ROS see HPI     Objective:   Physical Exam  Constitutional: She appears well-developed and well-nourished. No distress.  HENT:  Head: Normocephalic and atraumatic.  Skin: Skin is warm and dry. Rash (linear distribution of vesicular rash on L anterior thigh) noted.  Vitals reviewed.         Assessment & Plan:

## 2017-04-25 NOTE — Patient Instructions (Signed)
Follow up as needed or as scheduled Start the Valtrex as directed- 3x/day x7 days with food Keep area covered when around children or pregnant women Call with any questions or concerns Happy Thanksgiving!!!

## 2017-04-25 NOTE — Assessment & Plan Note (Signed)
Pt's burning that preceded rash outbreak is consistent w/ shingles. Start Valtrex.  Reviewed dx and tx.  Pt expressed understanding and is in agreement w/ plan.

## 2017-04-29 ENCOUNTER — Other Ambulatory Visit: Payer: Self-pay | Admitting: Family Medicine

## 2017-05-05 ENCOUNTER — Telehealth: Payer: Self-pay | Admitting: Certified Nurse Midwife

## 2017-05-05 NOTE — Telephone Encounter (Signed)
Patient states she saw Debbie back in August and she wanted her to stop the hormones.  Patient did stop and states it is "unbearable".  Would like to know some other options she can try to help with her symptoms.

## 2017-05-05 NOTE — Telephone Encounter (Signed)
Spoke with patient. Reports she weaned off of estradiol and provera 2.5 months ago as discussed at last AEX, hot flashes have become unbearable.   Reports 6-7 hot flashes per day, soaking wet, requesting alternative or recommendations.   Advised patient Leota SauersDeborah Leonard, CNM is out of the office today, will review once she returns on 11/27 and return call with recommendations. Patient is agreeable.  Leota Sauerseborah Leonard, CNM -please review and advise?

## 2017-05-06 NOTE — Telephone Encounter (Signed)
What dosage was she on when she weaned off. If she has been off 2 months this should be decreasing.. Does she notice increase after using her Zocor which can also increase flushing. If so will need to discuss with PCP. She can use Estroven OTC per instructions, if she has no contraindications to use, be sure an read label.

## 2017-05-06 NOTE — Telephone Encounter (Signed)
Ok to restart but will need to try to wean off again after 3- 6 months. Please review warning signs and risks and benefits again with patient.

## 2017-05-06 NOTE — Telephone Encounter (Signed)
Spoke with patient. Patient was taking 0.25 mg estradiol daily and 2.5mg  provera daily when she weaned. Reports she has been taking Crestor for 5 years.   Patient states she has used OTC estroven years ago and declines to try again. Patient requesting to restart estradiol and provera. Patient states she is having trouble sleeping and the hot flashes are embarrassing.   Advised patient would update Leota Sauerseborah Leonard, CNM and return call with recommendations, patient is agreeable.   Leota Sauerseborah Leonard, CNM -please advise?

## 2017-05-07 MED ORDER — MEDROXYPROGESTERONE ACETATE 2.5 MG PO TABS: 2.5000 mg | ORAL_TABLET | Freq: Every day | ORAL | 1 refills | Status: DC

## 2017-05-07 MED ORDER — ESTRADIOL 0.5 MG PO TABS: 0.2500 mg | ORAL_TABLET | Freq: Every day | ORAL | 1 refills | Status: DC

## 2017-05-07 NOTE — Telephone Encounter (Addendum)
Spoke with patient, advised as seen below per Leota Sauerseborah Leonard, CNM. Patient request Rx to go to [facility]. Reviewed risk of estrogen : DVT, PE, MI, Stroke, breast cancer. Patient verbalizes understanding and is agreeable.   Rx to [facility] -Provera 2.5 mg daily #90/1RF and estradiol 0.5mg  tab -take 0.25mg  daily #45/1RF  Routing to provider for final review. Patient is agreeable to disposition. Will close encounter.

## 2017-06-11 ENCOUNTER — Encounter: Payer: Self-pay | Admitting: Family Medicine

## 2017-06-11 ENCOUNTER — Ambulatory Visit (INDEPENDENT_AMBULATORY_CARE_PROVIDER_SITE_OTHER)
Admission: RE | Admit: 2017-06-11 | Discharge: 2017-06-11 | Disposition: A | Discharge status: Home / Self Care | Payer: 59 | Source: Ambulatory Visit | Attending: Family Medicine | Admitting: Family Medicine

## 2017-06-11 ENCOUNTER — Ambulatory Visit (INDEPENDENT_AMBULATORY_CARE_PROVIDER_SITE_OTHER): Payer: 59 | Admitting: Family Medicine

## 2017-06-11 VITALS — BP 160/90 | HR 70 | Ht 62.0 in | Wt 135.0 lb

## 2017-06-11 DIAGNOSIS — M25562 Pain in left knee: Secondary | ICD-10-CM

## 2017-06-11 DIAGNOSIS — M25552 Pain in left hip: Secondary | ICD-10-CM | POA: Diagnosis not present

## 2017-06-11 DIAGNOSIS — M25551 Pain in right hip: Secondary | ICD-10-CM | POA: Diagnosis not present

## 2017-06-11 DIAGNOSIS — G8929 Other chronic pain: Secondary | ICD-10-CM

## 2017-06-11 DIAGNOSIS — M25561 Pain in right knee: Secondary | ICD-10-CM | POA: Diagnosis not present

## 2017-06-11 DIAGNOSIS — M2550 Pain in unspecified joint: Secondary | ICD-10-CM

## 2017-06-11 MED ORDER — VITAMIN D (ERGOCALCIFEROL) 1.25 MG (50000 UNIT) PO CAPS: 50000.0000 [IU] | ORAL_CAPSULE | ORAL | 0 refills | Status: DC

## 2017-06-11 MED ORDER — VENLAFAXINE HCL ER 37.5 MG PO CP24: 37.5000 mg | ORAL_CAPSULE | Freq: Every day | ORAL | 1 refills | Status: DC

## 2017-06-11 NOTE — Progress Notes (Signed)
Tawana ScaleZach Kimala Horne D.O. [facility] [address] [place], [place] [address] Phone: [phone] Subjective:    I'm seeing this patient by the request  of:  Sheliah Hatchabori, Katherine E, MD   CC: Bilateral knee and hip pain  ONG:[id number]:Subjective  Curlene LabrumDenise D Klich is a 62 y.o. female coming in with complaint of bilateral knee and hip pain. Hip pain radiates to the groin. Left knee swells right knee is painful. Last week she could barely step out of bed due to knee pain.   Onset- Chronic  Location- Hip to groin, medial knee (left) behind the knee cap (right) Character- Achy  Aggravating factors- crossing legs Reliving factors- Ice, heat, motron Severity- 6/10 and worsening   Patient has had x-rays of her back from April 09, 2016.  These were independently visualized by me.  Patient did have some osteoarthritic changes at L4-L5 with mild degenerative disc and foraminal narrowing.  Past Medical History:  Diagnosis Date  . Allergy    Dogs, dust, and mold  . Anxiety   . Benign essential HTN 08/31/2015  . Chronic neck pain   . Colon polyps   . Depression   . Heart murmur   . Heart murmur   . Hypercholesteremia   . Hyperlipidemia 05/2005  . Hypothyroid 06/2009  . Osteoarthritis of thoracic spine   . Shingles 06/2011   Past Surgical History:  Procedure Laterality Date  . COLONOSCOPY  2009  . TUBAL LIGATION  1984 & 05/2005   reversal in 1995  . tubal reversal     Social History   Socioeconomic History  . Marital status: Married    Spouse name: None  . Number of children: 3  . Years of education: None  . Highest education level: None  Social Needs  . Financial resource strain: None  . Food insecurity - worry: None  . Food insecurity - inability: None  . Transportation needs - medical: None  . Transportation needs - non-medical: None  Occupational History  . None  Tobacco Use  . Smoking status: Former Smoker    Packs/day: 0.50    Years: 25.00    Pack years: 12.50    Last  attempt to quit: 02/25/2005    Years since quitting: 12.2  . Smokeless tobacco: Never Used  Substance and Sexual Activity  . Alcohol use: Yes    Alcohol/week: 3.0 oz    Types: 5 Glasses of wine per week  . Drug use: No  . Sexual activity: Not Currently    Partners: Male    Birth control/protection: Surgical    Comment: BTL  Other Topics Concern  . None  Social History Narrative  . None   Allergies  Allergen Reactions  . Lisinopril Cough  . Oxycodone-Acetaminophen Nausea Only   Family History  Problem Relation Age of Onset  . Thyroid disease Mother   . Lung cancer Mother   . CVA Father   . Diabetes Father   . Hyperlipidemia Father   . Hashimoto's thyroiditis Sister   . CVA Paternal Grandfather   . CVA Paternal Grandmother      Past medical history, social, surgical and family history all reviewed in electronic medical record.  No pertanent information unless stated regarding to the chief complaint.   Review of Systems:Review of systems updated and as accurate as of 06/11/17  No headache, visual changes, nausea, vomiting, diarrhea, constipation, dizziness, abdominal pain, skin rash, fevers, chills, night sweats, weight loss, swollen lymph nodes, , joint swelling,  chest pain, shortness of breath, mood changes.   Objective  Blood pressure (!) 160/90, pulse 70, height 5\' 2"  (1.575 m), weight 135 lb (61.2 kg), last menstrual period 03/10/2010, SpO2 97 %. Systems examined below as of 06/11/17   General: No apparent distress alert and oriented x3 mood and affect normal, dressed appropriately.  HEENT: Pupils equal, extraocular movements intact  Respiratory: Patient's speak in full sentences and does not appear short of breath  Cardiovascular: No lower extremity edema, non tender, no erythema  Skin: Warm dry intact with no signs of infection or rash on extremities or on axial skeleton.  Abdomen: Soft nontender  Neuro: Cranial nerves II through XII are intact, neurovascularly  intact in all extremities with 2+ DTRs and 2+ pulses.  Lymph: No lymphadenopathy of posterior or anterior cervical chain or axillae bilaterally.  Gait normal with good balance and coordination.  MSK:  Mild to moderate tender with full range of motion and good stability and symmetric strength and tone of shoulders, elbows, wrist, and ankles bilaterally.   Knee exam shows some significant crepitus.. Right hip exam shows the patient does have decreasing internal range of motion significantly.  Patient does have a positive Pearlean Brownie as well.  Tightness.    Impression and Recommendations:     This case required medical decision making of moderate complexity.      Note: This dictation was prepared with Dragon dictation along with smaller phrase technology. Any transcriptional errors that result from this process are unintentional.

## 2017-06-11 NOTE — Patient Instructions (Addendum)
Good to see you  Xrays downstairs Continue the turmeric and the tart cherry  Start once weekly vitamin D for 12 weeks Effexor again at 37.5 mg daily and stop the zoloft Lets calm it all down and see me again in 3 weeks and we will discuss increasing effexor or injections.

## 2017-06-11 NOTE — Assessment & Plan Note (Signed)
Worsening symptoms of patient's polyarthralgia.  Discussed with patient in great length.  I believe the patient neither has fibromyalgia or patient has had difficulty with thyroid previously as well.  We discussed the possibility of another laboratory workup with knowing that sometimes autoimmune diseases are seronegative for up to 7 years.  We discussed we will get x-rays to make sure that there is no erosive changes and further evaluate the right hip decreased range of motion.  We discussed with patient about over-the-counter medications.  Patient also started on Effexor that I think will be beneficial with patient only using her Zoloft intermittently.  Warned of potential side effects.  We discussed icing regimen.  Come back again in 3 weeks.

## 2017-06-13 ENCOUNTER — Telehealth: Payer: Self-pay | Admitting: Family Medicine

## 2017-06-13 NOTE — Telephone Encounter (Signed)
Please advise 

## 2017-06-13 NOTE — Telephone Encounter (Signed)
Copied from CRM [phone]. Topic: Inquiry >> Jun 13, 2017 10:03 AM Sonia Kim, Sonia Kim I, NT wrote: Reason for CRM: Pt call for her X ray Result 06/11/17 please call pt with result Thank,You

## 2017-06-16 NOTE — Telephone Encounter (Signed)
Left message for patient to call back. I tried to call her twice on Friday but there was a poor connection with her phone and mine. Just called her again and got her voicemail. Asked for patient to call back to pass along hip xray results.

## 2017-06-16 NOTE — Telephone Encounter (Signed)
Pt called in to check the status of her hip imaging results. Pt is requesting a call back as soon as possible.   Please advise.

## 2017-07-02 ENCOUNTER — Encounter: Payer: Self-pay | Admitting: Family Medicine

## 2017-07-02 ENCOUNTER — Ambulatory Visit (INDEPENDENT_AMBULATORY_CARE_PROVIDER_SITE_OTHER): Payer: 59 | Admitting: Family Medicine

## 2017-07-02 ENCOUNTER — Other Ambulatory Visit (INDEPENDENT_AMBULATORY_CARE_PROVIDER_SITE_OTHER): Payer: 59

## 2017-07-02 VITALS — BP 164/80 | HR 72 | Ht 62.0 in | Wt 136.0 lb

## 2017-07-02 DIAGNOSIS — M549 Dorsalgia, unspecified: Secondary | ICD-10-CM

## 2017-07-02 DIAGNOSIS — M2550 Pain in unspecified joint: Secondary | ICD-10-CM | POA: Diagnosis not present

## 2017-07-02 LAB — IBC PANEL
Iron: 93 ug/dL (ref 42–145)
Saturation Ratios: 21.4 % (ref 20.0–50.0)
Transferrin: 310 mg/dL (ref 212.0–360.0)

## 2017-07-02 LAB — CK: Total CK: 63 U/L (ref 7–177)

## 2017-07-02 LAB — C-REACTIVE PROTEIN: CRP: 0.1 mg/dL — ABNORMAL LOW (ref 0.5–20.0)

## 2017-07-02 LAB — URIC ACID: Uric Acid, Serum: 4.4 mg/dL (ref 2.4–7.0)

## 2017-07-02 LAB — SEDIMENTATION RATE: Sed Rate: 4 mm/hr (ref 0–30)

## 2017-07-02 NOTE — Progress Notes (Signed)
[facility] [address] [place], [place] [address] Phone: [phone] Subjective:     CC: Back pain follow-up  QIH:[id number]  Sonia Kim is a 62 y.o. female coming in with complaint of back pain.  Was found to have more of a piriformis syndrome.  Responded fairly well to osteopathic manipulation and was started on Effexor 1 year ago patient did do better with this medication but wanted to titrate off of it..  Laboratory workup for polymyalgia was unremarkable.  Patient at last exam was also having bilateral patella femoral syndrome decreasing range of motion of the hip as well restarted patient on the Effexor.  Patient did not take it secondary to nausea.  Having worsening pain.  Patient did have imaging including bilateral standing knee x-rays were independently visualized by me showing no significant bony abnormality patient did have a right hip x-ray as well.  Also unremarkable.  X-rays of the lumbar spine from 2017 was also independently visualized by me showing slight osteoarthritic changes at L4-L5  Past Medical History:  Diagnosis Date  . Allergy    Dogs, dust, and mold  . Anxiety   . Benign essential HTN 08/31/2015  . Chronic neck pain   . Colon polyps   . Depression   . Heart murmur   . Heart murmur   . Hypercholesteremia   . Hyperlipidemia 05/2005  . Hypothyroid 06/2009  . Osteoarthritis of thoracic spine   . Shingles 06/2011   Past Surgical History:  Procedure Laterality Date  . COLONOSCOPY  2009  . TUBAL LIGATION  1984 & 05/2005   reversal in 1995  . tubal reversal     Social History   Socioeconomic History  . Marital status: Married    Spouse name: None  . Number of children: 3  . Years of education: None  . Highest education level: None  Social Needs  . Financial resource strain: None  . Food insecurity - worry: None  . Food insecurity - inability: None  . Transportation needs - medical: None  . Transportation needs  - non-medical: None  Occupational History  . None  Tobacco Use  . Smoking status: Former Smoker    Packs/day: 0.50    Years: 25.00    Pack years: 12.50    Last attempt to quit: 02/25/2005    Years since quitting: 12.3  . Smokeless tobacco: Never Used  Substance and Sexual Activity  . Alcohol use: Yes    Alcohol/week: 3.0 oz    Types: 5 Glasses of wine per week  . Drug use: No  . Sexual activity: Not Currently    Partners: Male    Birth control/protection: Surgical    Comment: BTL  Other Topics Concern  . None  Social History Narrative  . None   Allergies  Allergen Reactions  . Lisinopril Cough  . Oxycodone-Acetaminophen Nausea Only   Family History  Problem Relation Age of Onset  . Thyroid disease Mother   . Lung cancer Mother   . CVA Father   . Diabetes Father   . Hyperlipidemia Father   . Hashimoto's thyroiditis Sister   . CVA Paternal Grandfather   . CVA Paternal Grandmother      Past medical history, social, surgical and family history all reviewed in electronic medical record.  No pertanent information unless stated regarding to the chief complaint.   Review of Systems:Review of systems updated and as accurate as of 07/02/17  No headache, visual changes,  nausea, vomiting, diarrhea, constipation, dizziness, abdominal pain, skin rash, fevers, chills, night sweats, weight loss, swollen lymph nodes,, chest pain, shortness of breath, mood changes.  Positive muscle aches, body aches joint swelling  Objective  Blood pressure (!) 164/80, pulse 72, height 5\' 2"  (1.575 m), weight 136 lb (61.7 kg), last menstrual period 03/10/2010, SpO2 98 %. Systems examined below as of 07/02/17   General: No apparent distress alert and oriented x3 mood and affect normal, dressed appropriately.  HEENT: Pupils equal, extraocular movements intact  Respiratory: Patient's speak in full sentences and does not appear short of breath  Cardiovascular: No lower extremity edema, non tender, no  erythema  Skin: Warm dry intact with no signs of infection or rash on extremities or on axial skeleton.  Abdomen: Soft nontender  Neuro: Cranial nerves II through XII are intact, neurovascularly intact in all extremities with 2+ DTRs and 2+ pulses.  Lymph: No lymphadenopathy of posterior or anterior cervical chain or axillae bilaterally.  Gait normal with good balance and coordination.  MSK:  tender with full range of motion and good stability and symmetric strength and tone of shoulders, elbows, wrist, hip, knee and ankles bilaterally.  Patient does have some mild swelling of the MCP joints bilaterally.  This is new from previous exam.      Impression and Recommendations:     This case required medical decision making of moderate complexity.      Note: This dictation was prepared with Dragon dictation along with smaller phrase technology. Any transcriptional errors that result from this process are unintentional.

## 2017-07-02 NOTE — Assessment & Plan Note (Signed)
Worsening symptoms overall.  Has had difficulty with thyroid before and we had worked her up over a year ago for a polyarthralgia.  Patient is having worsening symptoms and now has swelling of the MCP.  Still concern for possible autoimmune and further laboratory workup ordered today.  We discussed different medications which patient declined at this time including prednisone as well as the possibility of Cymbalta.  Patient is just wondering why she has so much pain.  We discussed that this could be more of a chronic pain versus a possible fibromyalgia type symptoms.  May need to consider rheumatology referral as well.  We will see what laboratory workup finds and then go from there.

## 2017-07-02 NOTE — Patient Instructions (Signed)
I am sorry I do not have a great answer Lets get labs again and see if anything has changed over the last year.  Keep up with everything else Depending on labs we will discuss possible prednisone, Referral to rheumatology or medication changes.

## 2017-07-03 LAB — ANTI-NUCLEAR AB-TITER (ANA TITER): ANA Titer 1: 1:40 {titer} — ABNORMAL HIGH

## 2017-07-03 LAB — CALCIUM, IONIZED: Calcium, Ion: 5.3 mg/dL (ref 4.8–5.6)

## 2017-07-03 LAB — PTH, INTACT AND CALCIUM
Calcium: 9.8 mg/dL (ref 8.6–10.4)
PTH: 45 pg/mL (ref 14–64)

## 2017-07-03 LAB — RHEUMATOID FACTOR: Rhuematoid fact SerPl-aCnc: <14 IU/mL (ref <14)

## 2017-07-03 LAB — ANA: Anti Nuclear Antibody(ANA): POSITIVE — AB

## 2017-07-03 LAB — CYCLIC CITRUL PEPTIDE ANTIBODY, IGG: Cyclic Citrullin Peptide Ab: <16 UNITS

## 2017-07-03 LAB — ANGIOTENSIN CONVERTING ENZYME: Angiotensin-Converting Enzyme: 56 U/L (ref 9–67)

## 2017-07-05 ENCOUNTER — Encounter: Payer: Self-pay | Admitting: Family Medicine

## 2017-07-07 ENCOUNTER — Other Ambulatory Visit: Payer: Self-pay

## 2017-07-07 ENCOUNTER — Encounter: Payer: Self-pay | Admitting: Family Medicine

## 2017-07-07 DIAGNOSIS — R768 Other specified abnormal immunological findings in serum: Secondary | ICD-10-CM

## 2017-07-07 DIAGNOSIS — R7689 Other specified abnormal immunological findings in serum: Secondary | ICD-10-CM

## 2017-07-07 MED ORDER — PREDNISONE 50 MG PO TABS: 50.0000 mg | ORAL_TABLET | Freq: Every day | ORAL | 0 refills | Status: DC

## 2017-07-21 ENCOUNTER — Other Ambulatory Visit: Payer: Self-pay | Admitting: Family Medicine

## 2017-07-23 ENCOUNTER — Encounter: Payer: Self-pay | Admitting: Family Medicine

## 2017-07-28 ENCOUNTER — Encounter: Payer: Self-pay | Admitting: Family Medicine

## 2017-07-28 ENCOUNTER — Ambulatory Visit (INDEPENDENT_AMBULATORY_CARE_PROVIDER_SITE_OTHER): Payer: 59 | Admitting: Family Medicine

## 2017-07-28 ENCOUNTER — Other Ambulatory Visit: Payer: Self-pay

## 2017-07-28 VITALS — BP 130/80 | HR 72 | Temp 98.0°F | Resp 16 | Ht 62.0 in | Wt 138.0 lb

## 2017-07-28 DIAGNOSIS — M25551 Pain in right hip: Secondary | ICD-10-CM | POA: Diagnosis not present

## 2017-07-28 MED ORDER — IBUPROFEN 600 MG PO TABS: 600.0000 mg | ORAL_TABLET | Freq: Three times a day (TID) | ORAL | 0 refills | Status: DC | PRN

## 2017-07-28 NOTE — Progress Notes (Signed)
   Subjective:    Patient ID: Sonia Kim, female    DOB: 10-Feb-1956, 62 y.o.   MRN: [id number]  HPI R hip pain- pt reports it is getting 'progressively worse'.  Pt says she was supposed to have an MRI 'months ago' but never went b/c of health issues w/ her husband.  The pain is causing depression.  Has not exercised x6 months, 'I just can't do anything'.  Pt reports that course of prednisone improved her hip pain.  Pain starts in R buttock, radiates around laterally and into groin and also sends shooting pains down the leg.  At times, unable to lift R leg due to pain.  + weakness of R leg.  No relief w/ Meloxicam.  Taking 6 Motrin daily w/ some relief.   Review of Systems For ROS see HPI     Objective:   Physical Exam  Constitutional: She is oriented to person, place, and time. She appears well-developed and well-nourished. No distress.  Musculoskeletal: She exhibits tenderness (TTP over R buttock). She exhibits no edema.  Pain w/ R hip abduction.  No pain w/ adduction.  Pain w/ R hip flexion but not extension  Neurological: She is alert and oriented to person, place, and time. She has normal reflexes. No cranial nerve deficit. Coordination normal.  Skin: Skin is warm and dry. No rash noted. No erythema.  Psychiatric: She has a normal mood and affect. Her behavior is normal. Thought content normal.  Vitals reviewed.         Assessment & Plan:  R hip pain- pt's groin pain and subjective leg weakness is concerning for true hip pain.  No relief w/ mobic but feels better w/ Motrin.  Will start prescription strength.  Suspect pt will need MRI to evaluate.  Refer to Ortho for complete evaluation and tx.  Reviewed supportive care and red flags that should prompt return.  Pt expressed understanding and is in agreement w/ plan.

## 2017-07-28 NOTE — Patient Instructions (Signed)
Follow up as needed or as scheduled We'll call you with your Ortho appt Start the Ibuprofen 3x/day with food Continue to monitor BP and let me know if it's consistently higher than 140/90 Call with any questions or concerns Hang in there!!!

## 2017-08-01 DIAGNOSIS — M25551 Pain in right hip: Secondary | ICD-10-CM | POA: Diagnosis not present

## 2017-08-01 DIAGNOSIS — M5416 Radiculopathy, lumbar region: Secondary | ICD-10-CM | POA: Diagnosis not present

## 2017-08-01 DIAGNOSIS — M545 Low back pain: Secondary | ICD-10-CM | POA: Diagnosis not present

## 2017-08-11 DIAGNOSIS — M25551 Pain in right hip: Secondary | ICD-10-CM | POA: Diagnosis not present

## 2017-08-11 DIAGNOSIS — M545 Low back pain: Secondary | ICD-10-CM | POA: Diagnosis not present

## 2017-08-14 DIAGNOSIS — M25551 Pain in right hip: Secondary | ICD-10-CM | POA: Diagnosis not present

## 2017-08-15 ENCOUNTER — Ambulatory Visit: Payer: 59 | Admitting: Family Medicine

## 2017-08-18 DIAGNOSIS — M25551 Pain in right hip: Secondary | ICD-10-CM | POA: Diagnosis not present

## 2017-09-09 DIAGNOSIS — M25551 Pain in right hip: Secondary | ICD-10-CM | POA: Diagnosis not present

## 2017-09-11 ENCOUNTER — Ambulatory Visit (INDEPENDENT_AMBULATORY_CARE_PROVIDER_SITE_OTHER): Payer: 59 | Admitting: Family Medicine

## 2017-09-11 ENCOUNTER — Other Ambulatory Visit: Payer: Self-pay | Admitting: Family Medicine

## 2017-09-11 ENCOUNTER — Encounter: Payer: Self-pay | Admitting: Family Medicine

## 2017-09-11 ENCOUNTER — Other Ambulatory Visit: Payer: Self-pay

## 2017-09-11 VITALS — BP 126/82 | HR 81 | Temp 98.1°F | Resp 16 | Ht 62.0 in | Wt 135.2 lb

## 2017-09-11 DIAGNOSIS — D72829 Elevated white blood cell count, unspecified: Secondary | ICD-10-CM

## 2017-09-11 DIAGNOSIS — E039 Hypothyroidism, unspecified: Secondary | ICD-10-CM | POA: Diagnosis not present

## 2017-09-11 DIAGNOSIS — E785 Hyperlipidemia, unspecified: Secondary | ICD-10-CM

## 2017-09-11 LAB — LIPID PANEL
Cholesterol: 193 mg/dL (ref 0–200)
HDL: 61.1 mg/dL (ref >39.00)
LDL Cholesterol: 106 mg/dL — ABNORMAL HIGH (ref 0–99)
NonHDL: 131.87
Total CHOL/HDL Ratio: 3
Triglycerides: 127 mg/dL (ref 0.0–149.0)
VLDL: 25.4 mg/dL (ref 0.0–40.0)

## 2017-09-11 LAB — CBC WITH DIFFERENTIAL/PLATELET
Basophils Absolute: 0.1 10*3/uL (ref 0.0–0.1)
Basophils Relative: 0.5 % (ref 0.0–3.0)
Eosinophils Absolute: 0 10*3/uL (ref 0.0–0.7)
Eosinophils Relative: 0.1 % (ref 0.0–5.0)
HCT: 43.5 % (ref 36.0–46.0)
Hemoglobin: 14.2 g/dL (ref 12.0–15.0)
Lymphocytes Relative: 10.4 % — ABNORMAL LOW (ref 12.0–46.0)
Lymphs Abs: 1.7 10*3/uL (ref 0.7–4.0)
MCHC: 32.6 g/dL (ref 30.0–36.0)
MCV: 94.4 fl (ref 78.0–100.0)
Monocytes Absolute: 0.9 10*3/uL (ref 0.1–1.0)
Monocytes Relative: 5.5 % (ref 3.0–12.0)
Neutro Abs: 13.5 10*3/uL — ABNORMAL HIGH (ref 1.4–7.7)
Neutrophils Relative %: 83.5 % — ABNORMAL HIGH (ref 43.0–77.0)
Platelets: 262 10*3/uL (ref 150.0–400.0)
RBC: 4.61 Mil/uL (ref 3.87–5.11)
RDW: 13.2 % (ref 11.5–15.5)
WBC: 16.2 10*3/uL — ABNORMAL HIGH (ref 4.0–10.5)

## 2017-09-11 LAB — HEPATIC FUNCTION PANEL
ALT: 18 U/L (ref 0–35)
AST: 18 U/L (ref 0–37)
Albumin: 4.3 g/dL (ref 3.5–5.2)
Alkaline Phosphatase: 60 U/L (ref 39–117)
Bilirubin, Direct: 0.1 mg/dL (ref 0.0–0.3)
Total Bilirubin: 0.5 mg/dL (ref 0.2–1.2)
Total Protein: 6.6 g/dL (ref 6.0–8.3)

## 2017-09-11 LAB — BASIC METABOLIC PANEL
BUN: 19 mg/dL (ref 6–23)
CO2: 28 mEq/L (ref 19–32)
Calcium: 9.5 mg/dL (ref 8.4–10.5)
Chloride: 104 mEq/L (ref 96–112)
Creatinine, Ser: 0.69 mg/dL (ref 0.40–1.20)
GFR: 91.89 mL/min (ref >60.00)
Glucose, Bld: 105 mg/dL — ABNORMAL HIGH (ref 70–99)
Potassium: 4 mEq/L (ref 3.5–5.1)
Sodium: 139 mEq/L (ref 135–145)

## 2017-09-11 LAB — TSH: TSH: 1.32 u[IU]/mL (ref 0.35–4.50)

## 2017-09-11 NOTE — Assessment & Plan Note (Signed)
Chronic problem.  Pt is struggling w/ hot flashes since weaning off her hormones.  Will check TSH to ensure thyroid is not contributing.  Will follow.

## 2017-09-11 NOTE — Progress Notes (Signed)
   Subjective:    Patient ID: Curlene Labrumenise D Bacote, female    DOB: 09-24-1955, 62 y.o.   MRN: [id number]  HPI Hyperlipidemia- chronic problem, on Crestor 5mg  daily.  Down 3 lbs since last visit.  Pt plans to resume exercising since hip is feeling better.  No CP, SOB, HAs, visual changes, abd pain, N/V  Hypothyroid- chronic problem, on 50mcg daily.  Pt has worsening hot flashes since stopping her hormones.  Pt also finds it difficult to lose weight.  Some thinning of hair, no changes to skin or nails.   Review of Systems For ROS see HPI     Objective:   Physical Exam  Constitutional: She is oriented to person, place, and time. She appears well-developed and well-nourished. No distress.  HENT:  Head: Normocephalic and atraumatic.  Eyes: Pupils are equal, round, and reactive to light. Conjunctivae and EOM are normal.  Neck: Normal range of motion. Neck supple. No thyromegaly present.  Cardiovascular: Normal rate, regular rhythm, normal heart sounds and intact distal pulses.  No murmur heard. Pulmonary/Chest: Effort normal and breath sounds normal. No respiratory distress.  Abdominal: Soft. She exhibits no distension. There is no tenderness.  Musculoskeletal: She exhibits no edema.  Lymphadenopathy:    She has no cervical adenopathy.  Neurological: She is alert and oriented to person, place, and time.  Skin: Skin is warm and dry.  Psychiatric: She has a normal mood and affect. Her behavior is normal.  Vitals reviewed.         Assessment & Plan:

## 2017-09-11 NOTE — Assessment & Plan Note (Signed)
Chronic problem.  Tolerating Crestor w/o difficulty.  Applauded her efforts w/ diet and exercise.  Check labs.  Adjust meds prn

## 2017-09-11 NOTE — Patient Instructions (Signed)
Schedule your complete physical in 6 months We'll notify you of your lab results and make any changes if needed Continue to work on healthy diet and regular exercise- you look great!!! Call with any questions or concerns Happy Spring!! 

## 2017-10-05 ENCOUNTER — Other Ambulatory Visit: Payer: Self-pay | Admitting: Family Medicine

## 2017-10-10 ENCOUNTER — Other Ambulatory Visit: Payer: Self-pay | Admitting: Certified Nurse Midwife

## 2017-10-13 NOTE — Telephone Encounter (Signed)
Medication refill request: Estradiol  Last AEX:  03/04/17 DL  Next AEX: 16/1/09  Last MMG (if hormonal medication request): 03/20/17 BIRADS 1 negative  Refill authorized: 05/07/17 #45, 1 RF.   Medication refill request: Provera Refill authorized: 05/07/17 #90, 1RF.   Call to patient. See telephone encounter dated 05/05/17, patient states that she has been off HRT since AEX in 02/2017, did not restart medication after it was sent in on 05/07/17. Patient states that 2 weeks ago, hot flashes started again and they are "debilitating." States she can have 15 a day and so she restarted HRT, states she is taking "whatever dose she last called in." Needs refill. Pharmacy confirmed. Advised Leota Sauers, CNM is out of the office today, but would review upon return.

## 2017-10-15 NOTE — Telephone Encounter (Signed)
Ok to restart, but recommend revisiting this in 3 months and see what the symptoms are

## 2017-10-16 ENCOUNTER — Other Ambulatory Visit: Payer: Self-pay | Admitting: Family Medicine

## 2017-10-16 NOTE — Telephone Encounter (Signed)
Message left to return call to Janita Camberos at [phone].    

## 2017-10-21 ENCOUNTER — Telehealth: Payer: Self-pay | Admitting: *Deleted

## 2017-10-21 NOTE — Telephone Encounter (Signed)
Call to patient. Message given to patient as seen below from Leota Sauers, CNM and patient verbalized understanding. Patient states she will cal late July/early August to give update. Patient aware to call for appointment before then if symptoms worsen. Patient agreeable.   Patient agreeable to disposition. Will close encounter.

## 2017-10-21 NOTE — Telephone Encounter (Signed)
After being off it may take up to 2-3 months have all symptoms resolved and she adjusts if symptoms are from Menopause.

## 2017-10-21 NOTE — Telephone Encounter (Signed)
She can call, but if any changes will need OV

## 2017-10-21 NOTE — Telephone Encounter (Signed)
Spoke with patient. See telephone encounter from 10/21/17.

## 2017-10-21 NOTE — Telephone Encounter (Signed)
See refill encounter dated 10/10/17. Call to patient. Spoke with patient in regards to refill of estradiol and provera. Per Leota Sauers, CNM, okay to restart HRT, but patient needs 3 month follow up. RN advised patient of this and patient states she has been on HRT for over a month now and is still having severe hot flashes. RN advised patient could schedule to be seen sooner if current dosage of HRT not working for symptom relief. Patient declines stating, "my insurance is not going to cover that. They only cover my once a year check up." Patient states she would still be responsible for co-pay. Patient states Debbi had halved her dosage of estradiol and she's not sure if that is the issue. RN advised would review with Leota Sauers, CNM and return call with additional recommendations. Patient agreeable.   Routing to provider for review.

## 2017-10-21 NOTE — Telephone Encounter (Signed)
Does patient need to come back for follow up in 3 months or is she okay to call and give update after 3 months?

## 2017-12-05 ENCOUNTER — Ambulatory Visit (INDEPENDENT_AMBULATORY_CARE_PROVIDER_SITE_OTHER): Payer: 59 | Admitting: Family Medicine

## 2017-12-05 ENCOUNTER — Encounter: Payer: Self-pay | Admitting: Family Medicine

## 2017-12-05 ENCOUNTER — Other Ambulatory Visit: Payer: Self-pay

## 2017-12-05 VITALS — BP 121/81 | HR 72 | Temp 98.1°F | Resp 16 | Ht 62.0 in | Wt 136.2 lb

## 2017-12-05 DIAGNOSIS — R768 Other specified abnormal immunological findings in serum: Secondary | ICD-10-CM

## 2017-12-05 DIAGNOSIS — M2550 Pain in unspecified joint: Secondary | ICD-10-CM

## 2017-12-05 MED ORDER — MELOXICAM 15 MG PO TABS: 15.0000 mg | ORAL_TABLET | Freq: Every day | ORAL | 1 refills | Status: DC

## 2017-12-05 NOTE — Assessment & Plan Note (Signed)
New to provider, ongoing for pt.  She has seen Sports Medicine previously and they had discussed a Rheumatology referral.  She was referred as 'fibromyalgia' and Dr Shawnee KnappBeekman's office refused to see pt.  She has a + ANA, severe AM sxs, and physical manifestations of early RA.  Based on this, will refer back to Rheumatology for complete evaluation and start once daily Mobic.  Pt expressed understanding and is in agreement w/ plan.

## 2017-12-05 NOTE — Progress Notes (Signed)
   Subjective:    Patient ID: Sonia Labrumenise D Kim, female    DOB: 16-Jan-1956, 62 y.o.   MRN: [id number]  HPI Joint pain- pt reports sxs are progressively worsening and 'quickly' particularly in hands bilaterally.  Pt is having enlarged, swollen knuckles as well as some nodularity.  sxs are constant.  Pain is worse in the AM- improves w/ increased activity.  Constant back pain.  Pt does have hx of + ANA.  + family hx of RA.   Review of Systems For ROS see HPI     Objective:   Physical Exam  Constitutional: She is oriented to person, place, and time. She appears well-developed and well-nourished. No distress.  Cardiovascular: Intact distal pulses.  Musculoskeletal: She exhibits tenderness (TTP over hands and wrists bilaterally) and deformity (pt has enlargement of MCP joints bilaterally w/ nodularity of PIP/DIP joints and slight ulnar deviation).  Neurological: She is alert and oriented to person, place, and time.  Skin: Skin is warm and dry.  Psychiatric: She has a normal mood and affect. Her behavior is normal. Thought content normal.  Vitals reviewed.         Assessment & Plan:

## 2017-12-05 NOTE — Patient Instructions (Addendum)
Follow up as scheduled or as needed We'll call you with your Rheumatology appt START the Meloxicam once daily- take w/ food You can add Tylenol (acetaminophen) as needed for breakthrough pain Call with any questions or concerns Hang in there!!!

## 2018-01-05 DIAGNOSIS — M2550 Pain in unspecified joint: Secondary | ICD-10-CM | POA: Diagnosis not present

## 2018-01-05 DIAGNOSIS — M65312 Trigger thumb, left thumb: Secondary | ICD-10-CM | POA: Diagnosis not present

## 2018-01-05 DIAGNOSIS — M150 Primary generalized (osteo)arthritis: Secondary | ICD-10-CM | POA: Diagnosis not present

## 2018-01-11 ENCOUNTER — Other Ambulatory Visit: Payer: Self-pay | Admitting: Family Medicine

## 2018-02-02 DIAGNOSIS — M1811 Unilateral primary osteoarthritis of first carpometacarpal joint, right hand: Secondary | ICD-10-CM | POA: Diagnosis not present

## 2018-02-02 DIAGNOSIS — M65312 Trigger thumb, left thumb: Secondary | ICD-10-CM | POA: Diagnosis not present

## 2018-02-04 ENCOUNTER — Other Ambulatory Visit: Payer: Self-pay | Admitting: Certified Nurse Midwife

## 2018-02-04 DIAGNOSIS — Z1231 Encounter for screening mammogram for malignant neoplasm of breast: Secondary | ICD-10-CM

## 2018-02-23 ENCOUNTER — Other Ambulatory Visit: Payer: Self-pay | Admitting: Family Medicine

## 2018-02-27 ENCOUNTER — Other Ambulatory Visit: Payer: Self-pay | Admitting: Family Medicine

## 2018-02-27 DIAGNOSIS — Z23 Encounter for immunization: Secondary | ICD-10-CM | POA: Diagnosis not present

## 2018-03-03 DIAGNOSIS — M545 Low back pain: Secondary | ICD-10-CM | POA: Diagnosis not present

## 2018-03-03 DIAGNOSIS — M542 Cervicalgia: Secondary | ICD-10-CM | POA: Diagnosis not present

## 2018-03-04 ENCOUNTER — Other Ambulatory Visit: Payer: Self-pay | Admitting: Certified Nurse Midwife

## 2018-03-05 NOTE — Telephone Encounter (Signed)
Medication refill request: estrace 0.5mg   Tablet & provera 2.5 mg Last AEX:  03-04-17 Next AEX: 03-11-18 Last MMG (if hormonal medication request): 10/18 neg Refill authorized: pt has aex 03-11-18. Pt was restarted on hormones after being off . Pt to follow up at appt. Please approve estrace tablet if appropriate. The provera shows it was given #90 with 1 refill on 10-15-17. So that rx can be denied.

## 2018-03-11 ENCOUNTER — Ambulatory Visit: Payer: 59 | Admitting: Certified Nurse Midwife

## 2018-03-12 ENCOUNTER — Telehealth: Payer: Self-pay | Admitting: Family Medicine

## 2018-03-12 NOTE — Telephone Encounter (Signed)
Please advise 

## 2018-03-12 NOTE — Telephone Encounter (Signed)
Called pt to reschedule appt, she was upset that I couldn't get her in before the end of the year. I scheduled her for 08/12/18, should I fit her in on Tabori's  schedule?    Copied from [id number]. Topic: Appointment Scheduling - Scheduling Inquiry for Clinic >> Mar 12, 2018  9:25 AM Leafy Ro wrote: Reason for CRM:pt had to cancel her 03-24-18 physical appt. Pt would like physical before end of year with dr Beverely Low

## 2018-03-17 NOTE — Telephone Encounter (Signed)
Since pt is the one that has to cancel her appt, she can be placed on a cancellation list and we can call her if there is an opening.

## 2018-03-19 ENCOUNTER — Encounter: Payer: Self-pay | Admitting: Family Medicine

## 2018-03-19 DIAGNOSIS — M5032 Other cervical disc degeneration, mid-cervical region, unspecified level: Secondary | ICD-10-CM | POA: Diagnosis not present

## 2018-03-19 DIAGNOSIS — M47812 Spondylosis without myelopathy or radiculopathy, cervical region: Secondary | ICD-10-CM | POA: Diagnosis not present

## 2018-03-19 DIAGNOSIS — M9901 Segmental and somatic dysfunction of cervical region: Secondary | ICD-10-CM | POA: Diagnosis not present

## 2018-03-20 ENCOUNTER — Other Ambulatory Visit: Payer: Self-pay | Admitting: Certified Nurse Midwife

## 2018-03-23 ENCOUNTER — Other Ambulatory Visit: Payer: Self-pay | Admitting: Certified Nurse Midwife

## 2018-03-23 ENCOUNTER — Ambulatory Visit
Admission: RE | Admit: 2018-03-23 | Discharge: 2018-03-23 | Disposition: A | Discharge status: Home / Self Care | Payer: 59 | Source: Ambulatory Visit | Attending: Certified Nurse Midwife | Admitting: Certified Nurse Midwife

## 2018-03-23 DIAGNOSIS — Z1231 Encounter for screening mammogram for malignant neoplasm of breast: Secondary | ICD-10-CM

## 2018-03-23 NOTE — Telephone Encounter (Signed)
Medication refill request: provera Last AEX:  03/04/2017 Next AEX: 04/08/2018 Last MMG (if hormonal medication request): 03/20/2017 BI-RADS CATEGORY  1: Negative Refill authorized: #90, 1 refill

## 2018-03-24 ENCOUNTER — Encounter: Payer: 59 | Admitting: Family Medicine

## 2018-03-24 DIAGNOSIS — M9901 Segmental and somatic dysfunction of cervical region: Secondary | ICD-10-CM | POA: Diagnosis not present

## 2018-03-24 DIAGNOSIS — M5032 Other cervical disc degeneration, mid-cervical region, unspecified level: Secondary | ICD-10-CM | POA: Diagnosis not present

## 2018-03-24 DIAGNOSIS — M47812 Spondylosis without myelopathy or radiculopathy, cervical region: Secondary | ICD-10-CM | POA: Diagnosis not present

## 2018-03-25 ENCOUNTER — Ambulatory Visit (INDEPENDENT_AMBULATORY_CARE_PROVIDER_SITE_OTHER): Payer: 59 | Admitting: Family Medicine

## 2018-03-25 ENCOUNTER — Other Ambulatory Visit: Payer: Self-pay

## 2018-03-25 ENCOUNTER — Encounter: Payer: Self-pay | Admitting: Family Medicine

## 2018-03-25 VITALS — BP 118/80 | HR 66 | Temp 98.1°F | Resp 17 | Ht 62.0 in | Wt 134.5 lb

## 2018-03-25 DIAGNOSIS — Z0000 Encounter for general adult medical examination without abnormal findings: Secondary | ICD-10-CM | POA: Diagnosis not present

## 2018-03-25 DIAGNOSIS — E559 Vitamin D deficiency, unspecified: Secondary | ICD-10-CM | POA: Diagnosis not present

## 2018-03-25 DIAGNOSIS — E785 Hyperlipidemia, unspecified: Secondary | ICD-10-CM | POA: Diagnosis not present

## 2018-03-25 LAB — LIPID PANEL
Cholesterol: 179 mg/dL (ref 0–200)
HDL: 49 mg/dL (ref >39.00)
LDL Cholesterol: 103 mg/dL — ABNORMAL HIGH (ref 0–99)
NonHDL: 130.15
Total CHOL/HDL Ratio: 4
Triglycerides: 137 mg/dL (ref 0.0–149.0)
VLDL: 27.4 mg/dL (ref 0.0–40.0)

## 2018-03-25 LAB — CBC WITH DIFFERENTIAL/PLATELET
Basophils Absolute: 0.1 10*3/uL (ref 0.0–0.1)
Basophils Relative: 1.3 % (ref 0.0–3.0)
Eosinophils Absolute: 0.4 10*3/uL (ref 0.0–0.7)
Eosinophils Relative: 5.5 % — ABNORMAL HIGH (ref 0.0–5.0)
HCT: 43.4 % (ref 36.0–46.0)
Hemoglobin: 14.4 g/dL (ref 12.0–15.0)
Lymphocytes Relative: 23.6 % (ref 12.0–46.0)
Lymphs Abs: 1.5 10*3/uL (ref 0.7–4.0)
MCHC: 33.3 g/dL (ref 30.0–36.0)
MCV: 93.7 fl (ref 78.0–100.0)
Monocytes Absolute: 0.5 10*3/uL (ref 0.1–1.0)
Monocytes Relative: 7.7 % (ref 3.0–12.0)
Neutro Abs: 4 10*3/uL (ref 1.4–7.7)
Neutrophils Relative %: 61.9 % (ref 43.0–77.0)
Platelets: 228 10*3/uL (ref 150.0–400.0)
RBC: 4.63 Mil/uL (ref 3.87–5.11)
RDW: 13.2 % (ref 11.5–15.5)
WBC: 6.5 10*3/uL (ref 4.0–10.5)

## 2018-03-25 LAB — BASIC METABOLIC PANEL
BUN: 15 mg/dL (ref 6–23)
CO2: 30 mEq/L (ref 19–32)
Calcium: 9.6 mg/dL (ref 8.4–10.5)
Chloride: 105 mEq/L (ref 96–112)
Creatinine, Ser: 0.78 mg/dL (ref 0.40–1.20)
GFR: 79.62 mL/min (ref >60.00)
Glucose, Bld: 90 mg/dL (ref 70–99)
Potassium: 4 mEq/L (ref 3.5–5.1)
Sodium: 142 mEq/L (ref 135–145)

## 2018-03-25 LAB — VITAMIN D 25 HYDROXY (VIT D DEFICIENCY, FRACTURES): VITD: 35.18 ng/mL (ref 30.00–100.00)

## 2018-03-25 LAB — HEPATIC FUNCTION PANEL
ALT: 10 U/L (ref 0–35)
AST: 17 U/L (ref 0–37)
Albumin: 4.6 g/dL (ref 3.5–5.2)
Alkaline Phosphatase: 68 U/L (ref 39–117)
Bilirubin, Direct: 0.1 mg/dL (ref 0.0–0.3)
Total Bilirubin: 0.7 mg/dL (ref 0.2–1.2)
Total Protein: 6.7 g/dL (ref 6.0–8.3)

## 2018-03-25 LAB — TSH: TSH: 2.36 u[IU]/mL (ref 0.35–4.50)

## 2018-03-25 MED ORDER — MEDROXYPROGESTERONE ACETATE 2.5 MG PO TABS: 2.5000 mg | ORAL_TABLET | Freq: Every day | ORAL | 0 refills | Status: DC

## 2018-03-25 MED ORDER — ALBUTEROL SULFATE HFA 108 (90 BASE) MCG/ACT IN AERS: 2.0000 | INHALATION_SPRAY | Freq: Four times a day (QID) | RESPIRATORY_TRACT | 2 refills | Status: DC | PRN

## 2018-03-25 NOTE — Telephone Encounter (Signed)
Needs current mammogram for refill 

## 2018-03-25 NOTE — Assessment & Plan Note (Signed)
Pt's PE WNL.  UTD on colonoscopy, mammo, immunizations.  Pap scheduled.  Check labs.  Anticipatory guidance provided.

## 2018-03-25 NOTE — Telephone Encounter (Signed)
Medication refill request: Provera 2.5 mg tab  Last AEX:  03/04/17 Next AEX: 04/08/18 Last MMG (if hormonal medication request): 03/23/18 Bi rads 1 neg  Refill authorized:  #90 with 0 RF

## 2018-03-25 NOTE — Patient Instructions (Signed)
Follow up in 6 months to recheck thyroid and cholesterol We'll notify you of your lab results and make any changes if needed Continue to work on healthy diet and regular exercise- you look great! Call with any questions or concerns Happy Fall!!!

## 2018-03-25 NOTE — Progress Notes (Signed)
   Subjective:    Patient ID: Sonia Kim, female    DOB: 08-12-1955, 62 y.o.   MRN: [id number]  HPI CPE- UTD on mammo, colonoscopy, immunizations.  Pap scheduled for next week.   Review of Systems Patient reports no vision/ hearing changes, adenopathy,fever, weight change,  persistant/recurrent hoarseness , swallowing issues, chest pain, palpitations, edema, persistant/recurrent cough, hemoptysis, dyspnea (rest/exertional/paroxysmal nocturnal), gastrointestinal bleeding (melena, rectal bleeding), abdominal pain, significant heartburn, bowel changes, GU symptoms (dysuria, hematuria, incontinence), Gyn symptoms (abnormal  bleeding, pain),  syncope, focal weakness, memory loss, numbness & tingling, skin/hair/nail changes, abnormal bruising or bleeding, anxiety, or depression.     Objective:   Physical Exam General Appearance:    Alert, cooperative, no distress, appears stated age  Head:    Normocephalic, without obvious abnormality, atraumatic  Eyes:    PERRL, conjunctiva/corneas clear, EOM's intact, fundi    benign, both eyes  Ears:    Normal TM's and external ear canals, both ears  Nose:   Nares normal, septum midline, mucosa normal, no drainage    or sinus tenderness  Throat:   Lips, mucosa, and tongue normal; teeth and gums normal  Neck:   Supple, symmetrical, trachea midline, no adenopathy;    Thyroid: no enlargement/tenderness/nodules  Back:     Symmetric, no curvature, ROM normal, no CVA tenderness  Lungs:     Clear to auscultation bilaterally, respirations unlabored  Chest Wall:    No tenderness or deformity   Heart:    Regular rate and rhythm, S1 and S2 normal, no murmur, rub   or gallop  Breast Exam:    Deferred to GYN  Abdomen:     Soft, non-tender, bowel sounds active all four quadrants,    no masses, no organomegaly  Genitalia:    Deferred to GYN  Rectal:    Extremities:   Extremities normal, atraumatic, no cyanosis or edema  Pulses:   2+ and symmetric all  extremities  Skin:   Skin color, texture, turgor normal, no rashes or lesions  Lymph nodes:   Cervical, supraclavicular, and axillary nodes normal  Neurologic:   CNII-XII intact, normal strength, sensation and reflexes    throughout          Assessment & Plan:

## 2018-03-25 NOTE — Assessment & Plan Note (Signed)
Pt has hx of this.  Check labs and replete prn. 

## 2018-03-25 NOTE — Telephone Encounter (Signed)
Medication refill request: Provera 2.5 mg tab  Last AEX:  03/04/17 Next AEX: 04/08/18 Last MMG (if hormonal medication request): 03/23/18 Bi rads 1 neg  Refill authorized:

## 2018-03-25 NOTE — Assessment & Plan Note (Signed)
Chronic problem, tolerating statin w/o difficulty.  Check labs.  Adjust meds prn  

## 2018-03-30 DIAGNOSIS — M5032 Other cervical disc degeneration, mid-cervical region, unspecified level: Secondary | ICD-10-CM | POA: Diagnosis not present

## 2018-03-30 DIAGNOSIS — M9901 Segmental and somatic dysfunction of cervical region: Secondary | ICD-10-CM | POA: Diagnosis not present

## 2018-03-30 DIAGNOSIS — M47812 Spondylosis without myelopathy or radiculopathy, cervical region: Secondary | ICD-10-CM | POA: Diagnosis not present

## 2018-04-02 DIAGNOSIS — M5032 Other cervical disc degeneration, mid-cervical region, unspecified level: Secondary | ICD-10-CM | POA: Diagnosis not present

## 2018-04-02 DIAGNOSIS — M47812 Spondylosis without myelopathy or radiculopathy, cervical region: Secondary | ICD-10-CM | POA: Diagnosis not present

## 2018-04-02 DIAGNOSIS — M9901 Segmental and somatic dysfunction of cervical region: Secondary | ICD-10-CM | POA: Diagnosis not present

## 2018-04-08 ENCOUNTER — Ambulatory Visit (INDEPENDENT_AMBULATORY_CARE_PROVIDER_SITE_OTHER): Payer: 59 | Admitting: Certified Nurse Midwife

## 2018-04-08 ENCOUNTER — Encounter: Payer: Self-pay | Admitting: Certified Nurse Midwife

## 2018-04-08 ENCOUNTER — Other Ambulatory Visit: Payer: Self-pay

## 2018-04-08 VITALS — BP 110/70 | HR 68 | Resp 16 | Ht 62.25 in | Wt 134.0 lb

## 2018-04-08 DIAGNOSIS — Z124 Encounter for screening for malignant neoplasm of cervix: Secondary | ICD-10-CM

## 2018-04-08 DIAGNOSIS — Z79890 Hormone replacement therapy (postmenopausal): Secondary | ICD-10-CM

## 2018-04-08 DIAGNOSIS — N951 Menopausal and female climacteric states: Secondary | ICD-10-CM | POA: Diagnosis not present

## 2018-04-08 DIAGNOSIS — Z01419 Encounter for gynecological examination (general) (routine) without abnormal findings: Secondary | ICD-10-CM

## 2018-04-08 NOTE — Progress Notes (Signed)
62 y.o. G58P0003 Married  Caucasian Fe here for annual exam. Menopausal on HRT, denies vaginal bleeding or vaginal dryness. Went back on HRT after trying to stop. Hot flashes were numerous and had sleep issues. Still having sleep issues, with nocturia, but better. Continues with Dr. Beverely Low with management of cholesterol,Vitamin D,hypothyroid and anxiety management. All stable with recent labs. Has chronic sinus issues using Neti pot with fair results. Working on house to be able to move in the next year. No other health issues today.  Patient's last menstrual period was 03/10/2010.          Sexually active: No.  The current method of family planning is tubal ligation.    Exercising: Yes.    walking Smoker:  no  Review of Systems  Constitutional: Negative.   HENT: Negative.   Eyes: Negative.   Respiratory: Negative.   Cardiovascular: Negative.   Gastrointestinal:       Bloating  Genitourinary: Negative.   Musculoskeletal: Negative.   Skin: Negative.   Neurological: Negative.   Endo/Heme/Allergies: Negative.   Psychiatric/Behavioral: Negative.     Health Maintenance: Pap:  01-16-15 neg HPV HR neg History of Abnormal Pap: no MMG:  03-23-18 category c density birads 1:neg Self Breast exams: yes Colonoscopy:  2015 f/u 15yrs BMD:   10/18 TDaP:  2015 Shingles: no Pneumonia: no Hep C and HIV: both neg per patient Labs: with pcp   reports that she quit smoking about 13 years ago. She has a 12.50 pack-year smoking history. She has never used smokeless tobacco. She reports that she drinks about 5.0 standard drinks of alcohol per week. She reports that she does not use drugs.  Past Medical History:  Diagnosis Date  . Allergy    Dogs, dust, and mold  . Anxiety   . Arthritis   . Benign essential HTN 08/31/2015  . Chronic neck pain   . Colon polyps   . Depression   . Heart murmur   . Heart murmur   . Hypercholesteremia   . Hyperlipidemia 05/2005  . Hypothyroid 06/2009  .  Osteoarthritis of thoracic spine   . Shingles 06/2011    Past Surgical History:  Procedure Laterality Date  . COLONOSCOPY  2009  . TUBAL LIGATION  1984 & 05/2005   reversal in 1995  . tubal reversal      Current Outpatient Medications  Medication Sig Dispense Refill  . albuterol (PROVENTIL HFA;VENTOLIN HFA) 108 (90 Base) MCG/ACT inhaler Inhale 2 puffs into the lungs every 6 (six) hours as needed for wheezing or shortness of breath. 1 Inhaler 2  . ALPRAZolam (XANAX) 0.5 MG tablet Take 0.5 mg by mouth 3 (three) times daily as needed for anxiety.     . Biotin 1 MG CAPS Take by mouth.    . calcium carbonate (TUMS - DOSED IN MG ELEMENTAL CALCIUM) 500 MG chewable tablet Chew 1 tablet by mouth 3 (three) times daily as needed for indigestion or heartburn.    . cholecalciferol (VITAMIN D) 1000 units tablet Take 1,000 Units by mouth daily.     . Cyanocobalamin (VITAMIN B-12 PO) Take 1,000 mg by mouth daily.     Marland Kitchen estradiol (ESTRACE) 0.5 MG tablet TAKE ONE-HALF TABLET BY  MOUTH DAILY 45 tablet 0  . fluticasone (FLONASE) 50 MCG/ACT nasal spray Place 2 sprays into both nostrils daily. 48 g 1  . levothyroxine (SYNTHROID, LEVOTHROID) 50 MCG tablet TAKE 1 TABLET BY MOUTH  DAILY BEFORE BREAKFAST 90 tablet 1  . meclizine (  ANTIVERT) 25 MG tablet Take 1 tablet (25 mg total) by mouth 3 (three) times daily as needed for dizziness. 30 tablet 0  . medroxyPROGESTERone (PROVERA) 2.5 MG tablet Take 1 tablet (2.5 mg total) by mouth daily. 90 tablet 0  . meloxicam (MOBIC) 15 MG tablet Take 1 tablet (15 mg total) by mouth daily. 30 tablet 1  . rosuvastatin (CRESTOR) 5 MG tablet TAKE 1 TABLET BY MOUTH  DAILY 90 tablet 1  . sertraline (ZOLOFT) 25 MG tablet TAKE 1 TABLET BY MOUTH  DAILY 90 tablet 1  . sodium chloride (MURO 128) 5 % ophthalmic ointment Apply to eye.     No current facility-administered medications for this visit.     Family History  Problem Relation Age of Onset  . Thyroid disease Mother   . Lung  cancer Mother   . CVA Father   . Diabetes Father   . Hyperlipidemia Father   . Hashimoto's thyroiditis Sister   . CVA Paternal Grandfather   . CVA Paternal Grandmother   . Breast cancer Neg Hx     ROS:  Pertinent items are noted in HPI.  Otherwise, a comprehensive ROS was negative.  Exam:   BP 110/70   Pulse 68   Resp 16   Ht 5' 2.25" (1.581 m)   Wt 134 lb (60.8 kg)   LMP 03/10/2010   BMI 24.31 kg/m  Height: 5' 2.25" (158.1 cm) Ht Readings from Last 3 Encounters:  04/08/18 5' 2.25" (1.581 m)  03/25/18 5\' 2"  (1.575 m)  12/05/17 5\' 2"  (1.575 m)    General appearance: alert, cooperative and appears stated age Head: Normocephalic, without obvious abnormality, atraumatic Neck: no adenopathy, supple, symmetrical, trachea midline and thyroid normal to inspection and palpation Lungs: clear to auscultation bilaterally Breasts: normal appearance, no masses or tenderness, No nipple retraction or dimpling, No nipple discharge or bleeding, No axillary or supraclavicular adenopathy Heart: regular rate and rhythm Abdomen: soft, non-tender; no masses,  no organomegaly Extremities: extremities normal, atraumatic, no cyanosis or edema Skin: Skin color, texture, turgor normal. No rashes or lesions Lymph nodes: Cervical, supraclavicular, and axillary nodes normal. No abnormal inguinal nodes palpated Neurologic: Grossly normal   Pelvic: External genitalia:  no lesions              Urethra:  normal appearing urethra with no masses, tenderness or lesions              Bartholin's and Skene's: normal                 Vagina: normal appearing vagina with normal color and discharge, no lesions              Cervix: no cervical motion tenderness and no lesions              Pap taken: Yes.   Bimanual Exam:  Uterus:  normal size, contour, position, consistency, mobility, non-tender and anteverted              Adnexa: normal adnexa and no mass, fullness, tenderness               Rectovaginal:  Confirms               Anus:  normal sphincter tone, no lesions  Chaperone present: yes  A:  Well Woman with normal exam  Menopausal on HRT has decreased dose, desires continuance  Cholesterol,hypothyroid, Vitamin D,depression with PCP management of medication.   P:   Reviewed health and  wellness pertinent to exam  Aware of need to advise if vaginal bleeding  Discussed risks/benefits/warning signs with use of HRT. Desires continuance and then will try to wean down again if she feels ready.  Rx Estrace 0.5mg  1/2 tablet with instructions  Rx Provera 2.5 1/2 tablet see order with instructions  Patient will  Advise if change in status with HRT.  Continue follow up with PCP as indicated  Pap smear: yes   counseled on breast self exam, mammography screening, use and side effects of HRT, osteoporosis, adequate intake of calcium and vitamin D, diet and exercise return annually or prn  An After Visit Summary was printed and given to the patient.

## 2018-04-08 NOTE — Patient Instructions (Signed)

## 2018-04-09 ENCOUNTER — Other Ambulatory Visit (HOSPITAL_COMMUNITY)
Admission: RE | Admit: 2018-04-09 | Discharge: 2018-04-09 | Disposition: A | Discharge status: Home / Self Care | Payer: 59 | Source: Ambulatory Visit | Attending: Certified Nurse Midwife | Admitting: Certified Nurse Midwife

## 2018-04-09 DIAGNOSIS — Z124 Encounter for screening for malignant neoplasm of cervix: Secondary | ICD-10-CM | POA: Diagnosis not present

## 2018-04-09 NOTE — Addendum Note (Signed)
Addended by: Verner Chol on: 04/09/2018 01:05 PM   Modules accepted: Orders

## 2018-04-13 LAB — CYTOLOGY - PAP
Diagnosis: NEGATIVE
HPV: NOT DETECTED

## 2018-04-16 DIAGNOSIS — M47812 Spondylosis without myelopathy or radiculopathy, cervical region: Secondary | ICD-10-CM | POA: Diagnosis not present

## 2018-04-16 DIAGNOSIS — M5032 Other cervical disc degeneration, mid-cervical region, unspecified level: Secondary | ICD-10-CM | POA: Diagnosis not present

## 2018-04-16 DIAGNOSIS — M9901 Segmental and somatic dysfunction of cervical region: Secondary | ICD-10-CM | POA: Diagnosis not present

## 2018-05-12 ENCOUNTER — Other Ambulatory Visit: Payer: Self-pay | Admitting: General Practice

## 2018-05-12 MED ORDER — FLUTICASONE PROPIONATE 50 MCG/ACT NA SUSP: 2.0000 | Freq: Every day | NASAL | 1 refills | Status: DC

## 2018-05-15 DIAGNOSIS — M5032 Other cervical disc degeneration, mid-cervical region, unspecified level: Secondary | ICD-10-CM | POA: Diagnosis not present

## 2018-05-15 DIAGNOSIS — M9901 Segmental and somatic dysfunction of cervical region: Secondary | ICD-10-CM | POA: Diagnosis not present

## 2018-05-15 DIAGNOSIS — M47812 Spondylosis without myelopathy or radiculopathy, cervical region: Secondary | ICD-10-CM | POA: Diagnosis not present

## 2018-05-19 ENCOUNTER — Other Ambulatory Visit: Payer: Self-pay | Admitting: Certified Nurse Midwife

## 2018-05-20 NOTE — Telephone Encounter (Signed)
I spoke with the patient and she states that she does not need a refill on her provera 2.5 mg tab. She is to call when she gets low and we will refill then if appropriate.

## 2018-05-21 DIAGNOSIS — M47812 Spondylosis without myelopathy or radiculopathy, cervical region: Secondary | ICD-10-CM | POA: Diagnosis not present

## 2018-05-21 DIAGNOSIS — M5032 Other cervical disc degeneration, mid-cervical region, unspecified level: Secondary | ICD-10-CM | POA: Diagnosis not present

## 2018-05-21 DIAGNOSIS — M9901 Segmental and somatic dysfunction of cervical region: Secondary | ICD-10-CM | POA: Diagnosis not present

## 2018-05-25 DIAGNOSIS — M25551 Pain in right hip: Secondary | ICD-10-CM | POA: Diagnosis not present

## 2018-06-04 ENCOUNTER — Other Ambulatory Visit: Payer: Self-pay | Admitting: Certified Nurse Midwife

## 2018-06-05 NOTE — Telephone Encounter (Signed)
Medication refill request: Provera Last AEX:  04/08/18 DL Next AEX: 56/07/1309/4/20 Last MMG (if hormonal medication request): 03/23/18 BIRADS 1 negative/density c Refill authorized: 03/25/18 #90 w/0 refills; today please advise; DL out office 08/65/7812/27/19

## 2018-06-05 NOTE — Telephone Encounter (Signed)
Please confirm her dosages of her HRT.  It looks like she was decreasing her estrogen and progesterone by 1/2 and that Eunice BlaseDebbie gave her a prescription in October that was enough for 6 months?

## 2018-06-09 NOTE — Telephone Encounter (Signed)
Call to patient. Patient states that she is taking "a whole tablet of provera- 2.5mg ." States she is taking 1/2 of the estradiol. Patient states "I have a lot here, so I'm good right now. Do not need a refill." RN advised would update [facility] that patient does not need a refill at this time. Patient agreeable.   Routing to provider and will close encounter.

## 2018-06-10 ENCOUNTER — Other Ambulatory Visit: Payer: Self-pay | Admitting: Family Medicine

## 2018-06-26 DIAGNOSIS — M25561 Pain in right knee: Secondary | ICD-10-CM | POA: Diagnosis not present

## 2018-07-23 DIAGNOSIS — M25551 Pain in right hip: Secondary | ICD-10-CM | POA: Diagnosis not present

## 2018-08-12 ENCOUNTER — Encounter: Payer: 59 | Admitting: Family Medicine

## 2018-08-12 ENCOUNTER — Encounter

## 2018-09-07 ENCOUNTER — Other Ambulatory Visit: Payer: Self-pay | Admitting: Certified Nurse Midwife

## 2018-09-07 MED ORDER — ESTRADIOL 0.5 MG PO TABS: 0.2500 mg | ORAL_TABLET | Freq: Every day | ORAL | 0 refills | Status: DC

## 2018-09-07 NOTE — Telephone Encounter (Signed)
Medication refill request: Estradiol Last AEX:  04/08/18 DL Next AEX: 38/1/01 Last MMG (if hormonal medication request): 03/23/18 BIRADS 1 negative/density c Refill authorized: 03/05/18 #45 w/0 refills; Today order pended for #45 w/0 refills if authorized

## 2018-09-07 NOTE — Telephone Encounter (Signed)
Patient requesting a refill on estradiol sent to [facility].

## 2018-09-24 ENCOUNTER — Encounter: Payer: Self-pay | Admitting: Family Medicine

## 2018-09-24 ENCOUNTER — Other Ambulatory Visit: Payer: Self-pay

## 2018-09-24 ENCOUNTER — Ambulatory Visit (INDEPENDENT_AMBULATORY_CARE_PROVIDER_SITE_OTHER): Payer: 59 | Admitting: Family Medicine

## 2018-09-24 VITALS — Ht 62.25 in | Wt 134.0 lb

## 2018-09-24 DIAGNOSIS — F419 Anxiety disorder, unspecified: Secondary | ICD-10-CM

## 2018-09-24 DIAGNOSIS — E039 Hypothyroidism, unspecified: Secondary | ICD-10-CM

## 2018-09-24 DIAGNOSIS — E785 Hyperlipidemia, unspecified: Secondary | ICD-10-CM

## 2018-09-24 MED ORDER — ALPRAZOLAM 0.5 MG PO TABS: 0.5000 mg | ORAL_TABLET | Freq: Three times a day (TID) | ORAL | 1 refills | Status: DC | PRN

## 2018-09-24 NOTE — Progress Notes (Signed)
Virtual Visit via Video   I connected with Sonia Kim on 09/24/18 at  8:40 AM EDT by a video enabled telemedicine application and verified that I am speaking with the correct person using two identifiers. Location patient: Home Location provider: [facility], Office Persons participating in the virtual visit: pt and myself  I discussed the limitations of evaluation and management by telemedicine and the availability of in person appointments. The patient expressed understanding and agreed to proceed.  Interactive audio and video telecommunications were attempted between this provider and patient, however failed, due to patient having technical difficulties OR patient did not have access to video capability.  We continued and completed visit with audio only.   Subjective:   HPI:  Hyperlipidemia- chronic problem, on Crestor 5mg  daily.  Weight is stable.  'i'm doing really good.  I feel great'.  Walking ~3 miles/day, yard work.  No CP, SOB, HAs, visual changes, abd pain, N/V.  Hypothyroid- chronic problem, on Levothyroxine 50mcg daily.  Energy level is good.  Denies changes to skin/hair/nails.  Anxiety- pt is having increased anxiety b/c children are in [place] and [place].  Husband is still working.  She is taking 1/4 Alprazolam nightly to improve sleep.  ROS: See pertinent positives and negatives per HPI.  Patient Active Problem List   Diagnosis Date Noted  . Shingles 04/25/2017  . Allergic rhinitis 04/10/2017  . Physical exam 02/14/2017  . Hyperlipidemia 08/14/2016  . Anxiety 08/14/2016  . Nonallopathic lesion of cervical region 06/06/2016  . Nonallopathic lesion of thoracic region 06/06/2016  . Nonallopathic lesion of lumbosacral region 06/06/2016  . Nonallopathic lesion of sacral region 06/06/2016  . Polyarthralgia 05/09/2016  . Strain of left piriformis muscle 04/09/2016  . Heart palpitations 08/31/2015  . Heart murmur 08/31/2015  . Greater trochanteric bursitis of  both hips 04/05/2015  . Postmenopausal hormone replacement therapy 01/16/2015  . Hypothyroid 01/16/2015  . Vitamin D deficiency 01/16/2015    Social History   Tobacco Use  . Smoking status: Former Smoker    Packs/day: 0.50    Years: 25.00    Pack years: 12.50    Last attempt to quit: 02/25/2005    Years since quitting: 13.5  . Smokeless tobacco: Never Used  Substance Use Topics  . Alcohol use: Yes    Alcohol/week: 5.0 standard drinks    Types: 5 Glasses of wine per week    Current Outpatient Medications:  .  albuterol (PROVENTIL HFA;VENTOLIN HFA) 108 (90 Base) MCG/ACT inhaler, Inhale 2 puffs into the lungs every 6 (six) hours as needed for wheezing or shortness of breath., Disp: 1 Inhaler, Rfl: 2 .  ALPRAZolam (XANAX) 0.5 MG tablet, Take 0.5 mg by mouth 3 (three) times daily as needed for anxiety. , Disp: , Rfl:  .  Biotin 1 MG CAPS, Take by mouth., Disp: , Rfl:  .  calcium carbonate (TUMS - DOSED IN MG ELEMENTAL CALCIUM) 500 MG chewable tablet, Chew 1 tablet by mouth 3 (three) times daily as needed for indigestion or heartburn., Disp: , Rfl:  .  cholecalciferol (VITAMIN D) 1000 units tablet, Take 1,000 Units by mouth daily. , Disp: , Rfl:  .  Cyanocobalamin (VITAMIN B-12 PO), Take 1,000 mg by mouth daily. , Disp: , Rfl:  .  estradiol (ESTRACE) 0.5 MG tablet, Take 0.5 tablets (0.25 mg total) by mouth daily., Disp: 45 tablet, Rfl: 0 .  fluticasone (FLONASE) 50 MCG/ACT nasal spray, Place 2 sprays into both nostrils daily., Disp: 48 g, Rfl: 1 .  levothyroxine (SYNTHROID, LEVOTHROID) 50 MCG tablet, TAKE 1 TABLET BY MOUTH  DAILY BEFORE BREAKFAST, Disp: 90 tablet, Rfl: 1 .  meclizine (ANTIVERT) 25 MG tablet, Take 1 tablet (25 mg total) by mouth 3 (three) times daily as needed for dizziness., Disp: 30 tablet, Rfl: 0 .  medroxyPROGESTERone (PROVERA) 2.5 MG tablet, Take 1 tablet (2.5 mg total) by mouth daily., Disp: 90 tablet, Rfl: 0 .  meloxicam (MOBIC) 15 MG tablet, TAKE 1 TABLET BY MOUTH  EVERY DAY, Disp: 30 tablet, Rfl: 1 .  rosuvastatin (CRESTOR) 5 MG tablet, TAKE 1 TABLET BY MOUTH  DAILY, Disp: 90 tablet, Rfl: 1 .  sertraline (ZOLOFT) 25 MG tablet, TAKE 1 TABLET BY MOUTH  DAILY, Disp: 90 tablet, Rfl: 1 .  sodium chloride (MURO 128) 5 % ophthalmic ointment, Apply to eye., Disp: , Rfl:   Allergies  Allergen Reactions  . Lisinopril Cough  . Oxycodone-Acetaminophen Nausea Only    Objective:   Ht 5' 2.25" (1.581 m)   Wt 134 lb (60.8 kg)   LMP 03/10/2010   BMI 24.31 kg/m  Pt is able to speak clearly, coherently without shortness of breath or increased work of breathing.  Thought process is linear.  Mood is appropriate.   Assessment and Plan:   Hyperlipidemia- chronic problem.  Tolerating statin w/o difficulty.  Check labs.  Adjust meds prn  Hypothyroid- currently asymptomatic.  Check labs.  Adjust meds prn   Anxiety- deteriorated w/ COVID.  Will refill her Alprazolam to take 1/4-1/2 tab nightly.  Pt appreciative.  Neena Rhymes, MD 09/24/2018

## 2018-09-24 NOTE — Progress Notes (Signed)
I have discussed the procedure for the virtual visit with the patient who has given consent to proceed with assessment and treatment.   Bryana Froemming, CMA     

## 2018-09-29 ENCOUNTER — Other Ambulatory Visit (INDEPENDENT_AMBULATORY_CARE_PROVIDER_SITE_OTHER): Payer: 59

## 2018-09-29 DIAGNOSIS — E039 Hypothyroidism, unspecified: Secondary | ICD-10-CM | POA: Diagnosis not present

## 2018-09-29 DIAGNOSIS — E785 Hyperlipidemia, unspecified: Secondary | ICD-10-CM | POA: Diagnosis not present

## 2018-09-29 LAB — LIPID PANEL
Cholesterol: 172 mg/dL (ref 0–200)
HDL: 47.6 mg/dL (ref >39.00)
LDL Cholesterol: 101 mg/dL — ABNORMAL HIGH (ref 0–99)
NonHDL: 124.77
Total CHOL/HDL Ratio: 4
Triglycerides: 121 mg/dL (ref 0.0–149.0)
VLDL: 24.2 mg/dL (ref 0.0–40.0)

## 2018-09-29 LAB — BASIC METABOLIC PANEL
BUN: 16 mg/dL (ref 6–23)
CO2: 28 mEq/L (ref 19–32)
Calcium: 9.1 mg/dL (ref 8.4–10.5)
Chloride: 104 mEq/L (ref 96–112)
Creatinine, Ser: 0.77 mg/dL (ref 0.40–1.20)
GFR: 75.91 mL/min (ref >60.00)
Glucose, Bld: 97 mg/dL (ref 70–99)
Potassium: 4 mEq/L (ref 3.5–5.1)
Sodium: 140 mEq/L (ref 135–145)

## 2018-09-29 LAB — HEPATIC FUNCTION PANEL
ALT: 10 U/L (ref 0–35)
AST: 17 U/L (ref 0–37)
Albumin: 4.3 g/dL (ref 3.5–5.2)
Alkaline Phosphatase: 55 U/L (ref 39–117)
Bilirubin, Direct: 0.1 mg/dL (ref 0.0–0.3)
Total Bilirubin: 0.7 mg/dL (ref 0.2–1.2)
Total Protein: 6.2 g/dL (ref 6.0–8.3)

## 2018-09-29 LAB — TSH: TSH: 3.15 u[IU]/mL (ref 0.35–4.50)

## 2018-10-13 ENCOUNTER — Other Ambulatory Visit: Payer: Self-pay | Admitting: Certified Nurse Midwife

## 2018-10-13 MED ORDER — MEDROXYPROGESTERONE ACETATE 2.5 MG PO TABS: 2.5000 mg | ORAL_TABLET | Freq: Every day | ORAL | 0 refills | Status: DC

## 2018-10-13 NOTE — Telephone Encounter (Signed)
Patient requesting refill on provera. [facility] [id number].

## 2018-10-13 NOTE — Telephone Encounter (Signed)
Medication refill request: provera 2.5mg  Last AEX:  04-08-18 Next AEX: 04-14-2019 Last MMG (if hormonal medication request): 03-23-18 category c density birads 1:neg Refill authorized: takes 1/2 daily. please approve if appropriate

## 2018-10-14 ENCOUNTER — Other Ambulatory Visit: Payer: Self-pay | Admitting: Family Medicine

## 2018-11-05 ENCOUNTER — Other Ambulatory Visit: Payer: Self-pay

## 2018-11-05 ENCOUNTER — Ambulatory Visit: Payer: Self-pay

## 2018-11-05 ENCOUNTER — Encounter: Payer: Self-pay | Admitting: Family Medicine

## 2018-11-05 ENCOUNTER — Ambulatory Visit (INDEPENDENT_AMBULATORY_CARE_PROVIDER_SITE_OTHER): Payer: 59 | Admitting: Family Medicine

## 2018-11-05 VITALS — BP 166/78 | HR 85 | Temp 98.6°F

## 2018-11-05 DIAGNOSIS — R030 Elevated blood-pressure reading, without diagnosis of hypertension: Secondary | ICD-10-CM | POA: Diagnosis not present

## 2018-11-05 DIAGNOSIS — J3089 Other allergic rhinitis: Secondary | ICD-10-CM | POA: Diagnosis not present

## 2018-11-05 DIAGNOSIS — R0602 Shortness of breath: Secondary | ICD-10-CM

## 2018-11-05 MED ORDER — AZELASTINE HCL 0.1 % NA SOLN: 2.0000 | Freq: Two times a day (BID) | NASAL | 12 refills | Status: DC

## 2018-11-05 MED ORDER — CETIRIZINE HCL 10 MG PO TABS: 10.0000 mg | ORAL_TABLET | Freq: Every day | ORAL | 11 refills | Status: DC

## 2018-11-05 NOTE — Telephone Encounter (Signed)
Pt called stating that for 2 months she has had a dry cough see states from allergies.  She states that she feels SOB or like she can't get a deep breath. She states that she wheezes after a deep cough. She denies any known exposure to COVID-19.  She has no fever and states she often has allergies. Care advice read to patient. She verbalized understanding of all instructions. Call transferred to office for scheduling.  Reason for Disposition . Cough present > 3 weeks  Answer Assessment - Initial Assessment Questions 1. COVID-19 DIAGNOSIS: "Who made your Coronavirus (COVID-19) diagnosis?" "Was it confirmed by a positive lab test?" If not diagnosed by a HCP, ask "Are there lots of cases (community spread) where you live?" (See public health department website, if unsure)   * MAJOR community spread: high number of cases; numbers of cases are increasing; many people hospitalized.   * MINOR community spread: low number of cases; not increasing; few or no people hospitalized     [place] 2. ONSET: "When did the COVID-19 symptoms start?"      About 2 months ago 3. WORST SYMPTOM: "What is your worst symptom?" (e.g., cough, fever, shortness of breath, muscle aches)     SOB not able to take a deep breath 4. COUGH: "Do you have a cough?" If so, ask: "How bad is the cough?"       Dry cough 5. FEVER: "Do you have a fever?" If so, ask: "What is your temperature, how was it measured, and when did it start?"     No 6. RESPIRATORY STATUS: "Describe your breathing?" (e.g., shortness of breath, wheezing, unable to speak)      SOB wheezy 7. BETTER-SAME-WORSE: "Are you getting better, staying the same or getting worse compared to yesterday?"  If getting worse, ask, "In what way?"    same 8. HIGH RISK DISEASE: "Do you have any chronic medical problems?" (e.g., asthma, heart or lung disease, weak immune system, etc.)     no 9. PREGNANCY: "Is there any chance you are pregnant?" "When was your last menstrual  period?"    N/A 10. OTHER SYMPTOMS: "Do you have any other symptoms?"  (e.g., runny nose, headache, sore throat, loss of smell)       Sore throat  Protocols used: CORONAVIRUS (COVID-19) DIAGNOSED OR SUSPECTED-A-AH

## 2018-11-05 NOTE — Progress Notes (Signed)
Virtual Visit via Video   I connected with patient on 11/05/18 at 10:20 AM EDT by a video enabled telemedicine application and verified that I am speaking with the correct person using two identifiers.  Location patient: Home Location provider: [facility], Office Persons participating in the virtual visit: Patient, Provider, CMA (Jess B)  I discussed the limitations of evaluation and management by telemedicine and the availability of in person appointments. The patient expressed understanding and agreed to proceed.  Subjective:   HPI:   SOB- pt reports 'i'm stuffy all the time, i'm hoarse all the time, I cough all the time'.  sxs worsen when she is outside.  Mom died of lung cancer.  Pt is former smoker.  sxs occur year round, worse in the last 2-3 months.  Currently on Allegra and Flonase.  Has had allergy testing and knows she is allergic to dust and mold.  Pt saw an allergist ~3 yrs ago (Dr Barnetta Chapel).  Pt reports SOB improves w/ albuterol use which she typically uses 2x/week after yard work.  No relief w/ Singulair- caused excessive sedation.  Elevated BP- pt reports BP is elevated today.  She is not sure why.  Currently asymptomatic but admits to anxiety regarding shortness of breath concern  ROS:   See pertinent positives and negatives per HPI.  Patient Active Problem List   Diagnosis Date Noted  . Shingles 04/25/2017  . Allergic rhinitis 04/10/2017  . Physical exam 02/14/2017  . Hyperlipidemia 08/14/2016  . Anxiety 08/14/2016  . Nonallopathic lesion of cervical region 06/06/2016  . Nonallopathic lesion of thoracic region 06/06/2016  . Nonallopathic lesion of lumbosacral region 06/06/2016  . Nonallopathic lesion of sacral region 06/06/2016  . Polyarthralgia 05/09/2016  . Strain of left piriformis muscle 04/09/2016  . Heart palpitations 08/31/2015  . Heart murmur 08/31/2015  . Greater trochanteric bursitis of both hips 04/05/2015  . Postmenopausal hormone  replacement therapy 01/16/2015  . Hypothyroid 01/16/2015  . Vitamin D deficiency 01/16/2015    Social History   Tobacco Use  . Smoking status: Former Smoker    Packs/day: 0.50    Years: 25.00    Pack years: 12.50    Last attempt to quit: 02/25/2005    Years since quitting: 13.7  . Smokeless tobacco: Never Used  Substance Use Topics  . Alcohol use: Yes    Alcohol/week: 5.0 standard drinks    Types: 5 Glasses of wine per week    Current Outpatient Medications:  .  albuterol (PROVENTIL HFA;VENTOLIN HFA) 108 (90 Base) MCG/ACT inhaler, Inhale 2 puffs into the lungs every 6 (six) hours as needed for wheezing or shortness of breath., Disp: 1 Inhaler, Rfl: 2 .  ALPRAZolam (XANAX) 0.5 MG tablet, Take 1 tablet (0.5 mg total) by mouth 3 (three) times daily as needed for anxiety., Disp: 30 tablet, Rfl: 1 .  Biotin 1 MG CAPS, Take by mouth., Disp: , Rfl:  .  calcium carbonate (TUMS - DOSED IN MG ELEMENTAL CALCIUM) 500 MG chewable tablet, Chew 1 tablet by mouth 3 (three) times daily as needed for indigestion or heartburn., Disp: , Rfl:  .  cholecalciferol (VITAMIN D) 1000 units tablet, Take 1,000 Units by mouth daily. , Disp: , Rfl:  .  Cyanocobalamin (VITAMIN B-12 PO), Take 1,000 mg by mouth daily. , Disp: , Rfl:  .  estradiol (ESTRACE) 0.5 MG tablet, Take 0.5 tablets (0.25 mg total) by mouth daily., Disp: 45 tablet, Rfl: 0 .  fluticasone (FLONASE) 50 MCG/ACT nasal spray,  Place 2 sprays into both nostrils daily., Disp: 48 g, Rfl: 1 .  levothyroxine (SYNTHROID, LEVOTHROID) 50 MCG tablet, TAKE 1 TABLET BY MOUTH  DAILY BEFORE BREAKFAST, Disp: 90 tablet, Rfl: 1 .  meclizine (ANTIVERT) 25 MG tablet, Take 1 tablet (25 mg total) by mouth 3 (three) times daily as needed for dizziness., Disp: 30 tablet, Rfl: 0 .  medroxyPROGESTERone (PROVERA) 2.5 MG tablet, Take 1 tablet (2.5 mg total) by mouth daily., Disp: 90 tablet, Rfl: 0 .  meloxicam (MOBIC) 15 MG tablet, TAKE 1 TABLET BY MOUTH EVERY DAY, Disp: 30  tablet, Rfl: 1 .  rosuvastatin (CRESTOR) 5 MG tablet, TAKE 1 TABLET BY MOUTH  DAILY, Disp: 90 tablet, Rfl: 1 .  sertraline (ZOLOFT) 25 MG tablet, TAKE 1 TABLET BY MOUTH  DAILY, Disp: 90 tablet, Rfl: 1 .  sodium chloride (MURO 128) 5 % ophthalmic ointment, Apply to eye., Disp: , Rfl:   Allergies  Allergen Reactions  . Lisinopril Cough  . Oxycodone-Acetaminophen Nausea Only    Objective:   BP (!) 187/97   Pulse 85   Temp 98.6 F (37 C) (Oral)   LMP 03/10/2010  AAOx3, NAD NCAT, EOMI No obvious CN deficits Coloring WNL + nasal congestion and PND (throat clearing) heard Pt is able to speak clearly, coherently without shortness of breath or increased work of breathing.  Thought process is linear.  Mood is appropriate.   Assessment and Plan:   Allergies- pt now reports perennial sxs.  Switch from Allegra to Zyrtec.  Add Astelin.  Refer back to allergist for complete evaluation and tx.  Pt expressed understanding and is in agreement w/ plan.   SOB- mild.  Improves w/ use of inhaler.  Suspect this is allergy triggered RAD.  Changes as above.  Refer to allergist.  Pt expressed understanding and is in agreement w/ plan.   Elevated BP- new.  Pt doesn't have hx of HTN but BP is elevated today.  Admits to increased anxiety recently.  Pt to monitor BP at home and report if consistently >140/90.  Will f/u in 2 weeks to reassess.  Pt expressed understanding and is in agreement w/ plan.    Neena RhymesKatherine Annelie Boak, MD 11/05/2018

## 2018-11-06 ENCOUNTER — Other Ambulatory Visit: Payer: Self-pay | Admitting: Family Medicine

## 2018-11-09 ENCOUNTER — Telehealth: Payer: Self-pay | Admitting: Family Medicine

## 2018-11-09 NOTE — Telephone Encounter (Signed)
Called and left a detailed message. advised pt that she can purchase a pulse oximeter at [facility], any local pharmacy or [employer].

## 2018-11-09 NOTE — Telephone Encounter (Signed)
PT wanted to know is there anyway she can get her O2 level checked. She is willing to go buy something if she can't get into the office. Pt can be reached at her home #

## 2018-11-19 ENCOUNTER — Encounter: Payer: Self-pay | Admitting: Family Medicine

## 2018-11-19 ENCOUNTER — Ambulatory Visit (INDEPENDENT_AMBULATORY_CARE_PROVIDER_SITE_OTHER): Payer: 59 | Admitting: Family Medicine

## 2018-11-19 ENCOUNTER — Other Ambulatory Visit: Payer: Self-pay

## 2018-11-19 DIAGNOSIS — I10 Essential (primary) hypertension: Secondary | ICD-10-CM | POA: Diagnosis not present

## 2018-11-19 MED ORDER — HYDROCHLOROTHIAZIDE 12.5 MG PO TABS: 12.5000 mg | ORAL_TABLET | Freq: Every day | ORAL | 3 refills | Status: DC

## 2018-11-19 NOTE — Progress Notes (Signed)
I have discussed the procedure for the virtual visit with the patient who has given consent to proceed with assessment and treatment.   Sonia Kim, CMA     

## 2018-11-19 NOTE — Progress Notes (Signed)
Virtual Visit via Video   I connected with patient on 11/19/18 at  9:40 AM EDT by a video enabled telemedicine application and verified that I am speaking with the correct person using two identifiers.  Location patient: Home Location provider: [facility], Office Persons participating in the virtual visit: Patient, Provider, [place] (Jess B)  I discussed the limitations of evaluation and management by telemedicine and the availability of in person appointments. The patient expressed understanding and agreed to proceed.  Subjective:   HPI:   HTN- pt's BP was normal in April but elevated in May.  Better today but still above goal.  'I can't seem to bring it down below 140'.  BP was 159/75 when she got up this AM.  No known family hx of HTN.  Denies swelling of hands/feet, CP, SOB, HAs, visual changes.  Does report that legs and hands will swell w/ summer heat  ROS:   See pertinent positives and negatives per HPI.  Patient Active Problem List   Diagnosis Date Noted  . Shingles 04/25/2017  . Allergic rhinitis 04/10/2017  . Physical exam 02/14/2017  . Hyperlipidemia 08/14/2016  . Anxiety 08/14/2016  . Nonallopathic lesion of cervical region 06/06/2016  . Nonallopathic lesion of thoracic region 06/06/2016  . Nonallopathic lesion of lumbosacral region 06/06/2016  . Nonallopathic lesion of sacral region 06/06/2016  . Polyarthralgia 05/09/2016  . Strain of left piriformis muscle 04/09/2016  . Heart palpitations 08/31/2015  . Heart murmur 08/31/2015  . Greater trochanteric bursitis of both hips 04/05/2015  . Postmenopausal hormone replacement therapy 01/16/2015  . Hypothyroid 01/16/2015  . Vitamin D deficiency 01/16/2015    Social History   Tobacco Use  . Smoking status: Former Smoker    Packs/day: 0.50    Years: 25.00    Pack years: 12.50    Quit date: 02/25/2005    Years since quitting: 13.7  . Smokeless tobacco: Never Used  Substance Use Topics  . Alcohol use: Yes     Alcohol/week: 5.0 standard drinks    Types: 5 Glasses of wine per week    Current Outpatient Medications:  .  albuterol (PROVENTIL HFA;VENTOLIN HFA) 108 (90 Base) MCG/ACT inhaler, Inhale 2 puffs into the lungs every 6 (six) hours as needed for wheezing or shortness of breath., Disp: 1 Inhaler, Rfl: 2 .  ALPRAZolam (XANAX) 0.5 MG tablet, Take 1 tablet (0.5 mg total) by mouth 3 (three) times daily as needed for anxiety., Disp: 30 tablet, Rfl: 1 .  azelastine (ASTELIN) 0.1 % nasal spray, Place 2 sprays into both nostrils 2 (two) times daily. Use in each nostril as directed, Disp: 30 mL, Rfl: 12 .  Biotin 1 MG CAPS, Take by mouth., Disp: , Rfl:  .  calcium carbonate (TUMS - DOSED IN MG ELEMENTAL CALCIUM) 500 MG chewable tablet, Chew 1 tablet by mouth 3 (three) times daily as needed for indigestion or heartburn., Disp: , Rfl:  .  cetirizine (ZYRTEC) 10 MG tablet, Take 1 tablet (10 mg total) by mouth daily., Disp: 30 tablet, Rfl: 11 .  cholecalciferol (VITAMIN D) 1000 units tablet, Take 1,000 Units by mouth daily. , Disp: , Rfl:  .  Cyanocobalamin (VITAMIN B-12 PO), Take 1,000 mg by mouth daily. , Disp: , Rfl:  .  estradiol (ESTRACE) 0.5 MG tablet, Take 0.5 tablets (0.25 mg total) by mouth daily., Disp: 45 tablet, Rfl: 0 .  fluticasone (FLONASE) 50 MCG/ACT nasal spray, Place 2 sprays into both nostrils daily., Disp: 48 g, Rfl: 1 .  levothyroxine (SYNTHROID) 50 MCG tablet, TAKE 1 TABLET BY MOUTH  DAILY BEFORE BREAKFAST, Disp: 90 tablet, Rfl: 1 .  meclizine (ANTIVERT) 25 MG tablet, Take 1 tablet (25 mg total) by mouth 3 (three) times daily as needed for dizziness., Disp: 30 tablet, Rfl: 0 .  medroxyPROGESTERone (PROVERA) 2.5 MG tablet, Take 1 tablet (2.5 mg total) by mouth daily., Disp: 90 tablet, Rfl: 0 .  meloxicam (MOBIC) 15 MG tablet, TAKE 1 TABLET BY MOUTH EVERY DAY, Disp: 30 tablet, Rfl: 1 .  rosuvastatin (CRESTOR) 5 MG tablet, TAKE 1 TABLET BY MOUTH  DAILY, Disp: 90 tablet, Rfl: 1 .   sertraline (ZOLOFT) 25 MG tablet, TAKE 1 TABLET BY MOUTH  DAILY, Disp: 90 tablet, Rfl: 1 .  sodium chloride (MURO 128) 5 % ophthalmic ointment, Apply to eye., Disp: , Rfl:   Allergies  Allergen Reactions  . Lisinopril Cough  . Oxycodone-Acetaminophen Nausea Only    Objective:   BP 140/73   Temp 97.6 F (36.4 C) (Tympanic)   Ht 5\' 2"  (1.575 m)   Wt 132 lb (59.9 kg)   LMP 03/10/2010   BMI 24.14 kg/m   AAOx3, NAD NCAT, EOMI No obvious CN deficits Coloring WNL Pt is able to speak clearly, coherently without shortness of breath or increased work of breathing.  Thought process is linear.  Mood is appropriate.   Assessment and Plan:   HTN- pt has hx of this but was then well controlled off medication.  BP has recently again elevated.  Will start low dose HCTZ and monitor closely for improvement.  Pt expressed understanding and is in agreement w/ plan.    Neena RhymesKatherine Ovella [place], MD 11/19/2018

## 2018-11-24 ENCOUNTER — Telehealth: Payer: Self-pay | Admitting: Family Medicine

## 2018-11-24 NOTE — Telephone Encounter (Signed)
LM asking pt to call back to scheduled a 3-4 wks f/up on BP, Tabori would like to do this VV.

## 2018-12-07 ENCOUNTER — Other Ambulatory Visit: Payer: Self-pay | Admitting: Obstetrics and Gynecology

## 2018-12-15 ENCOUNTER — Ambulatory Visit (INDEPENDENT_AMBULATORY_CARE_PROVIDER_SITE_OTHER): Payer: 59 | Admitting: Family Medicine

## 2018-12-15 ENCOUNTER — Encounter: Payer: Self-pay | Admitting: Family Medicine

## 2018-12-15 ENCOUNTER — Other Ambulatory Visit: Payer: Self-pay

## 2018-12-15 VITALS — BP 123/65 | Temp 97.9°F | Ht 62.0 in | Wt 135.0 lb

## 2018-12-15 DIAGNOSIS — I10 Essential (primary) hypertension: Secondary | ICD-10-CM | POA: Diagnosis not present

## 2018-12-15 NOTE — Progress Notes (Signed)
I have discussed the procedure for the virtual visit with the patient who has given consent to proceed with assessment and treatment.   Sonia Kim, CMA     

## 2018-12-15 NOTE — Progress Notes (Signed)
Virtual Visit via Video   I connected with patient on 12/15/18 at  9:00 AM EDT by a video enabled telemedicine application and verified that I am speaking with the correct person using two identifiers.  Location patient: Home [facility] Persons participating in the virtual visit: Patient, Provider, CMA (Jess B)  I discussed the limitations of evaluation and management by telemedicine and the availability of in person appointments. The patient expressed understanding and agreed to proceed.  Subjective:   HPI:   HTN- pt was started on HCTZ at last visit and BP is much better today.  Pt reports feeling good.  Walking regularly.  No CP, SOB, HAs, visual changes, edema.  ROS:   See pertinent positives and negatives per HPI.  Patient Active Problem List   Diagnosis Date Noted  . HTN (hypertension) 11/19/2018  . Shingles 04/25/2017  . Allergic rhinitis 04/10/2017  . Physical exam 02/14/2017  . Hyperlipidemia 08/14/2016  . Anxiety 08/14/2016  . Nonallopathic lesion of cervical region 06/06/2016  . Nonallopathic lesion of thoracic region 06/06/2016  . Nonallopathic lesion of lumbosacral region 06/06/2016  . Nonallopathic lesion of sacral region 06/06/2016  . Polyarthralgia 05/09/2016  . Strain of left piriformis muscle 04/09/2016  . Heart palpitations 08/31/2015  . Heart murmur 08/31/2015  . Greater trochanteric bursitis of both hips 04/05/2015  . Postmenopausal hormone replacement therapy 01/16/2015  . Hypothyroid 01/16/2015  . Vitamin D deficiency 01/16/2015    Social History   Tobacco Use  . Smoking status: Former Smoker    Packs/day: 0.50    Years: 25.00    Pack years: 12.50    Quit date: 02/25/2005    Years since quitting: 13.8  . Smokeless tobacco: Never Used  Substance Use Topics  . Alcohol use: Yes    Alcohol/week: 5.0 standard drinks    Types: 5 Glasses of wine per week    Current Outpatient Medications:  .  albuterol  (PROVENTIL HFA;VENTOLIN HFA) 108 (90 Base) MCG/ACT inhaler, Inhale 2 puffs into the lungs every 6 (six) hours as needed for wheezing or shortness of breath., Disp: 1 Inhaler, Rfl: 2 .  ALPRAZolam (XANAX) 0.5 MG tablet, Take 1 tablet (0.5 mg total) by mouth 3 (three) times daily as needed for anxiety., Disp: 30 tablet, Rfl: 1 .  azelastine (ASTELIN) 0.1 % nasal spray, Place 2 sprays into both nostrils 2 (two) times daily. Use in each nostril as directed, Disp: 30 mL, Rfl: 12 .  Biotin 1 MG CAPS, Take by mouth., Disp: , Rfl:  .  calcium carbonate (TUMS - DOSED IN MG ELEMENTAL CALCIUM) 500 MG chewable tablet, Chew 1 tablet by mouth 3 (three) times daily as needed for indigestion or heartburn., Disp: , Rfl:  .  cetirizine (ZYRTEC) 10 MG tablet, Take 1 tablet (10 mg total) by mouth daily., Disp: 30 tablet, Rfl: 11 .  cholecalciferol (VITAMIN D) 1000 units tablet, Take 1,000 Units by mouth daily. , Disp: , Rfl:  .  Cyanocobalamin (VITAMIN B-12 PO), Take 1,000 mg by mouth daily. , Disp: , Rfl:  .  desloratadine (CLARINEX) 5 MG tablet, Take 5 mg by mouth daily., Disp: , Rfl:  .  estradiol (ESTRACE) 0.5 MG tablet, Take 0.5 tablets (0.25 mg total) by mouth daily., Disp: 45 tablet, Rfl: 0 .  fluticasone (FLONASE) 50 MCG/ACT nasal spray, Place 2 sprays into both nostrils daily., Disp: 48 g, Rfl: 1 .  hydrochlorothiazide (HYDRODIURIL) 12.5 MG tablet, Take 1 tablet (12.5 mg total) by  mouth daily., Disp: 90 tablet, Rfl: 3 .  levothyroxine (SYNTHROID) 50 MCG tablet, TAKE 1 TABLET BY MOUTH  DAILY BEFORE BREAKFAST, Disp: 90 tablet, Rfl: 1 .  meclizine (ANTIVERT) 25 MG tablet, Take 1 tablet (25 mg total) by mouth 3 (three) times daily as needed for dizziness., Disp: 30 tablet, Rfl: 0 .  medroxyPROGESTERone (PROVERA) 2.5 MG tablet, Take 1 tablet (2.5 mg total) by mouth daily., Disp: 90 tablet, Rfl: 0 .  meloxicam (MOBIC) 15 MG tablet, TAKE 1 TABLET BY MOUTH EVERY DAY, Disp: 30 tablet, Rfl: 1 .  rosuvastatin (CRESTOR) 5  MG tablet, TAKE 1 TABLET BY MOUTH  DAILY, Disp: 90 tablet, Rfl: 1 .  sertraline (ZOLOFT) 25 MG tablet, TAKE 1 TABLET BY MOUTH  DAILY, Disp: 90 tablet, Rfl: 1 .  sodium chloride (MURO 128) 5 % ophthalmic ointment, Apply to eye., Disp: , Rfl:   Allergies  Allergen Reactions  . Lisinopril Cough  . Oxycodone-Acetaminophen Nausea Only    Objective:   BP 123/65   Temp 97.9 F (36.6 C) (Oral)   Ht 5\' 2"  (1.575 m)   Wt 135 lb (61.2 kg)   LMP 03/10/2010   BMI 24.69 kg/m   AAOx3, NAD NCAT, EOMI No obvious CN deficits Coloring WNL Pt is able to speak clearly, coherently without shortness of breath or increased work of breathing.  Thought process is linear.  Mood is appropriate.   Assessment and Plan:   HTN- new at last visit.  BP much improved today.  Currently asymptomatic.  Check BMP to assess Cr and K+.  No anticipated med changes.  Will follow.   Annye Asa, MD 12/15/2018

## 2018-12-16 ENCOUNTER — Ambulatory Visit: Payer: 59

## 2018-12-18 ENCOUNTER — Other Ambulatory Visit: Payer: Self-pay | Admitting: Certified Nurse Midwife

## 2018-12-18 MED ORDER — ESTRADIOL 0.5 MG PO TABS: ORAL_TABLET | ORAL | 0 refills | Status: DC

## 2018-12-18 NOTE — Telephone Encounter (Signed)
Patient requesting a refill on estradiol. [facility] 800 [id number]

## 2018-12-18 NOTE — Telephone Encounter (Signed)
Medication refill request: Estradiol  Last AEX:  04/08/18 Next AEX: 04/14/19 Last MMG (if hormonal medication request): 03/23/18 Bi-rads 1 Neg  Refill authorized: #45 with 0 RF

## 2018-12-22 ENCOUNTER — Telehealth: Payer: Self-pay | Admitting: Family Medicine

## 2018-12-22 NOTE — Telephone Encounter (Signed)
LM asking pt to call back to schedule cpe with Tabori after 03/26/2019 per KT

## 2018-12-23 ENCOUNTER — Other Ambulatory Visit: Payer: Self-pay | Admitting: Obstetrics and Gynecology

## 2018-12-24 NOTE — Telephone Encounter (Signed)
Medication refill request: Provera Last AEX: 04/08/18  Next AEX: 04/14/19 Last MMG (if hormonal medication request): 03/23/18 Bi-rads 1 Neg  Refill authorized: #90 with 0 RF

## 2019-01-06 ENCOUNTER — Ambulatory Visit (INDEPENDENT_AMBULATORY_CARE_PROVIDER_SITE_OTHER): Payer: 59 | Admitting: Certified Nurse Midwife

## 2019-01-06 ENCOUNTER — Telehealth: Payer: Self-pay | Admitting: Certified Nurse Midwife

## 2019-01-06 ENCOUNTER — Telehealth: Payer: Self-pay | Admitting: *Deleted

## 2019-01-06 ENCOUNTER — Other Ambulatory Visit: Payer: Self-pay

## 2019-01-06 ENCOUNTER — Encounter: Payer: Self-pay | Admitting: Certified Nurse Midwife

## 2019-01-06 VITALS — BP 110/70 | HR 64 | Temp 97.3°F | Resp 16 | Wt 135.0 lb

## 2019-01-06 DIAGNOSIS — N6310 Unspecified lump in the right breast, unspecified quadrant: Secondary | ICD-10-CM

## 2019-01-06 DIAGNOSIS — N644 Mastodynia: Secondary | ICD-10-CM

## 2019-01-06 NOTE — Telephone Encounter (Signed)
-----   Message from Sonia Kim, CNM sent at 01/06/2019  3:38 PM EDT ----- Patient here for breast tenderness, mass noted alsoShe is aware she will be called with appointment information at breast center for diagnostic mammogram and [place]. Please schedule

## 2019-01-06 NOTE — Telephone Encounter (Signed)
Spoke with patient. Patient reports right breast pain and burning sensation for the past couple of days. States she has always had a tender area in the breast towards her sternum. Denies any skin changes, lumps, itching, nipple d/c, fever/chills. Last MMG 03/2018 negative.  [id number] prescreen negative, precautions reviewed. OV scheduled for today at 3pm with Melvia Heaps, CNM.   Patient verbalizes understanding and is agreeable.   Encounter closed.

## 2019-01-06 NOTE — Progress Notes (Signed)
   Subjective:   63 y.o. Married Caucasian female presents for evaluation of right breast mass. Onset of the symptoms was 4 days ago. Patient sought evaluation because of breast tenderness inside of breast near center. Contributing factors include none. Denies chills, fevers and tight bra. Patient denies history of trauma, bites, or injuries. Last mammogram was 03/23/18 which was normal with Birads 1 density B.  Review of Systems Pertinent items noted in HPI and remainder of comprehensive ROS otherwise negative.@SUBJECTIVE    Objective:   General appearance: alert, cooperative, appears stated age and no distress  Physical Exam Chest:     Breasts:        Right: Mass and tenderness present. No swelling, bleeding, inverted nipple, nipple discharge or skin change.        Left: No swelling, bleeding, inverted nipple, mass, nipple discharge, skin change or tenderness.       Assessment:   ASSESSMENT:Patient is diagnosed with right breast tenderness and small breast mass noted as above at 4 o'clock. Post menopausal on ERT.   Plan:   PLAN: Discussed finding with patient and she had felt this area and agrees this is tender. Discussed possible cyst or fibroglandular tissue. Discussed diagnostic mammogram evaluation with [place] recommended. Patient agreeable. She will be called with appointment information. Advised to call if symptoms increase or change. Questions addressed.  Rv prn

## 2019-01-06 NOTE — Telephone Encounter (Signed)
Spoke with patient, advised of appt at [facility]. Patient verbalizes understanding and is agreeable to date and time.   Routing to provider for final review. Patient is agreeable to disposition. Will close encounter.

## 2019-01-06 NOTE — Telephone Encounter (Signed)
Patient says there is a burning sensation on her right breast.

## 2019-01-06 NOTE — Telephone Encounter (Signed)
Spoke with Lilia Pro at [facility]. Right breast Dx MMG and [place], if needed, scheduled for 8/4 at 9:50am, arrive at 9:30am.

## 2019-01-12 ENCOUNTER — Ambulatory Visit
Admission: RE | Admit: 2019-01-12 | Discharge: 2019-01-12 | Disposition: A | Discharge status: Home / Self Care | Payer: 59 | Source: Ambulatory Visit | Attending: Certified Nurse Midwife | Admitting: Certified Nurse Midwife

## 2019-01-12 ENCOUNTER — Other Ambulatory Visit: Payer: Self-pay

## 2019-01-12 DIAGNOSIS — N6310 Unspecified lump in the right breast, unspecified quadrant: Secondary | ICD-10-CM

## 2019-01-12 DIAGNOSIS — N644 Mastodynia: Secondary | ICD-10-CM

## 2019-01-22 ENCOUNTER — Ambulatory Visit: Payer: 59 | Admitting: Physician Assistant

## 2019-02-17 ENCOUNTER — Other Ambulatory Visit: Payer: Self-pay | Admitting: Certified Nurse Midwife

## 2019-02-17 LAB — HM COLONOSCOPY

## 2019-02-18 NOTE — Telephone Encounter (Signed)
Medication refill request: Provera Last AEX:  04/08/18 DL Next AEX: 04/14/19 Last MMG (if hormonal medication request): 01/12/19 Right Breast Diagnostic MM/US - BIRADS 1 negative/density c Refill authorized: Please advise; order pended #90 w/0 refills if authorized

## 2019-02-19 ENCOUNTER — Encounter: Payer: Self-pay | Admitting: Emergency Medicine

## 2019-03-23 ENCOUNTER — Other Ambulatory Visit: Payer: Self-pay

## 2019-03-23 ENCOUNTER — Ambulatory Visit (INDEPENDENT_AMBULATORY_CARE_PROVIDER_SITE_OTHER): Payer: 59 | Admitting: Family Medicine

## 2019-03-23 ENCOUNTER — Encounter: Payer: Self-pay | Admitting: Family Medicine

## 2019-03-23 VITALS — BP 117/63 | HR 82 | Temp 97.8°F | Ht 62.0 in | Wt 130.0 lb

## 2019-03-23 DIAGNOSIS — R058 Other specified cough: Secondary | ICD-10-CM

## 2019-03-23 DIAGNOSIS — R05 Cough: Secondary | ICD-10-CM | POA: Diagnosis not present

## 2019-03-23 MED ORDER — PREDNISONE 10 MG PO TABS: ORAL_TABLET | ORAL | 0 refills | Status: DC

## 2019-03-23 NOTE — Progress Notes (Signed)
I have discussed the procedure for the virtual visit with the patient who has given consent to proceed with assessment and treatment.   Osias Resnick L Amariyon Maynes, CMA     

## 2019-03-23 NOTE — Progress Notes (Signed)
Virtual Visit via Video   I connected with patient on 03/23/19 at 11:30 AM EDT by a video enabled telemedicine application and verified that I am speaking with the correct person using two identifiers.  Location patient: Home Location provider: Acupuncturist, Office Persons participating in the virtual visit: Patient, Provider, [place] (Jess B)  I discussed the limitations of evaluation and management by telemedicine and the availability of in person appointments. The patient expressed understanding and agreed to proceed.  Subjective:   HPI:   Cough- 'I feel fine'.  Pt reports ~10 days ago they had to replace pump to the well and they had to douse the system w/ bleach.  Cough is worse at night, 'feels heavy on my chest'.  Feels pretty good during the day.  Will intermittently have a cough productive of thick yellow sputum.  Some SOB w/ exertion during the day.  No fevers.  No body aches.  No changes to taste or smell.  Mild nasal congestion.  'it's a different cough than my allergy cough'.  Albuterol seems to trigger cough but then it calms.    ROS:   See pertinent positives and negatives per HPI.  Patient Active Problem List   Diagnosis Date Noted  . HTN (hypertension) 11/19/2018  . Shingles 04/25/2017  . Allergic rhinitis 04/10/2017  . Physical exam 02/14/2017  . Hyperlipidemia 08/14/2016  . Anxiety 08/14/2016  . Nonallopathic lesion of cervical region 06/06/2016  . Nonallopathic lesion of thoracic region 06/06/2016  . Nonallopathic lesion of lumbosacral region 06/06/2016  . Nonallopathic lesion of sacral region 06/06/2016  . Polyarthralgia 05/09/2016  . Strain of left piriformis muscle 04/09/2016  . Heart palpitations 08/31/2015  . Heart murmur 08/31/2015  . Greater trochanteric bursitis of both hips 04/05/2015  . Postmenopausal hormone replacement therapy 01/16/2015  . Hypothyroid 01/16/2015  . Vitamin D deficiency 01/16/2015    Social History   Tobacco Use  .  Smoking status: Former Smoker    Packs/day: 0.50    Years: 25.00    Pack years: 12.50    Quit date: 02/25/2005    Years since quitting: 14.0  . Smokeless tobacco: Never Used  Substance Use Topics  . Alcohol use: Yes    Alcohol/week: 5.0 standard drinks    Types: 5 Glasses of wine per week    Current Outpatient Medications:  .  albuterol (PROVENTIL HFA;VENTOLIN HFA) 108 (90 Base) MCG/ACT inhaler, Inhale 2 puffs into the lungs every 6 (six) hours as needed for wheezing or shortness of breath., Disp: 1 Inhaler, Rfl: 2 .  ALPRAZolam (XANAX) 0.5 MG tablet, Take 1 tablet (0.5 mg total) by mouth 3 (three) times daily as needed for anxiety., Disp: 30 tablet, Rfl: 1 .  azelastine (ASTELIN) 0.1 % nasal spray, Place 2 sprays into both nostrils 2 (two) times daily. Use in each nostril as directed, Disp: 30 mL, Rfl: 12 .  Biotin 1 MG CAPS, Take by mouth., Disp: , Rfl:  .  calcium carbonate (TUMS - DOSED IN MG ELEMENTAL CALCIUM) 500 MG chewable tablet, Chew 1 tablet by mouth 3 (three) times daily as needed for indigestion or heartburn., Disp: , Rfl:  .  cetirizine (ZYRTEC) 10 MG tablet, Take 1 tablet (10 mg total) by mouth daily., Disp: 30 tablet, Rfl: 11 .  desloratadine (CLARINEX) 5 MG tablet, Take 5 mg by mouth daily., Disp: , Rfl:  .  estradiol (ESTRACE) 0.5 MG tablet, Take one half tablet daily by mounth, Disp: 45 tablet, Rfl: 0 .  fluticasone (FLONASE) 50 MCG/ACT nasal spray, Place 2 sprays into both nostrils daily., Disp: 48 g, Rfl: 1 .  GAVILYTE-G 236 g solution, See admin instructions., Disp: , Rfl:  .  hydrochlorothiazide (HYDRODIURIL) 12.5 MG tablet, Take 1 tablet (12.5 mg total) by mouth daily., Disp: 90 tablet, Rfl: 3 .  levothyroxine (SYNTHROID) 50 MCG tablet, TAKE 1 TABLET BY MOUTH  DAILY BEFORE BREAKFAST, Disp: 90 tablet, Rfl: 1 .  meclizine (ANTIVERT) 25 MG tablet, Take 1 tablet (25 mg total) by mouth 3 (three) times daily as needed for dizziness., Disp: 30 tablet, Rfl: 0 .   medroxyPROGESTERone (PROVERA) 2.5 MG tablet, TAKE 1 TABLET BY MOUTH  DAILY, Disp: 90 tablet, Rfl: 0 .  meloxicam (MOBIC) 15 MG tablet, TAKE 1 TABLET BY MOUTH EVERY DAY, Disp: 30 tablet, Rfl: 1 .  rosuvastatin (CRESTOR) 5 MG tablet, TAKE 1 TABLET BY MOUTH  DAILY, Disp: 90 tablet, Rfl: 1 .  sertraline (ZOLOFT) 25 MG tablet, TAKE 1 TABLET BY MOUTH  DAILY, Disp: 90 tablet, Rfl: 1  Allergies  Allergen Reactions  . Lisinopril Cough  . Oxycodone-Acetaminophen Nausea Only    Objective:   BP 117/63   Pulse 82   Temp 97.8 F (36.6 C) (Tympanic)   Ht 5\' 2"  (1.575 m)   Wt 130 lb (59 kg)   LMP 03/10/2010   SpO2 98%   BMI 23.78 kg/m   AAOx3, NAD NCAT, EOMI No obvious CN deficits Coloring WNL Pt is able to speak clearly, coherently without shortness of breath or increased work of breathing. + wet cough. Thought process is linear.  Mood is appropriate.   Assessment and Plan:   Spasmodic cough- new.  Pt's sxs are consistent w/ possible bronchospasm from inhaled irritation (bleach/seasonal allergies).  Notes some wheezing at night, chest tightness, mild SOB w/ exertion but otherwise feeling 'fine'.  Is well appearing.  Will continue albuterol.  Add Prednisone taper.  Discussed COVID and need for testing if sxs worsen or fail to improve.  Pt expressed understanding and is in agreement w/ plan.    05/10/2010, MD 03/23/2019

## 2019-03-24 ENCOUNTER — Other Ambulatory Visit: Payer: Self-pay | Admitting: Certified Nurse Midwife

## 2019-03-24 DIAGNOSIS — Z1231 Encounter for screening mammogram for malignant neoplasm of breast: Secondary | ICD-10-CM

## 2019-04-01 ENCOUNTER — Encounter: Payer: Self-pay | Admitting: Family Medicine

## 2019-04-01 ENCOUNTER — Other Ambulatory Visit: Payer: Self-pay

## 2019-04-01 ENCOUNTER — Ambulatory Visit (INDEPENDENT_AMBULATORY_CARE_PROVIDER_SITE_OTHER): Payer: 59 | Admitting: Family Medicine

## 2019-04-01 VITALS — BP 128/70 | HR 75 | Temp 97.8°F | Resp 16 | Ht 62.0 in | Wt 134.4 lb

## 2019-04-01 DIAGNOSIS — Z0000 Encounter for general adult medical examination without abnormal findings: Secondary | ICD-10-CM

## 2019-04-01 DIAGNOSIS — E559 Vitamin D deficiency, unspecified: Secondary | ICD-10-CM

## 2019-04-01 DIAGNOSIS — I10 Essential (primary) hypertension: Secondary | ICD-10-CM | POA: Diagnosis not present

## 2019-04-01 LAB — CBC WITH DIFFERENTIAL/PLATELET
Basophils Absolute: 0.1 10*3/uL (ref 0.0–0.1)
Basophils Relative: 0.9 % (ref 0.0–3.0)
Eosinophils Absolute: 0.4 10*3/uL (ref 0.0–0.7)
Eosinophils Relative: 4.1 % (ref 0.0–5.0)
HCT: 44.4 % (ref 36.0–46.0)
Hemoglobin: 14.9 g/dL (ref 12.0–15.0)
Lymphocytes Relative: 27.8 % (ref 12.0–46.0)
Lymphs Abs: 2.8 10*3/uL (ref 0.7–4.0)
MCHC: 33.5 g/dL (ref 30.0–36.0)
MCV: 95.8 fl (ref 78.0–100.0)
Monocytes Absolute: 0.8 10*3/uL (ref 0.1–1.0)
Monocytes Relative: 8.3 % (ref 3.0–12.0)
Neutro Abs: 5.8 10*3/uL (ref 1.4–7.7)
Neutrophils Relative %: 58.9 % (ref 43.0–77.0)
Platelets: 227 10*3/uL (ref 150.0–400.0)
RBC: 4.64 Mil/uL (ref 3.87–5.11)
RDW: 12.9 % (ref 11.5–15.5)
WBC: 9.9 10*3/uL (ref 4.0–10.5)

## 2019-04-01 LAB — HEPATIC FUNCTION PANEL
ALT: 9 U/L (ref 0–35)
AST: 18 U/L (ref 0–37)
Albumin: 4.3 g/dL (ref 3.5–5.2)
Alkaline Phosphatase: 60 U/L (ref 39–117)
Bilirubin, Direct: 0.1 mg/dL (ref 0.0–0.3)
Total Bilirubin: 0.8 mg/dL (ref 0.2–1.2)
Total Protein: 6.1 g/dL (ref 6.0–8.3)

## 2019-04-01 LAB — BASIC METABOLIC PANEL
BUN: 16 mg/dL (ref 6–23)
CO2: 32 mEq/L (ref 19–32)
Calcium: 9.3 mg/dL (ref 8.4–10.5)
Chloride: 102 mEq/L (ref 96–112)
Creatinine, Ser: 0.83 mg/dL (ref 0.40–1.20)
GFR: 69.5 mL/min (ref >60.00)
Glucose, Bld: 92 mg/dL (ref 70–99)
Potassium: 4.5 mEq/L (ref 3.5–5.1)
Sodium: 140 mEq/L (ref 135–145)

## 2019-04-01 LAB — LIPID PANEL
Cholesterol: 183 mg/dL (ref 0–200)
HDL: 48.1 mg/dL (ref >39.00)
LDL Cholesterol: 104 mg/dL — ABNORMAL HIGH (ref 0–99)
NonHDL: 134.69
Total CHOL/HDL Ratio: 4
Triglycerides: 151 mg/dL — ABNORMAL HIGH (ref 0.0–149.0)
VLDL: 30.2 mg/dL (ref 0.0–40.0)

## 2019-04-01 LAB — TSH: TSH: 4.99 u[IU]/mL — ABNORMAL HIGH (ref 0.35–4.50)

## 2019-04-01 LAB — VITAMIN D 25 HYDROXY (VIT D DEFICIENCY, FRACTURES): VITD: 29.29 ng/mL — ABNORMAL LOW (ref 30.00–100.00)

## 2019-04-01 NOTE — Progress Notes (Signed)
   Subjective:    Patient ID: Sonia Kim, female    DOB: 03-28-1956, 63 y.o.   MRN: [id number]  HPI CPE- UTD on flu, Tdap, colonoscopy.  UTD on pap.  Has mammo scheduled.  No concerns today.   Review of Systems Patient reports no vision/ hearing changes, adenopathy,fever, weight change,  persistant/recurrent hoarseness , swallowing issues, chest pain, palpitations, edema, persistant/recurrent cough, hemoptysis, dyspnea (rest/exertional/paroxysmal nocturnal), gastrointestinal bleeding (melena, rectal bleeding), abdominal pain, significant heartburn, bowel changes, GU symptoms (dysuria, hematuria, incontinence), Gyn symptoms (abnormal  bleeding, pain),  syncope, focal weakness, memory loss, numbness & tingling, skin/hair/nail changes, abnormal bruising or bleeding, anxiety, or depression.     Objective:   Physical Exam General Appearance:    Alert, cooperative, no distress, appears stated age  Head:    Normocephalic, without obvious abnormality, atraumatic  Eyes:    PERRL, conjunctiva/corneas clear, EOM's intact, fundi    benign, both eyes  Ears:    Normal TM's and external ear canals, both ears  Nose:   Deferred due to COVID  Throat:   Neck:   Supple, symmetrical, trachea midline, no adenopathy;    Thyroid: no enlargement/tenderness/nodules  Back:     Symmetric, no curvature, ROM normal, no CVA tenderness  Lungs:     Clear to auscultation bilaterally, respirations unlabored  Chest Wall:    No tenderness or deformity   Heart:    Regular rate and rhythm, S1 and S2 normal, no murmur, rub   or gallop  Breast Exam:    Deferred to GYN  Abdomen:     Soft, non-tender, bowel sounds active all four quadrants,    no masses, no organomegaly  Genitalia:    Deferred to GYN  Rectal:    Extremities:   Extremities normal, atraumatic, no cyanosis or edema  Pulses:   2+ and symmetric all extremities  Skin:   Skin color, texture, turgor normal, no rashes or lesions  Lymph nodes:   Cervical,  supraclavicular, and axillary nodes normal  Neurologic:   CNII-XII intact, normal strength, sensation and reflexes    throughout          Assessment & Plan:

## 2019-04-01 NOTE — Assessment & Plan Note (Signed)
Chronic problem.  Well controlled.  Asymptomatic.  Check labs.  No anticipated med changes.  Will follow. 

## 2019-04-01 NOTE — Assessment & Plan Note (Signed)
Check labs and replete prn. 

## 2019-04-01 NOTE — Patient Instructions (Signed)
Follow up in 6 months to recheck BP and cholesterol We'll notify you of your lab results and make any changes if needed Keep up the good work!  You look great! Call with any questions or concerns Stay Safe!!  Stay Sane!!

## 2019-04-01 NOTE — Assessment & Plan Note (Signed)
Pt's PE WNL.  UTD on pap, colonoscopy, immunizations.  Has mammo scheduled.  Check labs.  Anticipatory guidance provided.

## 2019-04-02 ENCOUNTER — Other Ambulatory Visit (INDEPENDENT_AMBULATORY_CARE_PROVIDER_SITE_OTHER): Payer: 59

## 2019-04-02 DIAGNOSIS — R7989 Other specified abnormal findings of blood chemistry: Secondary | ICD-10-CM

## 2019-04-02 LAB — T3, FREE: T3, Free: 3 pg/mL (ref 2.3–4.2)

## 2019-04-02 LAB — T4, FREE: Free T4: 1.11 ng/dL (ref 0.60–1.60)

## 2019-04-05 ENCOUNTER — Encounter: Payer: Self-pay | Admitting: General Practice

## 2019-04-06 ENCOUNTER — Ambulatory Visit: Payer: 59

## 2019-04-07 ENCOUNTER — Other Ambulatory Visit: Payer: Self-pay

## 2019-04-07 ENCOUNTER — Telehealth: Payer: Self-pay | Admitting: Family Medicine

## 2019-04-07 ENCOUNTER — Ambulatory Visit (INDEPENDENT_AMBULATORY_CARE_PROVIDER_SITE_OTHER): Payer: 59 | Admitting: Family Medicine

## 2019-04-07 ENCOUNTER — Encounter: Payer: Self-pay | Admitting: Family Medicine

## 2019-04-07 DIAGNOSIS — R0602 Shortness of breath: Secondary | ICD-10-CM

## 2019-04-07 DIAGNOSIS — R05 Cough: Secondary | ICD-10-CM

## 2019-04-07 DIAGNOSIS — Z20822 Contact with and (suspected) exposure to covid-19: Secondary | ICD-10-CM

## 2019-04-07 DIAGNOSIS — R059 Cough, unspecified: Secondary | ICD-10-CM

## 2019-04-07 DIAGNOSIS — Z20828 Contact with and (suspected) exposure to other viral communicable diseases: Secondary | ICD-10-CM

## 2019-04-07 MED ORDER — QVAR REDIHALER 80 MCG/ACT IN AERB: 1.0000 | INHALATION_SPRAY | Freq: Two times a day (BID) | RESPIRATORY_TRACT | 6 refills | Status: DC

## 2019-04-07 NOTE — Telephone Encounter (Signed)
Spoke with pt. She is on her way to [facility] for COVID testing. SOB seems to happen mostly with exertion says it almost feels like it would if you have a panic attack. Pt was also scheduled for 2:30 with PCP [facility].

## 2019-04-07 NOTE — Telephone Encounter (Signed)
Pt called in stating that she finished the prednisone and has been feeling fine. Pt states that her cough has come back and she is has noticed her breathing is heavier when she is walking her dogs. She states that she is having chest tightness and chest congestion. She wanted to know what Dr. Birdie Riddle reccommended that she do about this. Pt can be reached at the home #

## 2019-04-07 NOTE — Progress Notes (Signed)
Virtual Visit via Video   I connected with patient on 04/07/19 at  2:30 PM EDT by a video enabled telemedicine application and verified that I am speaking with the correct person using two identifiers.  Location patient: Home Location provider: Astronomer, Office Persons participating in the virtual visit: Patient, Provider, CMA (Jess B)  I discussed the limitations of evaluation and management by telemedicine and the availability of in person appointments. The patient expressed understanding and agreed to proceed.  Subjective:   HPI:  Cough/SOB- 'I feel fine except for this darn cough and shortness of breath'.  Took her regular walks yesterday and 'I just couldn't take a deep breath, which is so unusual for me'.  Has had nocturnal symptoms- cough and SOB.  Cough is no longer 'wheezy'- it's a 'strong, dry, hacking cough'.  No fevers.  No body aches.  No change to taste or smell.  Pt worked in [employer] and was exposed to chemicals for years.  Some improvement in cough and SOB w/ inhaler.  COVID test pending.    ROS:   See pertinent positives and negatives per HPI.  Patient Active Problem List   Diagnosis Date Noted  . HTN (hypertension) 11/19/2018  . Shingles 04/25/2017  . Allergic rhinitis 04/10/2017  . Physical exam 02/14/2017  . Hyperlipidemia 08/14/2016  . Anxiety 08/14/2016  . Nonallopathic lesion of cervical region 06/06/2016  . Nonallopathic lesion of thoracic region 06/06/2016  . Nonallopathic lesion of lumbosacral region 06/06/2016  . Nonallopathic lesion of sacral region 06/06/2016  . Polyarthralgia 05/09/2016  . Strain of left piriformis muscle 04/09/2016  . Heart palpitations 08/31/2015  . Heart murmur 08/31/2015  . Greater trochanteric bursitis of both hips 04/05/2015  . Postmenopausal hormone replacement therapy 01/16/2015  . Hypothyroid 01/16/2015  . Vitamin D deficiency 01/16/2015    Social History   Tobacco Use  . Smoking  status: Former Smoker    Packs/day: 0.50    Years: 25.00    Pack years: 12.50    Quit date: 02/25/2005    Years since quitting: 14.1  . Smokeless tobacco: Never Used  Substance Use Topics  . Alcohol use: Yes    Alcohol/week: 5.0 standard drinks    Types: 5 Glasses of wine per week    Current Outpatient Medications:  .  albuterol (PROVENTIL HFA;VENTOLIN HFA) 108 (90 Base) MCG/ACT inhaler, Inhale 2 puffs into the lungs every 6 (six) hours as needed for wheezing or shortness of breath., Disp: 1 Inhaler, Rfl: 2 .  ALPRAZolam (XANAX) 0.5 MG tablet, Take 1 tablet (0.5 mg total) by mouth 3 (three) times daily as needed for anxiety., Disp: 30 tablet, Rfl: 1 .  azelastine (ASTELIN) 0.1 % nasal spray, Place 2 sprays into both nostrils 2 (two) times daily. Use in each nostril as directed, Disp: 30 mL, Rfl: 12 .  Biotin 1 MG CAPS, Take by mouth., Disp: , Rfl:  .  calcium carbonate (TUMS - DOSED IN MG ELEMENTAL CALCIUM) 500 MG chewable tablet, Chew 1 tablet by mouth 3 (three) times daily as needed for indigestion or heartburn., Disp: , Rfl:  .  cetirizine (ZYRTEC) 10 MG tablet, Take 1 tablet (10 mg total) by mouth daily., Disp: 30 tablet, Rfl: 11 .  desloratadine (CLARINEX) 5 MG tablet, Take 5 mg by mouth daily., Disp: , Rfl:  .  estradiol (ESTRACE) 0.5 MG tablet, Take one half tablet daily by mounth, Disp: 45 tablet, Rfl: 0 .  fluticasone (FLONASE) 50 MCG/ACT nasal spray,  Place 2 sprays into both nostrils daily., Disp: 48 g, Rfl: 1 .  GAVILYTE-G 236 g solution, See admin instructions., Disp: , Rfl:  .  hydrochlorothiazide (HYDRODIURIL) 12.5 MG tablet, Take 1 tablet (12.5 mg total) by mouth daily., Disp: 90 tablet, Rfl: 3 .  levothyroxine (SYNTHROID) 50 MCG tablet, TAKE 1 TABLET BY MOUTH  DAILY BEFORE BREAKFAST, Disp: 90 tablet, Rfl: 1 .  meclizine (ANTIVERT) 25 MG tablet, Take 1 tablet (25 mg total) by mouth 3 (three) times daily as needed for dizziness., Disp: 30 tablet, Rfl: 0 .  medroxyPROGESTERone  (PROVERA) 2.5 MG tablet, TAKE 1 TABLET BY MOUTH  DAILY, Disp: 90 tablet, Rfl: 0 .  meloxicam (MOBIC) 15 MG tablet, TAKE 1 TABLET BY MOUTH EVERY DAY, Disp: 30 tablet, Rfl: 1 .  rosuvastatin (CRESTOR) 5 MG tablet, TAKE 1 TABLET BY MOUTH  DAILY, Disp: 90 tablet, Rfl: 1 .  sertraline (ZOLOFT) 25 MG tablet, TAKE 1 TABLET BY MOUTH  DAILY, Disp: 90 tablet, Rfl: 1  Allergies  Allergen Reactions  . Lisinopril Cough  . Oxycodone-Acetaminophen Nausea Only    Objective:   LMP 03/10/2010  AAOx3, NAD NCAT, EOMI No obvious CN deficits Coloring WNL Pt is able to speak clearly, coherently without shortness of breath or increased work of breathing.  Thought process is linear.  Mood is appropriate.   Assessment and Plan:   SOB/cough- pt reports nocturnal SOB and cough for a few nights and then yesterday developed SOB on exertion.  She has a COVID test pending but reports feeling well and having no other sxs.  Suspect she has a form of RAD- possibly due to work exposures.  Will start daily inhaled ICS to improve sxs and if that helps, will refer to pulmonary for complete assessment.  Pt expressed understanding and is in agreement w/ plan.    Annye Asa, MD 04/07/2019

## 2019-04-07 NOTE — Telephone Encounter (Signed)
Based on the fact that her sxs had resolved and she was feeling fine.... until she wasn't... she needs to be tested for COVID.  And if she is having chest tightness, SOB, and congestion, she needs a virtual visit

## 2019-04-07 NOTE — Telephone Encounter (Signed)
Please advise 

## 2019-04-07 NOTE — Progress Notes (Signed)
I have discussed the procedure for the virtual visit with the patient who has given consent to proceed with assessment and treatment.   Ashvin Adelson L Eriel Dunckel, CMA     

## 2019-04-08 LAB — NOVEL CORONAVIRUS, NAA: SARS-CoV-2, NAA: NOT DETECTED

## 2019-04-14 ENCOUNTER — Ambulatory Visit: Payer: 59 | Admitting: Certified Nurse Midwife

## 2019-04-20 ENCOUNTER — Encounter: Payer: Self-pay | Admitting: Family Medicine

## 2019-04-25 ENCOUNTER — Other Ambulatory Visit: Payer: Self-pay | Admitting: Certified Nurse Midwife

## 2019-04-25 ENCOUNTER — Other Ambulatory Visit: Payer: Self-pay | Admitting: Family Medicine

## 2019-04-26 ENCOUNTER — Ambulatory Visit: Payer: 59 | Admitting: Certified Nurse Midwife

## 2019-04-26 NOTE — Telephone Encounter (Signed)
Medication refill request: Estradiol  Last AEX:  04-08-18 DL  Next AEX: 07-14-19 Last MMG (if hormonal medication request): 01-12-2019 density C/BIRADS 1 negative  Refill authorized: Today, please advise.   Medication pended for #45, 0RF. Please refill if appropriate.

## 2019-05-03 ENCOUNTER — Ambulatory Visit: Payer: 59

## 2019-05-09 ENCOUNTER — Other Ambulatory Visit: Payer: Self-pay | Admitting: Certified Nurse Midwife

## 2019-05-10 NOTE — Telephone Encounter (Signed)
Medication refill request: Provera  Last AEX:  04-08-18 DL  Next AEX: 07-14-19  Last MMG (if hormonal medication request): 01-12-2019 density C/BIRADS 1 negative Refill authorized: Today, please advise.   Medication pended for #90, 0RF. Please refill if appropriate.

## 2019-05-14 ENCOUNTER — Ambulatory Visit (INDEPENDENT_AMBULATORY_CARE_PROVIDER_SITE_OTHER): Payer: 59 | Admitting: Family Medicine

## 2019-05-14 ENCOUNTER — Encounter: Payer: Self-pay | Admitting: Family Medicine

## 2019-05-14 ENCOUNTER — Other Ambulatory Visit: Payer: Self-pay

## 2019-05-14 VITALS — BP 136/64 | HR 71 | Temp 98.0°F

## 2019-05-14 DIAGNOSIS — R059 Cough, unspecified: Secondary | ICD-10-CM

## 2019-05-14 DIAGNOSIS — R05 Cough: Secondary | ICD-10-CM | POA: Diagnosis not present

## 2019-05-14 MED ORDER — PREDNISONE 10 MG PO TABS: ORAL_TABLET | ORAL | 0 refills | Status: DC

## 2019-05-14 NOTE — Progress Notes (Signed)
I have discussed the procedure for the virtual visit with the patient who has given consent to proceed with assessment and treatment.   Chelle Cayton L Marnesha Gagen, CMA     

## 2019-05-14 NOTE — Progress Notes (Signed)
Virtual Visit via Video   I connected with patient on 05/14/19 at  3:30 PM EST by a video enabled telemedicine application and verified that I am speaking with the correct person using two identifiers.  Location patient: Home Location provider: Acupuncturist, Office Persons participating in the virtual visit: Patient, Provider, [place] (Jess B)  I discussed the limitations of evaluation and management by telemedicine and the availability of in person appointments. The patient expressed understanding and agreed to proceed.  Subjective:   HPI:   Cough- sxs started ~2 weeks ago w/ 'nasty cough', 'very phlegmy at night'.  Taking Mucinex w/o improvement.  'sometimes I cough so hard I'm going to be sick to my stomach'.  'very wheezy'.  No fevers.  Otherwise feeling fine.  No body aches.  No changes to taste or smell.  No known sick contacts.  Some relief w/ albuterol.  + nasal drainage and PND.  ROS:   See pertinent positives and negatives per HPI.  Patient Active Problem List   Diagnosis Date Noted  . HTN (hypertension) 11/19/2018  . Shingles 04/25/2017  . Allergic rhinitis 04/10/2017  . Physical exam 02/14/2017  . Hyperlipidemia 08/14/2016  . Anxiety 08/14/2016  . Nonallopathic lesion of cervical region 06/06/2016  . Nonallopathic lesion of thoracic region 06/06/2016  . Nonallopathic lesion of lumbosacral region 06/06/2016  . Nonallopathic lesion of sacral region 06/06/2016  . Polyarthralgia 05/09/2016  . Strain of left piriformis muscle 04/09/2016  . Heart palpitations 08/31/2015  . Heart murmur 08/31/2015  . Greater trochanteric bursitis of both hips 04/05/2015  . Postmenopausal hormone replacement therapy 01/16/2015  . Hypothyroid 01/16/2015  . Vitamin D deficiency 01/16/2015    Social History   Tobacco Use  . Smoking status: Former Smoker    Packs/day: 0.50    Years: 25.00    Pack years: 12.50    Quit date: 02/25/2005    Years since quitting: 14.2  . Smokeless  tobacco: Never Used  Substance Use Topics  . Alcohol use: Yes    Alcohol/week: 5.0 standard drinks    Types: 5 Glasses of wine per week    Current Outpatient Medications:  .  albuterol (PROVENTIL HFA;VENTOLIN HFA) 108 (90 Base) MCG/ACT inhaler, Inhale 2 puffs into the lungs every 6 (six) hours as needed for wheezing or shortness of breath., Disp: 1 Inhaler, Rfl: 2 .  ALPRAZolam (XANAX) 0.5 MG tablet, Take 1 tablet (0.5 mg total) by mouth 3 (three) times daily as needed for anxiety., Disp: 30 tablet, Rfl: 1 .  azelastine (ASTELIN) 0.1 % nasal spray, Place 2 sprays into both nostrils 2 (two) times daily. Use in each nostril as directed, Disp: 30 mL, Rfl: 12 .  Biotin 1 MG CAPS, Take by mouth., Disp: , Rfl:  .  calcium carbonate (TUMS - DOSED IN MG ELEMENTAL CALCIUM) 500 MG chewable tablet, Chew 1 tablet by mouth 3 (three) times daily as needed for indigestion or heartburn., Disp: , Rfl:  .  cetirizine (ZYRTEC) 10 MG tablet, Take 1 tablet (10 mg total) by mouth daily., Disp: 30 tablet, Rfl: 11 .  desloratadine (CLARINEX) 5 MG tablet, Take 5 mg by mouth daily., Disp: , Rfl:  .  estradiol (ESTRACE) 0.5 MG tablet, TAKE ONE-HALF TABLET BY  MOUTH DAILY, Disp: 45 tablet, Rfl: 0 .  fluticasone (FLONASE) 50 MCG/ACT nasal spray, Place 2 sprays into both nostrils daily., Disp: 48 g, Rfl: 1 .  GAVILYTE-G 236 g solution, See admin instructions., Disp: , Rfl:  .  hydrochlorothiazide (HYDRODIURIL) 12.5 MG tablet, Take 1 tablet (12.5 mg total) by mouth daily., Disp: 90 tablet, Rfl: 3 .  levothyroxine (SYNTHROID) 50 MCG tablet, TAKE 1 TABLET BY MOUTH  DAILY BEFORE BREAKFAST, Disp: 90 tablet, Rfl: 1 .  meclizine (ANTIVERT) 25 MG tablet, Take 1 tablet (25 mg total) by mouth 3 (three) times daily as needed for dizziness., Disp: 30 tablet, Rfl: 0 .  medroxyPROGESTERone (PROVERA) 2.5 MG tablet, TAKE 1 TABLET BY MOUTH  DAILY, Disp: 90 tablet, Rfl: 0 .  meloxicam (MOBIC) 15 MG tablet, TAKE 1 TABLET BY MOUTH EVERY DAY,  Disp: 30 tablet, Rfl: 1 .  rosuvastatin (CRESTOR) 5 MG tablet, TAKE 1 TABLET BY MOUTH  DAILY, Disp: 90 tablet, Rfl: 1 .  sertraline (ZOLOFT) 25 MG tablet, TAKE 1 TABLET BY MOUTH  DAILY, Disp: 90 tablet, Rfl: 3 .  XIIDRA 5 % SOLN, INSTILL 1 DROP INTO BOTH EYES TWICE A DAY, Disp: , Rfl:  .  beclomethasone (QVAR REDIHALER) 80 MCG/ACT inhaler, Inhale 1 puff into the lungs 2 (two) times daily. (Patient not taking: Reported on 05/14/2019), Disp: 10.6 g, Rfl: 6  Allergies  Allergen Reactions  . Lisinopril Cough  . Oxycodone-Acetaminophen Nausea Only    Objective:   BP 136/64   Pulse 71   Temp 98 F (36.7 C) (Tympanic)   LMP 03/10/2010   SpO2 99%  AAOx3, NAD NCAT, EOMI No obvious CN deficits Coloring WNL Pt is able to speak clearly, coherently without shortness of breath or increased work of breathing.  + hacking cough Thought process is linear.  Mood is appropriate.   Assessment and Plan:   Cough- deteriorated.  pt's sxs are consistent w/ post-viral cough or cough variant asthma given her chest tightness and ongoing wheezing.  She is not using her Qvar daily- she now understands the difference between this controller medication and her rescue albuterol.  Will start Prednisone taper.  She has no other sxs of COVID and this has been an ongoing issue so my suspicion is low.  Reviewed supportive care and red flags that should prompt return.  Pt expressed understanding and is in agreement w/ plan.    Neena Rhymes, MD 05/14/2019

## 2019-05-18 ENCOUNTER — Other Ambulatory Visit: Payer: Self-pay | Admitting: Family Medicine

## 2019-05-20 ENCOUNTER — Other Ambulatory Visit: Payer: Self-pay | Admitting: Family Medicine

## 2019-06-17 ENCOUNTER — Other Ambulatory Visit: Payer: Self-pay

## 2019-06-17 ENCOUNTER — Ambulatory Visit: Payer: 59 | Admitting: Family Medicine

## 2019-06-17 ENCOUNTER — Encounter: Payer: Self-pay | Admitting: Family Medicine

## 2019-06-17 ENCOUNTER — Ambulatory Visit (INDEPENDENT_AMBULATORY_CARE_PROVIDER_SITE_OTHER): Payer: 59 | Admitting: Family Medicine

## 2019-06-17 VITALS — BP 130/63 | Temp 98.3°F | Ht 62.0 in | Wt 135.0 lb

## 2019-06-17 DIAGNOSIS — J209 Acute bronchitis, unspecified: Secondary | ICD-10-CM | POA: Diagnosis not present

## 2019-06-17 DIAGNOSIS — J42 Unspecified chronic bronchitis: Secondary | ICD-10-CM

## 2019-06-17 MED ORDER — AZITHROMYCIN 250 MG PO TABS: ORAL_TABLET | ORAL | 0 refills | Status: DC

## 2019-06-17 MED ORDER — BUDESONIDE-FORMOTEROL FUMARATE 160-4.5 MCG/ACT IN AERO: 2.0000 | INHALATION_SPRAY | Freq: Two times a day (BID) | RESPIRATORY_TRACT | 3 refills | Status: DC

## 2019-06-17 NOTE — Progress Notes (Signed)
Virtual Visit via Video   I connected with patient on 06/17/19 at  9:30 AM EST by a video enabled telemedicine application and verified that I am speaking with the correct person using two identifiers.  Location patient: Home Location provider: [facility], Office Persons participating in the virtual visit: Patient, Provider, CMA (Jess B)  I discussed the limitations of evaluation and management by telemedicine and the availability of in person appointments. The patient expressed understanding and agreed to proceed.  Subjective:   HPI:   Cough/wheezing- pt struggled w/ this previously after inhaling cleaning fumes in August.  sxs returned 3 weeks ago.  'I just feel really congested in the chest'.  Cough is productive of yellow sputum.  + wheezing.  Not using Qvar regularly due to mouth sores.  Some relief w/ albuterol inhaler.  Using albuterol 4x/day.  Cough is painful- 'feels like bronchitis'.  No fevers, body aches, no change to taste or smell.  No known sick contacts- husband does not have similar sxs.  ROS:   See pertinent positives and negatives per HPI.  Patient Active Problem List   Diagnosis Date Noted  . HTN (hypertension) 11/19/2018  . Shingles 04/25/2017  . Allergic rhinitis 04/10/2017  . Physical exam 02/14/2017  . Hyperlipidemia 08/14/2016  . Anxiety 08/14/2016  . Nonallopathic lesion of cervical region 06/06/2016  . Nonallopathic lesion of thoracic region 06/06/2016  . Nonallopathic lesion of lumbosacral region 06/06/2016  . Nonallopathic lesion of sacral region 06/06/2016  . Polyarthralgia 05/09/2016  . Strain of left piriformis muscle 04/09/2016  . Heart palpitations 08/31/2015  . Heart murmur 08/31/2015  . Greater trochanteric bursitis of both hips 04/05/2015  . Postmenopausal hormone replacement therapy 01/16/2015  . Hypothyroid 01/16/2015  . Vitamin D deficiency 01/16/2015    Social History   Tobacco Use  . Smoking status: Former Smoker   Packs/day: 0.50    Years: 25.00    Pack years: 12.50    Quit date: 02/25/2005    Years since quitting: 14.3  . Smokeless tobacco: Never Used  Substance Use Topics  . Alcohol use: Yes    Alcohol/week: 5.0 standard drinks    Types: 5 Glasses of wine per week    Current Outpatient Medications:  .  albuterol (PROVENTIL HFA;VENTOLIN HFA) 108 (90 Base) MCG/ACT inhaler, Inhale 2 puffs into the lungs every 6 (six) hours as needed for wheezing or shortness of breath., Disp: 1 Inhaler, Rfl: 2 .  ALPRAZolam (XANAX) 0.5 MG tablet, Take 1 tablet (0.5 mg total) by mouth 3 (three) times daily as needed for anxiety., Disp: 30 tablet, Rfl: 1 .  Biotin 1 MG CAPS, Take by mouth., Disp: , Rfl:  .  calcium carbonate (TUMS - DOSED IN MG ELEMENTAL CALCIUM) 500 MG chewable tablet, Chew 1 tablet by mouth 3 (three) times daily as needed for indigestion or heartburn., Disp: , Rfl:  .  cetirizine (ZYRTEC) 10 MG tablet, Take 1 tablet (10 mg total) by mouth daily., Disp: 30 tablet, Rfl: 11 .  desloratadine (CLARINEX) 5 MG tablet, Take 5 mg by mouth daily., Disp: , Rfl:  .  estradiol (ESTRACE) 0.5 MG tablet, TAKE ONE-HALF TABLET BY  MOUTH DAILY, Disp: 45 tablet, Rfl: 0 .  GAVILYTE-G 236 g solution, See admin instructions., Disp: , Rfl:  .  hydrochlorothiazide (HYDRODIURIL) 12.5 MG tablet, Take 1 tablet (12.5 mg total) by mouth daily., Disp: 90 tablet, Rfl: 3 .  levothyroxine (SYNTHROID) 50 MCG tablet, TAKE 1 TABLET BY MOUTH  DAILY BEFORE  BREAKFAST, Disp: 90 tablet, Rfl: 3 .  meclizine (ANTIVERT) 25 MG tablet, Take 1 tablet (25 mg total) by mouth 3 (three) times daily as needed for dizziness., Disp: 30 tablet, Rfl: 0 .  medroxyPROGESTERone (PROVERA) 2.5 MG tablet, TAKE 1 TABLET BY MOUTH  DAILY, Disp: 90 tablet, Rfl: 0 .  meloxicam (MOBIC) 15 MG tablet, TAKE 1 TABLET BY MOUTH EVERY DAY, Disp: 30 tablet, Rfl: 1 .  rosuvastatin (CRESTOR) 5 MG tablet, TAKE 1 TABLET BY MOUTH  DAILY, Disp: 90 tablet, Rfl: 3 .  sertraline  (ZOLOFT) 25 MG tablet, TAKE 1 TABLET BY MOUTH  DAILY, Disp: 90 tablet, Rfl: 3 .  XIIDRA 5 % SOLN, INSTILL 1 DROP INTO BOTH EYES TWICE A DAY, Disp: , Rfl:  .  azelastine (ASTELIN) 0.1 % nasal spray, Place 2 sprays into both nostrils 2 (two) times daily. Use in each nostril as directed (Patient not taking: Reported on 06/17/2019), Disp: 30 mL, Rfl: 12 .  beclomethasone (QVAR REDIHALER) 80 MCG/ACT inhaler, Inhale 1 puff into the lungs 2 (two) times daily. (Patient not taking: Reported on 05/14/2019), Disp: 10.6 g, Rfl: 6 .  fluticasone (FLONASE) 50 MCG/ACT nasal spray, Place 2 sprays into both nostrils daily. (Patient not taking: Reported on 06/17/2019), Disp: 48 g, Rfl: 1  Allergies  Allergen Reactions  . Lisinopril Cough  . Oxycodone-Acetaminophen Nausea Only    Objective:   BP 130/63   Temp 98.3 F (36.8 C) (Tympanic)   Ht 5\' 2"  (1.575 m)   Wt 135 lb (61.2 kg)   LMP 03/10/2010   BMI 24.69 kg/m  AAOx3, NAD NCAT, EOMI No obvious CN deficits Coloring WNL Pt is able to speak clearly, coherently without shortness of breath or increased work of breathing.  + hacking cough Thought process is linear.  Mood is appropriate.   Assessment and Plan:   Chronic bronchitis w/ exacerbation- pt reports this feels similar to previous bouts of bronchitis.  She has had multiple issues w/ this since August.  Will start Zpack and add Symbicort daily to decrease her dependence on Albuterol.  If symptoms aren't considerably better in the next 5-7 days will need CXR and pulmonary referral to determine underlying cause.  Reviewed supportive care and red flags that should prompt return.  Pt expressed understanding and is in agreement w/ plan.    Annye Asa, MD 06/17/2019

## 2019-06-17 NOTE — Progress Notes (Signed)
I have discussed the procedure for the virtual visit with the patient who has given consent to proceed with assessment and treatment.   Jordynn Perrier L Dennies Coate, CMA     

## 2019-06-24 ENCOUNTER — Encounter: Payer: Self-pay | Admitting: Family Medicine

## 2019-07-14 ENCOUNTER — Ambulatory Visit: Payer: 59 | Admitting: Certified Nurse Midwife

## 2019-07-16 ENCOUNTER — Ambulatory Visit: Payer: 59

## 2019-07-27 ENCOUNTER — Other Ambulatory Visit: Payer: Self-pay | Admitting: Certified Nurse Midwife

## 2019-07-27 NOTE — Telephone Encounter (Signed)
Medication refill request:Estradiol Last AEX:  03-22-18 DL  Next AEX: message left for patient to call and schedule  Last MMG (if hormonal medication request): n/a Refill authorized: Today, please advise.   Medication pended for #45, 0RF. Please refill if appropriate.

## 2019-07-29 ENCOUNTER — Other Ambulatory Visit: Payer: Self-pay | Admitting: Certified Nurse Midwife

## 2019-07-30 NOTE — Telephone Encounter (Signed)
Medication refill request: provera 2.5mg  Last AEX:  04-08-2018 Next AEX: not scheduled. Left message to schedule aex Last MMG (if hormonal medication request): bilateral 03/2018, 01/12/2019 rt breast neg Refill authorized: please approve or deny as appropriate. Pharmacy note states patient needs to call to schedule yearly exam.

## 2019-08-11 ENCOUNTER — Other Ambulatory Visit: Payer: Self-pay | Admitting: Certified Nurse Midwife

## 2019-08-11 NOTE — Telephone Encounter (Signed)
Patient is requesting a refill of her medroxyprogesterone to [facility] mail service.

## 2019-08-11 NOTE — Telephone Encounter (Signed)
Medication refill request: Progesterone Last AEX:  04-08-18 DL  Next AEX: [id number] Last MMG (if hormonal medication request): 01-12-2019 BIRADS 1 negative  Refill authorized: Today, please advise.   Medication pended for #90, 0RF. Please refill if appropriate.

## 2019-08-12 MED ORDER — MEDROXYPROGESTERONE ACETATE 2.5 MG PO TABS: 2.5000 mg | ORAL_TABLET | Freq: Every day | ORAL | 0 refills | Status: DC

## 2019-08-13 ENCOUNTER — Ambulatory Visit: Payer: 59 | Admitting: Family Medicine

## 2019-08-16 ENCOUNTER — Ambulatory Visit: Payer: 59 | Admitting: Family Medicine

## 2019-08-16 ENCOUNTER — Encounter: Payer: Self-pay | Admitting: Family Medicine

## 2019-08-23 ENCOUNTER — Encounter: Payer: Self-pay | Admitting: Family Medicine

## 2019-08-23 ENCOUNTER — Ambulatory Visit (INDEPENDENT_AMBULATORY_CARE_PROVIDER_SITE_OTHER): Payer: 59 | Admitting: Certified Nurse Midwife

## 2019-08-23 ENCOUNTER — Other Ambulatory Visit: Payer: Self-pay

## 2019-08-23 ENCOUNTER — Encounter: Payer: Self-pay | Admitting: Certified Nurse Midwife

## 2019-08-23 VITALS — BP 108/78 | HR 68 | Temp 98.2°F | Resp 16 | Ht 61.75 in | Wt 135.0 lb

## 2019-08-23 DIAGNOSIS — Z01419 Encounter for gynecological examination (general) (routine) without abnormal findings: Secondary | ICD-10-CM | POA: Diagnosis not present

## 2019-08-23 DIAGNOSIS — N951 Menopausal and female climacteric states: Secondary | ICD-10-CM

## 2019-08-23 NOTE — Progress Notes (Signed)
64 y.o. [id number] Married  Caucasian Fe here for annual exam. Post menopausal, denies vaginal bleeding or vaginal dryness.  Continues on HRT and feels she ready to try to stop now. Denies vasomotor symptoms as before. She has been doing well with the half tablet of estrogen. Cholesterol, hypothyroid and anxiety medication stable,but cholesterol being checked more frequently due to concerns by PCP. Has continued with sinus issues, but feel this the pollen now. No other health issues today.   Patient's last menstrual period was 03/10/2010.          Sexually active: No.  The current method of family planning is tubal ligation.    Exercising: Yes.    walking Smoker:  no  Review of Systems  Constitutional: Negative.   HENT: Negative.   Eyes: Negative.   Respiratory: Negative.   Cardiovascular: Negative.   Gastrointestinal: Negative.   Genitourinary: Negative.   Musculoskeletal: Negative.   Skin: Negative.   Neurological: Negative.   Endo/Heme/Allergies: Negative.   Psychiatric/Behavioral: Negative.     Health Maintenance: Pap:  01-16-15 neg HPV HR neg, 04-09-18 neg HPV HR neg History of Abnormal Pap: no MMG:  01/2019 bilateral & rt breast category c density birads 1:neg Self Breast exams: yes Colonoscopy:  2020 f/u 35yrs BMD:   10/18 due this year TDaP:  2015 Shingles: no Pneumonia: no Hep C and HIV: both neg per patient Labs: if needed   reports that she quit smoking about 14 years ago. She has a 12.50 pack-year smoking history. She has never used smokeless tobacco. She reports current alcohol use of about 5.0 standard drinks of alcohol per week. She reports that she does not use drugs.  Past Medical History:  Diagnosis Date  . Allergy    Dogs, dust, and mold  . Anxiety   . Arthritis   . Benign essential HTN 08/31/2015  . Chronic neck pain   . Colon polyps   . Depression   . Heart murmur   . Heart murmur   . Hypercholesteremia   . Hyperlipidemia 05/2005  . Hypothyroid  06/2009  . Osteoarthritis of thoracic spine   . Shingles 06/2011    Past Surgical History:  Procedure Laterality Date  . COLONOSCOPY  2009  . TUBAL LIGATION  1984 & 05/2005   reversal in 1995  . tubal reversal      Current Outpatient Medications  Medication Sig Dispense Refill  . albuterol (PROVENTIL HFA;VENTOLIN HFA) 108 (90 Base) MCG/ACT inhaler Inhale 2 puffs into the lungs every 6 (six) hours as needed for wheezing or shortness of breath. 1 Inhaler 2  . ALPRAZolam (XANAX) 0.5 MG tablet Take 1 tablet (0.5 mg total) by mouth 3 (three) times daily as needed for anxiety. 30 tablet 1  . azelastine (ASTELIN) 0.1 % nasal spray Place 2 sprays into both nostrils 2 (two) times daily. Use in each nostril as directed 30 mL 12  . Biotin 1 MG CAPS Take by mouth.    . calcium carbonate (TUMS - DOSED IN MG ELEMENTAL CALCIUM) 500 MG chewable tablet Chew 1 tablet by mouth 3 (three) times daily as needed for indigestion or heartburn.    . cetirizine (ZYRTEC) 10 MG tablet Take 1 tablet (10 mg total) by mouth daily. 30 tablet 11  . estradiol (ESTRACE) 0.5 MG tablet TAKE ONE-HALF TABLET BY  MOUTH DAILY 45 tablet 0  . fluticasone (FLONASE) 50 MCG/ACT nasal spray Place 2 sprays into both nostrils daily. 48 g 1  . hydrochlorothiazide (HYDRODIURIL) 12.5  MG tablet Take 1 tablet (12.5 mg total) by mouth daily. 90 tablet 3  . levothyroxine (SYNTHROID) 50 MCG tablet TAKE 1 TABLET BY MOUTH  DAILY BEFORE BREAKFAST 90 tablet 3  . meclizine (ANTIVERT) 25 MG tablet Take 1 tablet (25 mg total) by mouth 3 (three) times daily as needed for dizziness. 30 tablet 0  . medroxyPROGESTERone (PROVERA) 2.5 MG tablet Take 1 tablet (2.5 mg total) by mouth daily. 90 tablet 0  . meloxicam (MOBIC) 15 MG tablet TAKE 1 TABLET BY MOUTH EVERY DAY 30 tablet 1  . rosuvastatin (CRESTOR) 5 MG tablet TAKE 1 TABLET BY MOUTH  DAILY 90 tablet 3  . sertraline (ZOLOFT) 25 MG tablet TAKE 1 TABLET BY MOUTH  DAILY 90 tablet 3  . VITAMIN D PO Take  2,000 Int'l Units by mouth.     No current facility-administered medications for this visit.    Family History  Problem Relation Age of Onset  . Thyroid disease Mother   . Lung cancer Mother   . CVA Father   . Diabetes Father   . Hyperlipidemia Father   . Hashimoto's thyroiditis Sister   . CVA Paternal Grandfather   . CVA Paternal Grandmother   . Breast cancer Neg Hx     ROS:  Pertinent items are noted in HPI.  Otherwise, a comprehensive ROS was negative.  Exam:   BP 108/78   Pulse 68   Temp 98.2 F (36.8 C) (Skin)   Resp 16   Ht 5' 1.75" (1.568 m)   Wt 135 lb (61.2 kg)   LMP 03/10/2010   BMI 24.89 kg/m  Height: 5' 1.75" (156.8 cm) Ht Readings from Last 3 Encounters:  08/23/19 5' 1.75" (1.568 m)  06/17/19 5\' 2"  (1.575 m)  04/01/19 5\' 2"  (1.575 m)    General appearance: alert, cooperative and appears stated age Head: Normocephalic, without obvious abnormality, atraumatic Neck: no adenopathy, supple, symmetrical, trachea midline and thyroid normal to inspection and palpation Lungs: clear to auscultation bilaterally Breasts: normal appearance, no masses or tenderness, No nipple retraction or dimpling, No nipple discharge or bleeding, No axillary or supraclavicular adenopathy Heart: regular rate and rhythm Abdomen: soft, non-tender; no masses,  no organomegaly Extremities: extremities normal, atraumatic, no cyanosis or edema Skin: Skin color, texture, turgor normal. No rashes or lesions Lymph nodes: Cervical, supraclavicular, and axillary nodes normal. No abnormal inguinal nodes palpated Neurologic: Grossly normal   Pelvic: External genitalia:  no lesions              Urethra:  normal appearing urethra with no masses, tenderness or lesions              Bartholin's and Skene's: normal                 Vagina: normal appearing vagina with normal color and discharge, no lesions              Cervix: no cervical motion tenderness, no lesions and normal appearance               Pap taken: No. Bimanual Exam:  Uterus:  normal size, contour, position, consistency, mobility, non-tender and anteverted              Adnexa: normal adnexa and no mass, fullness, tenderness               Rectovaginal: Confirms               Anus:  normal sphincter tone, no  lesions  Chaperone present: yes  A:  Well Woman with normal exam  Post menopausal on HRT  BMD and Mammogram(scheduled)  Asthma, hypertension,anxiety management with PCP  P:   Reviewed health and wellness pertinent to exam  Discussed risks and benefits of continued use of HRT. Patient feels she wants to wean off now. She has enough of her estrogen and will reduce down to 6 days a week with her progesterone daily and gradually decrease until has stopped. Does not refill on RX. Will advise if issues and when she has stopped.  Discussed BMD due, order placed to schedule with mammogram.  Continue follow up with PCP as indicated.  Pap smear: no  counseled on breast self exam, mammography screening, feminine hygiene, use and side effects of HRT, menopause, osteoporosis, adequate intake of calcium and vitamin D, diet and exercise  return annually or prn  An After Visit Summary was printed and given to the patient.

## 2019-08-23 NOTE — Patient Instructions (Signed)
EXERCISE AND DIET:  We recommended that you start or continue a regular exercise program for good health. Regular exercise means any activity that makes your heart beat faster and makes you sweat.  We recommend exercising at least 30 minutes per day at least 3 days a week, preferably 4 or 5.  We also recommend a diet low in fat and sugar.  Inactivity, poor dietary choices and obesity can cause diabetes, heart attack, stroke, and kidney damage, among others.    ALCOHOL AND SMOKING:  Women should limit their alcohol intake to no more than 7 drinks/beers/glasses of wine (combined, not each!) per week. Moderation of alcohol intake to this level decreases your risk of breast cancer and liver damage. And of course, no recreational drugs are part of a healthy lifestyle.  And absolutely no smoking or even second hand smoke. Most people know smoking can cause heart and lung diseases, but did you know it also contributes to weakening of your bones? Aging of your skin?  Yellowing of your teeth and nails?  CALCIUM AND VITAMIN D:  Adequate intake of calcium and Vitamin D are recommended.  The recommendations for exact amounts of these supplements seem to change often, but generally speaking 600 mg of calcium (either carbonate or citrate) and 800 units of Vitamin D per day seems prudent. Certain women may benefit from higher intake of Vitamin D.  If you are among these women, your doctor will have told you during your visit.    PAP SMEARS:  Pap smears, to check for cervical cancer or precancers,  have traditionally been done yearly, although recent scientific advances have shown that most women can have pap smears less often.  However, every woman still should have a physical exam from her gynecologist every year. It will include a breast check, inspection of the vulva and vagina to check for abnormal growths or skin changes, a visual exam of the cervix, and then an exam to evaluate the size and shape of the uterus and  ovaries.  And after 63 years of age, a rectal exam is indicated to check for rectal cancers. We will also provide age appropriate advice regarding health maintenance, like when you should have certain vaccines, screening for sexually transmitted diseases, bone density testing, colonoscopy, mammograms, etc.   MAMMOGRAMS:  All women over 40 years old should have a yearly mammogram. Many facilities now offer a "3D" mammogram, which may cost around $50 extra out of pocket. If possible,  we recommend you accept the option to have the 3D mammogram performed.  It both reduces the number of women who will be called back for extra views which then turn out to be normal, and it is better than the routine mammogram at detecting truly abnormal areas.    COLONOSCOPY:  Colonoscopy to screen for colon cancer is recommended for all women at age 50.  We know, you hate the idea of the prep.  We agree, BUT, having colon cancer and not knowing it is worse!!  Colon cancer so often starts as a polyp that can be seen and removed at colonscopy, which can quite literally save your life!  And if your first colonoscopy is normal and you have no family history of colon cancer, most women don't have to have it again for 10 years.  Once every ten years, you can do something that may end up saving your life, right?  We will be happy to help you get it scheduled when you are ready.    Be sure to check your insurance coverage so you understand how much it will cost.  It may be covered as a preventative service at no cost, but you should check your particular policy.      Menopause and Hormone Replacement Therapy Menopause is a normal time of life when menstrual periods stop completely and the ovaries stop producing the female hormones estrogen and progesterone. This lack of hormones can affect your health and cause undesirable symptoms. Hormone replacement therapy (HRT) can relieve some of those symptoms. What is hormone replacement  therapy? HRT is the use of artificial (synthetic) hormones to replace hormones that your body has stopped producing because you have reached menopause. What are my options for HRT?  HRT may consist of the synthetic hormones estrogen and progestin, or it may consist of only estrogen (estrogen-only therapy). You and your health care provider will decide which form of HRT is best for you. If you choose to be on HRT and you have a uterus, estrogen and progestin are usually prescribed. Estrogen-only therapy is used for women who do not have a uterus. Possible options for taking HRT include:  Pills.  Patches.  Gels.  Sprays.  Vaginal cream.  Vaginal rings.  Vaginal inserts. The amount of hormone(s) that you take and how long you take the hormone(s) varies according to your health. It is important to:  Begin HRT with the lowest possible dosage.  Stop HRT as soon as your health care provider tells you to stop.  Work with your health care provider so that you feel informed and comfortable with your decisions. What are the benefits of HRT? HRT can reduce the frequency and severity of menopausal symptoms. Benefits of HRT vary according to the kind of symptoms that you have, how severe they are, and your overall health. HRT may help to improve the following symptoms of menopause:  Hot flashes and night sweats. These are sudden feelings of heat that spread over the face and body. The skin may turn red, like a blush. Night sweats are hot flashes that happen while you are sleeping or trying to sleep.  Bone loss (osteoporosis). The body loses calcium more quickly after menopause, causing the bones to become weaker. This can increase the risk for bone breaks (fractures).  Vaginal dryness. The lining of the vagina can become thin and dry, which can cause pain during sex or cause infection, burning, or itching.  Urinary tract infections.  Urinary incontinence. This is the inability to control  when you pass urine.  Irritability.  Short-term memory problems. What are the risks of HRT? Risks of HRT vary depending on your individual health and medical history. Risks of HRT also depend on whether you receive both estrogen and progestin or you receive estrogen only. HRT may increase the risk of:  Spotting. This is when a small amount of blood leaks from the vagina unexpectedly.  Endometrial cancer. This cancer is in the lining of the uterus (endometrium).  Breast cancer.  Increased density of breast tissue. This can make it harder to find breast cancer on a breast X-ray (mammogram).  Stroke.  Heart disease.  Blood clots.  Gallbladder disease.  Liver disease. Risks of HRT can increase if you have any of the following conditions:  Endometrial cancer.  Liver disease.  Heart disease.  Breast cancer.  History of blood clots.  History of stroke. Follow these instructions at home:  Take over-the-counter and prescription medicines only as told by your health care provider.    Get mammograms, pelvic exams, and medical checkups as often as told by your health care provider.  Have Pap tests done as often as told by your health care provider. A Pap test is sometimes called a Pap smear. It is a screening test that is used to check for signs of cancer of the cervix and vagina. A Pap test can also identify the presence of infection or precancerous changes. Pap tests may be done: ? Every 3 years, starting at age 21. ? Every 5 years, starting after age 30, in combination with testing for human papillomavirus (HPV). ? More often or less often depending on other medical conditions you have, your age, and other risk factors.  It is up to you to get the results of your Pap test. Ask your health care provider, or the department that is doing the test, when your results will be ready.  Keep all follow-up visits as told by your health care provider. This is important. Contact a health  care provider if you have:  Pain or swelling in your legs.  Shortness of breath.  Chest pain.  Lumps or changes in your breasts or armpits.  Slurred speech.  Pain, burning, or bleeding when you urinate.  Unusual vaginal bleeding.  Dizziness or headaches.  Weakness or numbness in any part of your arms or legs.  Pain in your abdomen. Summary  Menopause is a normal time of life when menstrual periods stop completely and the ovaries stop producing the female hormones estrogen and progesterone.  Hormone replacement therapy (HRT) can relieve some of the symptoms of menopause.  HRT can reduce the frequency and severity of menopausal symptoms.  Risks of HRT vary depending on your individual health and medical history. This information is not intended to replace advice given to you by your health care provider. Make sure you discuss any questions you have with your health care provider. Document Revised: 01/27/2018 Document Reviewed: 01/27/2018 Elsevier Patient Education  2020 [employer]  

## 2019-08-24 ENCOUNTER — Encounter: Payer: 59 | Admitting: Family Medicine

## 2019-08-25 NOTE — Progress Notes (Signed)
This encounter was created in error - please disregard.

## 2019-08-31 ENCOUNTER — Encounter: Payer: Self-pay | Admitting: Certified Nurse Midwife

## 2019-09-01 ENCOUNTER — Ambulatory Visit: Payer: 59

## 2019-09-06 ENCOUNTER — Telehealth: Payer: Self-pay | Admitting: Obstetrics & Gynecology

## 2019-09-06 DIAGNOSIS — Z01419 Encounter for gynecological examination (general) (routine) without abnormal findings: Secondary | ICD-10-CM

## 2019-09-06 MED ORDER — ESTRADIOL 0.5 MG PO TABS: ORAL_TABLET | ORAL | 0 refills | Status: DC

## 2019-09-06 NOTE — Telephone Encounter (Signed)
Chart reviewed, refill of estrace sent. Continue on daily provera until she is off of the estrogen.

## 2019-09-06 NOTE — Telephone Encounter (Signed)
[facility] [place], [place] . Patient is requesting a refill of her estradiol.

## 2019-09-06 NOTE — Telephone Encounter (Signed)
Med refill request: Estrace tablets  Last AEX:08/23/2019 with DL Next AEX: [id number]/3709 with SM  Last MMG (if hormonal med) scheduled for 11/04/2019 and last 01/2019 with Birads 1, Negative. Refill authorized: Spoke to pt. Pt states Debbi, CNM filled Provera instead of Estrace and is now out of Estrace. Pt trying to wean off in the next month. Pt requesting #30 tabs, 0RF. Pended if approved. Pharmacy verified.    From AEX with DL,    Discussed risks and benefits of continued use of HRT. Patient feels she wants to wean off now. She has enough of her estrogen and will reduce down to 6 days a week with her progesterone daily and gradually decrease until has stopped. Does not refill on RX. Will advise if issues and when she has stopped.   Routing to Dr Oscar La for review and Rx request.   Cc: Dr Hyacinth Meeker for [facility].

## 2019-09-06 NOTE — Telephone Encounter (Signed)
Left detailed message per DPR. Pt given update on Rx and instructions on taking Estrace with Provera. Pt to call with any questions or concerns.  Routing to Dr Oscar La and will close encounter.

## 2019-09-14 ENCOUNTER — Ambulatory Visit: Payer: 59

## 2019-09-29 ENCOUNTER — Encounter: Payer: Self-pay | Admitting: Family Medicine

## 2019-09-29 ENCOUNTER — Ambulatory Visit (INDEPENDENT_AMBULATORY_CARE_PROVIDER_SITE_OTHER): Payer: 59 | Admitting: Family Medicine

## 2019-09-29 ENCOUNTER — Other Ambulatory Visit: Payer: Self-pay

## 2019-09-29 VITALS — BP 110/80 | HR 78 | Temp 98.0°F | Resp 16 | Ht 62.0 in | Wt 138.2 lb

## 2019-09-29 DIAGNOSIS — E039 Hypothyroidism, unspecified: Secondary | ICD-10-CM

## 2019-09-29 DIAGNOSIS — E785 Hyperlipidemia, unspecified: Secondary | ICD-10-CM | POA: Diagnosis not present

## 2019-09-29 DIAGNOSIS — I10 Essential (primary) hypertension: Secondary | ICD-10-CM

## 2019-09-29 DIAGNOSIS — Z23 Encounter for immunization: Secondary | ICD-10-CM

## 2019-09-29 LAB — BASIC METABOLIC PANEL
BUN: 17 mg/dL (ref 6–23)
CO2: 32 mEq/L (ref 19–32)
Calcium: 9.3 mg/dL (ref 8.4–10.5)
Chloride: 101 mEq/L (ref 96–112)
Creatinine, Ser: 0.74 mg/dL (ref 0.40–1.20)
GFR: 79.22 mL/min (ref >60.00)
Glucose, Bld: 106 mg/dL — ABNORMAL HIGH (ref 70–99)
Potassium: 3.9 mEq/L (ref 3.5–5.1)
Sodium: 142 mEq/L (ref 135–145)

## 2019-09-29 LAB — LIPID PANEL
Cholesterol: 184 mg/dL (ref 0–200)
HDL: 54 mg/dL (ref >39.00)
LDL Cholesterol: 99 mg/dL (ref 0–99)
NonHDL: 129.78
Total CHOL/HDL Ratio: 3
Triglycerides: 156 mg/dL — ABNORMAL HIGH (ref 0.0–149.0)
VLDL: 31.2 mg/dL (ref 0.0–40.0)

## 2019-09-29 LAB — TSH: TSH: 3.36 u[IU]/mL (ref 0.35–4.50)

## 2019-09-29 LAB — HEPATIC FUNCTION PANEL
ALT: 16 U/L (ref 0–35)
AST: 23 U/L (ref 0–37)
Albumin: 4.4 g/dL (ref 3.5–5.2)
Alkaline Phosphatase: 51 U/L (ref 39–117)
Bilirubin, Direct: 0.1 mg/dL (ref 0.0–0.3)
Total Bilirubin: 0.6 mg/dL (ref 0.2–1.2)
Total Protein: 6.4 g/dL (ref 6.0–8.3)

## 2019-09-29 NOTE — Progress Notes (Signed)
   Subjective:    Patient ID: Sonia Kim, female    DOB: 1955/09/12, 64 y.o.   MRN: [id number]  HPI Hyperlipidemia- chronic problem, on Crestor 5mg .  Denies abd pain, N/V.  Continues to walk regularly- 2 miles/day  HTN- chronic problem, on HCTZ 12.5mg  daily.  Denies CP, SOB, HAs, visual changes, edema.  Hypothyroid- chronic problem, on Levothyroxine .  + fatigue.  Denies changes to skin/hair/nails.   Review of Systems For ROS see HPI   This visit occurred during the SARS-CoV-2 public health emergency.  Safety protocols were in place, including screening questions prior to the visit, additional usage of staff PPE, and extensive cleaning of exam room while observing appropriate contact time as indicated for disinfecting solutions.     Objective:   Physical Exam Vitals reviewed.  Constitutional:      General: She is not in acute distress.    Appearance: Normal appearance. She is well-developed.  HENT:     Head: Normocephalic and atraumatic.  Eyes:     Conjunctiva/sclera: Conjunctivae normal.     Pupils: Pupils are equal, round, and reactive to light.  Neck:     Thyroid: No thyromegaly.  Cardiovascular:     Rate and Rhythm: Normal rate and regular rhythm.     Heart sounds: Normal heart sounds. No murmur.  Pulmonary:     Effort: Pulmonary effort is normal. No respiratory distress.     Breath sounds: Normal breath sounds.  Abdominal:     General: There is no distension.     Palpations: Abdomen is soft.     Tenderness: There is no abdominal tenderness.  Musculoskeletal:     Cervical back: Normal range of motion and neck supple.  Lymphadenopathy:     Cervical: No cervical adenopathy.  Skin:    General: Skin is warm and dry.  Neurological:     Mental Status: She is alert and oriented to person, place, and time.  Psychiatric:        Behavior: Behavior normal.           Assessment & Plan:

## 2019-09-29 NOTE — Assessment & Plan Note (Signed)
Chronic problem.  + fatigue.  Check labs.  Adjust meds prn  

## 2019-09-29 NOTE — Patient Instructions (Addendum)
Schedule your complete physical in 6 months (and 2nd shingles shot) We'll notify you of your lab results and make any changes if needed Continue to work on healthy diet and regular exercise- you look great! Call with any questions or concerns Stay Safe!  Stay Healthy!

## 2019-09-29 NOTE — Assessment & Plan Note (Signed)
Chronic problem.  Well controlled today.  Asymptomatic.  Check labs.  No anticipated med changes.  Will follow. 

## 2019-09-29 NOTE — Addendum Note (Signed)
Addended by: Geannie Risen on: 09/29/2019 09:27 AM   Modules accepted: Orders

## 2019-09-29 NOTE — Assessment & Plan Note (Signed)
Chronic problem.  Tolerating statin w/o difficulty.  Exercising regularly.  Check labs.  Adjust meds prn  

## 2019-09-30 ENCOUNTER — Encounter: Payer: Self-pay | Admitting: Family Medicine

## 2019-09-30 LAB — CBC WITH DIFFERENTIAL/PLATELET
Basophils Absolute: 0.1 10*3/uL (ref 0.0–0.1)
Basophils Relative: 0.8 % (ref 0.0–3.0)
Eosinophils Absolute: 0.4 10*3/uL (ref 0.0–0.7)
Eosinophils Relative: 5.6 % — ABNORMAL HIGH (ref 0.0–5.0)
HCT: 42 % (ref 36.0–46.0)
Hemoglobin: 14.1 g/dL (ref 12.0–15.0)
Lymphocytes Relative: 25 % (ref 12.0–46.0)
Lymphs Abs: 1.7 10*3/uL (ref 0.7–4.0)
MCHC: 33.6 g/dL (ref 30.0–36.0)
MCV: 96 fl (ref 78.0–100.0)
Monocytes Absolute: 0.5 10*3/uL (ref 0.1–1.0)
Monocytes Relative: 7.9 % (ref 3.0–12.0)
Neutro Abs: 4.1 10*3/uL (ref 1.4–7.7)
Neutrophils Relative %: 60.7 % (ref 43.0–77.0)
Platelets: 202 10*3/uL (ref 150.0–400.0)
RBC: 4.37 Mil/uL (ref 3.87–5.11)
RDW: 13.1 % (ref 11.5–15.5)
WBC: 6.7 10*3/uL (ref 4.0–10.5)

## 2019-09-30 MED ORDER — HYDROCHLOROTHIAZIDE 12.5 MG PO TABS: 12.5000 mg | ORAL_TABLET | Freq: Every day | ORAL | 1 refills | Status: DC

## 2019-10-06 ENCOUNTER — Other Ambulatory Visit: Payer: Self-pay | Admitting: Family Medicine

## 2019-10-22 ENCOUNTER — Telehealth: Payer: Self-pay | Admitting: *Deleted

## 2019-10-22 NOTE — Telephone Encounter (Signed)
Left message to call Noreene Larsson, RN at [facility] [phone].  Has patient weaned off of provera?    Refill request received from [facility] for Provera 2.5mg  tab.   Per review of AEX 08/23/19, patient was going to wean off of estradiol and provera.   Last screening MMG 03/23/18, neg.  Right breast Dx MMG 01/12/19, neg.  Screening MMG scheduled for 11/04/19 Next AEX 11/04/19 with Dr. Hyacinth Meeker

## 2019-10-27 NOTE — Telephone Encounter (Signed)
Spoke with patient. Patient reports she is no longer taking Provera or estradiol 0.5 mg tab. She has weaned of of HRT and is doing well at this time. Patient will call if any concerns. Patient thankful for f/u.   Med list updated.   Routing to provider for final review. Patient is agreeable to disposition. Will close encounter.

## 2019-11-04 ENCOUNTER — Ambulatory Visit: Payer: 59

## 2019-11-10 ENCOUNTER — Telehealth: Payer: Self-pay

## 2019-11-10 NOTE — Telephone Encounter (Signed)
08/23/19 AEX with DL Last MMG 06/11/50 Birads 1, neg. Next scheduled 11/12/19 at [facility].   Spoke with pt. Pt states having hot flashes multiple times a day and night disturbances since stopping HRT x 2 months ago. Pt was taking estradiol and Provera. Pt states did not wean, but quit "cold [place]" and was doing well until the last 2 weeks.  Pt wanting to know if can restart HRT or asking  other ways to control vasomotor sx. Pt states has left over Rx. Pt states is having light vaginal dryness, but is using coconut oil PRN and it resolves.  Pt denies vaginal bleeding, fever, chills or any abd pain or cramping.  Advised will review with Dr Hyacinth Meeker and return call to pt. Pt agreeable.   Routing to Dr Hyacinth Meeker.

## 2019-11-10 NOTE — Telephone Encounter (Signed)
Patient is calling in regards to wanting advice on hormone medication. Patient states at her last appointment with Dr. Darcel Bayley, the doctor wanted her to wean off the hormone medication. Patient states she has not taking the medicine in 2 months and is experiencing hot flashes multiple times a day and is having trouble sleeping at night.

## 2019-11-12 ENCOUNTER — Ambulatory Visit
Admission: RE | Admit: 2019-11-12 | Discharge: 2019-11-12 | Disposition: A | Discharge status: Home / Self Care | Payer: 59 | Source: Ambulatory Visit | Attending: Certified Nurse Midwife | Admitting: Certified Nurse Midwife

## 2019-11-12 ENCOUNTER — Other Ambulatory Visit: Payer: Self-pay | Admitting: Obstetrics & Gynecology

## 2019-11-12 ENCOUNTER — Other Ambulatory Visit: Payer: Self-pay

## 2019-11-12 DIAGNOSIS — Z1231 Encounter for screening mammogram for malignant neoplasm of breast: Secondary | ICD-10-CM

## 2019-11-15 NOTE — Telephone Encounter (Signed)
Patient restarted her HRT and would like refill sof her estradiol to [facility] mail order.

## 2019-11-17 ENCOUNTER — Other Ambulatory Visit: Payer: Self-pay | Admitting: Obstetrics & Gynecology

## 2019-11-17 MED ORDER — ESTRADIOL 0.5 MG PO TABS: 0.2500 mg | ORAL_TABLET | Freq: Every day | ORAL | 3 refills | Status: DC

## 2019-11-17 MED ORDER — MEDROXYPROGESTERONE ACETATE 2.5 MG PO TABS: 2.5000 mg | ORAL_TABLET | Freq: Every day | ORAL | 3 refills | Status: DC

## 2019-11-17 NOTE — Telephone Encounter (Signed)
Message left to return call to Triage Nurse at [phone].    

## 2019-11-17 NOTE — Telephone Encounter (Signed)
Call to patient. Patient notified of message as seen below from Dr. Hyacinth Meeker and verbalized understanding.   Encounter closed.

## 2019-11-17 NOTE — Telephone Encounter (Signed)
Patient is requesting an update on her estradiol refill and she is still waiting to hear back from Dr.Miller's nurse.

## 2019-11-17 NOTE — Telephone Encounter (Signed)
Rx for estradiol 0.5mg  1/2 tab daily and provera 2.5mg  tab daily sent to mail order.  If would like to try again to stop or decrease HRT further, we can taper this further and I'm happy at that time to give her instructions.  Former pt of [place], [place].

## 2019-12-24 ENCOUNTER — Other Ambulatory Visit: Payer: Self-pay

## 2019-12-24 ENCOUNTER — Telehealth (INDEPENDENT_AMBULATORY_CARE_PROVIDER_SITE_OTHER): Payer: 59 | Admitting: Physician Assistant

## 2019-12-24 ENCOUNTER — Encounter: Payer: Self-pay | Admitting: Physician Assistant

## 2019-12-24 ENCOUNTER — Other Ambulatory Visit: Payer: Self-pay | Admitting: Physician Assistant

## 2019-12-24 DIAGNOSIS — J4521 Mild intermittent asthma with (acute) exacerbation: Secondary | ICD-10-CM

## 2019-12-24 MED ORDER — QVAR REDIHALER 80 MCG/ACT IN AERB
1.0000 | INHALATION_SPRAY | Freq: Two times a day (BID) | RESPIRATORY_TRACT | 0 refills | Status: DC
Start: 2019-12-24 — End: 2020-01-10

## 2019-12-24 MED ORDER — PREDNISONE 20 MG PO TABS: 40.0000 mg | ORAL_TABLET | Freq: Every day | ORAL | 0 refills | Status: DC

## 2019-12-24 NOTE — Progress Notes (Signed)
Virtual Visit via Video   I connected with patient on 12/24/19 at  4:00 PM EDT by a video enabled telemedicine application and verified that I am speaking with the correct person using two identifiers.  Location patient: Home Location provider: [place], Office Persons participating in the virtual visit: Patient, Provider, CMA (Patina Moore)  I discussed the limitations of evaluation and management by telemedicine and the availability of in person appointments. The patient expressed understanding and agreed to proceed.  Subjective:   HPI:   Patient presents via [facility] today c/o 3 days of increased asthmatic symptoms. Notes some chest tightness with wheeze, worse at night. Denies SOB at rest. Denies nasal congestion or chest congestion. Has coughed up some clear phlegm. Denies sick contact. Denies fever, chills, malaise. Notes is very humid in the area she is in currently in [place]. Notes as soon as she arrived there she could tell a difference in her breathing. Is using her albuterol inhaler but only with some relief. Otherwise she says she feels just fine.   ROS:   See pertinent positives and negatives per HPI.  Patient Active Problem List   Diagnosis Date Noted  . HTN (hypertension) 11/19/2018  . Shingles 04/25/2017  . Allergic rhinitis 04/10/2017  . Physical exam 02/14/2017  . Hyperlipidemia 08/14/2016  . Anxiety 08/14/2016  . Nonallopathic lesion of cervical region 06/06/2016  . Nonallopathic lesion of thoracic region 06/06/2016  . Nonallopathic lesion of lumbosacral region 06/06/2016  . Nonallopathic lesion of sacral region 06/06/2016  . Polyarthralgia 05/09/2016  . Strain of left piriformis muscle 04/09/2016  . Heart palpitations 08/31/2015  . Heart murmur 08/31/2015  . Greater trochanteric bursitis of both hips 04/05/2015  . Postmenopausal hormone replacement therapy 01/16/2015  . Hypothyroid 01/16/2015  . Vitamin D deficiency 01/16/2015    Social  History   Tobacco Use  . Smoking status: Former Smoker    Packs/day: 0.50    Years: 25.00    Pack years: 12.50    Quit date: 02/25/2005    Years since quitting: 14.8  . Smokeless tobacco: Never Used  Substance Use Topics  . Alcohol use: Yes    Alcohol/week: 5.0 standard drinks    Types: 5 Glasses of wine per week    Current Outpatient Medications:  .  albuterol (PROVENTIL HFA;VENTOLIN HFA) 108 (90 Base) MCG/ACT inhaler, Inhale 2 puffs into the lungs every 6 (six) hours as needed for wheezing or shortness of breath., Disp: 1 Inhaler, Rfl: 2 .  ALPRAZolam (XANAX) 0.5 MG tablet, Take 1 tablet (0.5 mg total) by mouth 3 (three) times daily as needed for anxiety., Disp: 30 tablet, Rfl: 1 .  azelastine (ASTELIN) 0.1 % nasal spray, Place 2 sprays into both nostrils 2 (two) times daily. Use in each nostril as directed, Disp: 30 mL, Rfl: 12 .  Biotin 1 MG CAPS, Take by mouth., Disp: , Rfl:  .  calcium carbonate (TUMS - DOSED IN MG ELEMENTAL CALCIUM) 500 MG chewable tablet, Chew 1 tablet by mouth 3 (three) times daily as needed for indigestion or heartburn., Disp: , Rfl:  .  cetirizine (ZYRTEC) 10 MG tablet, Take 1 tablet (10 mg total) by mouth daily., Disp: 30 tablet, Rfl: 11 .  estradiol (ESTRACE) 0.5 MG tablet, Take 0.5 tablets (0.25 mg total) by mouth daily., Disp: 45 tablet, Rfl: 3 .  fluticasone (FLONASE) 50 MCG/ACT nasal spray, USE 2 SPRAYS IN EACH  NOSTRIL DAILY, Disp: 48 g, Rfl: 1 .  hydrochlorothiazide (HYDRODIURIL) 12.5 MG tablet,  Take 1 tablet (12.5 mg total) by mouth daily., Disp: 90 tablet, Rfl: 1 .  levothyroxine (SYNTHROID) 50 MCG tablet, TAKE 1 TABLET BY MOUTH  DAILY BEFORE BREAKFAST, Disp: 90 tablet, Rfl: 3 .  meclizine (ANTIVERT) 25 MG tablet, Take 1 tablet (25 mg total) by mouth 3 (three) times daily as needed for dizziness., Disp: 30 tablet, Rfl: 0 .  medroxyPROGESTERone (PROVERA) 2.5 MG tablet, Take 1 tablet (2.5 mg total) by mouth daily., Disp: 90 tablet, Rfl: 3 .  meloxicam  (MOBIC) 15 MG tablet, TAKE 1 TABLET BY MOUTH EVERY DAY, Disp: 30 tablet, Rfl: 1 .  rosuvastatin (CRESTOR) 5 MG tablet, TAKE 1 TABLET BY MOUTH  DAILY, Disp: 90 tablet, Rfl: 3 .  sertraline (ZOLOFT) 25 MG tablet, TAKE 1 TABLET BY MOUTH  DAILY, Disp: 90 tablet, Rfl: 3 .  VITAMIN D PO, Take 2,000 Int'l Units by mouth., Disp: , Rfl:   Allergies  Allergen Reactions  . Lisinopril Cough  . Oxycodone-Acetaminophen Nausea Only    Objective:   LMP 03/10/2010   Patient is well-developed, well-nourished in no acute distress.  Resting comfortably at home.  Head is normocephalic, atraumatic.  No labored breathing.  Speech is clear and coherent with logical content.  Patient is alert and oriented at baseline.   Assessment and Plan:   1. Mild intermittent asthma with exacerbation Afebrile. No productive cough. Feels well other than chest tightness and wheeze which seems triggered by change in environment and climate. Rx prednisone burst. Rx Qvar. Since she is out of state discussed with her that if anything worsens she needs to seek care immediately. Patient voiced understanding and agreement with the plan.     Piedad Climes, PA-C 12/24/2019

## 2019-12-24 NOTE — Progress Notes (Signed)
I have discussed the procedure for the virtual visit with the patient who has given consent to proceed with assessment and treatment.   Genita Nilsson S Eaton Folmar, CMA     

## 2020-01-09 ENCOUNTER — Other Ambulatory Visit: Payer: Self-pay | Admitting: Family Medicine

## 2020-01-09 MED ORDER — ALBUTEROL SULFATE HFA 108 (90 BASE) MCG/ACT IN AERS: 2.0000 | INHALATION_SPRAY | Freq: Four times a day (QID) | RESPIRATORY_TRACT | 0 refills | Status: DC | PRN

## 2020-01-09 NOTE — Progress Notes (Signed)
Received request for albuterol inhaler on after hours line. Refilled [facility] to [facility] [place] for pt.

## 2020-01-10 ENCOUNTER — Telehealth: Payer: Self-pay | Admitting: Emergency Medicine

## 2020-01-10 ENCOUNTER — Encounter: Payer: Self-pay | Admitting: Physician Assistant

## 2020-01-10 ENCOUNTER — Ambulatory Visit (INDEPENDENT_AMBULATORY_CARE_PROVIDER_SITE_OTHER): Payer: 59 | Admitting: Physician Assistant

## 2020-01-10 ENCOUNTER — Other Ambulatory Visit: Payer: Self-pay

## 2020-01-10 VITALS — BP 128/64 | HR 66 | Temp 98.7°F | Resp 16 | Ht 62.0 in | Wt 134.0 lb

## 2020-01-10 DIAGNOSIS — J4521 Mild intermittent asthma with (acute) exacerbation: Secondary | ICD-10-CM

## 2020-01-10 MED ORDER — MONTELUKAST SODIUM 10 MG PO TABS
10.0000 mg | ORAL_TABLET | Freq: Every day | ORAL | 3 refills | Status: DC
Start: 2020-01-10 — End: 2020-03-08

## 2020-01-10 MED ORDER — BENZONATATE 100 MG PO CAPS
100.0000 mg | ORAL_CAPSULE | Freq: Two times a day (BID) | ORAL | 0 refills | Status: DC | PRN
Start: 2020-01-10 — End: 2020-02-18

## 2020-01-10 MED ORDER — BUDESONIDE 180 MCG/ACT IN AEPB: 2.0000 | INHALATION_SPRAY | Freq: Two times a day (BID) | RESPIRATORY_TRACT | 2 refills | Status: DC
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
## 2020-01-10 NOTE — Patient Instructions (Signed)
Please keep well-hydrated and get plenty of rest. Start the Pulmicort -- 2 puffs twice daily. Once this flare is calmed down, then decrease to 1 puff twice daily. Take the singulair as directed in addition to your other allergy medications.  The tessalon is to help with cough.  Please let me know if symptoms are not resolving.

## 2020-01-10 NOTE — Telephone Encounter (Signed)
[facility] TELEPHONE ADVICE RECORD AccessNurse Patient Name: Sonia Kim Gender: Female DOB: [birth year] Age: 64 Y 5 M 22 D Return Phone Number: [phone] (Primary), [phone] (Secondary) Address[place] [address] Client [facility] [place] Client Site [facility] Day Physician Lezlie Octave- MD Contact Type Call Who Is Calling Patient / Member / Family / Caregiver Call Type Triage / Clinical Relationship To Patient Self Return Phone Number [phone] (Secondary) Chief Complaint ASTHMA ATTACK Reason for Call Symptomatic / Request for Health Information Initial Comment [facility]er [facility] [place] and her albuterol has zero refills, she was on prednisone and things are getting worse. She hardly has an medication left. Translation No Nurse Assessment Nurse: [place], RN, [name] Date/Time (Eastern Time): 01/09/2020 11:36:01 AM Confirm and document reason for call. If symptomatic, describe symptoms. ---Caller states she was on prednisone a couple of weeks ago for asthma flare ups. Typically has this problem this time of year. Trying to get albuterol refilled. Symptoms were better on the prednisone and for 2 days after - progressively have gotten worse over the last week and a half. Coughing, tightness in chest - worse at night. No fever no other symptoms. Has the patient had close contact with a person known or suspected to have the novel coronavirus illness OR traveled / lives in area with major community spread (including international travel) in the last 14 days from the onset of symptoms? * If Asymptomatic, screen for exposure and travel within the last 14 days. ---No Does the patient have any new or worsening symptoms? ---Yes Will a triage be completed? ---Yes Related visit to physician within the last 2 weeks? ---No Does the PT  have any chronic conditions? (i.e. diabetes, asthma, this includes High risk factors for pregnancy, etc.) ---Yes List chronic conditions. ---asthma Is this a behavioral health or substance abuse call? ---No Nurse: York, RN, [name] Date/Time (Eastern Time): 01/09/2020 11:43:18 AM Please select the assessment type ---Refill Does the patient have enough medication to last until the office opens? ---Unable to obtain loaner dose from PharmacyPLEASE NOTE: All timestamps contained within this report are represented as [place] Standard Time. CONFIDENTIALTY NOTICE: This fax transmission is intended only for the addressee. It contains information that is legally privileged, confidential or otherwise protected from use or disclosure. If you are not the intended recipient, you are strictly prohibited from reviewing, disclosing, copying using or disseminating any of this information or taking any action in reliance on or regarding this information. If you have received this fax in error, please notify us immediately by telephone so that we can arrange for its return to [place]. Phone: [phone], Toll-Free: [phone], Fax: [phone] Page: 2 of 3 Call Id: [id number] Nurse Assessment Does the client directives allow for assistance with medications after hours? ---Yes Was the medication filled within the last 6 months? ---No Additional Documentation [facility] [place] [phone] Guidelines Guideline Title Affirmed Question Affirmed Notes Nurse Date/Time [name] Time) Asthma Attack [1] Severe wheezing or coughing AND [2] doesn't have neb or inhaler available York, RN, [name] 01/09/2020 11:39:18 AM Disp. Time [name] Time) Disposition Final User 01/09/2020 11:34:44 AM Send to Urgent [name], [name] 01/09/2020 11:46:39 AM Paged On Call back to [facility], RN, [name] 01/09/2020 11:42:01 AM Go to ED Now Yes York, RN, [name] Disagree/Comply Disagree Caller Understands Yes PreDisposition  Call Doctor Care Advice Given Per Guideline GO TO ED  NOW: * You need to be seen in the Emergency Department. * Go to the ED at ___________ Hospital. CARE ADVICE given per Asthma Attack (Adult) guideline. Comments User: [name], RN Date/Time [name] Time): 01/09/2020 12:09:55 PM advised that MD would call in a refill for her inhaler - and if her symptoms get worse while she is waiting for that - MD advises at least going to UC for neb, and also advised to f/u with PCP regarding continued symptoms. verbalized understanding. Referrals GO TO FACILITY REFUSED Paging [facility] Phone DateTime Result/Outcome Message Type Notes [name] [phone] 01/09/2020 11:46:39 AM Called On Call Provider - Left Message Doctor Paged [name] 01/09/2020 12:02:11 PM Spoke with On Call - General Message Result will get into her chart and send a refill as soon as she can but could be 10 minutes or longer. Suggests if caller's symptoms get worse to go to at least an urgent care where they can get her a neb treatment and inhaler   Spoke with patient today. She denies any upper respiratory symptoms. She has allergies and asthma. Humidity makes her asthma worst. She is just having cough, chest tightness. Using inhaler every 3 hrs instead of every 6 hrs. No nasal congestion. Denies any fever or chills.  Patient has appointment today at 11:30a with [name], PA-C.

## 2020-01-24 ENCOUNTER — Other Ambulatory Visit: Payer: Self-pay | Admitting: Obstetrics & Gynecology

## 2020-01-24 NOTE — Telephone Encounter (Signed)
Medication refill request: Estradiol Last AEX:  08/23/19 DL Next AEX: 08/27/21 SM Last MMG (if hormonal medication request): 11/12/19 BIRADS 1 negative/density b Refill authorized: Today, please advise

## 2020-01-24 NOTE — Telephone Encounter (Signed)
Patient request a refill of her estradiol. [facility] mail [facility] pharmacy.

## 2020-01-25 ENCOUNTER — Telehealth: Payer: Self-pay | Admitting: Family Medicine

## 2020-01-25 MED ORDER — MELOXICAM 15 MG PO TABS: 15.0000 mg | ORAL_TABLET | Freq: Every day | ORAL | 1 refills | Status: DC

## 2020-01-25 MED ORDER — ESTRADIOL 0.5 MG PO TABS: 0.2500 mg | ORAL_TABLET | Freq: Every day | ORAL | 3 refills | Status: DC

## 2020-01-25 NOTE — Telephone Encounter (Signed)
Patient needs her miloxicam refilled - Please send to [facility] in [place].  Last appt:  01/10/2020  Next appt. 04/03/2020

## 2020-01-25 NOTE — Telephone Encounter (Signed)
Medication filled to pharmacy as requested.   

## 2020-01-30 ENCOUNTER — Other Ambulatory Visit: Payer: Self-pay | Admitting: Family Medicine

## 2020-02-09 ENCOUNTER — Other Ambulatory Visit: Payer: Self-pay | Admitting: Family Medicine

## 2020-02-09 NOTE — Telephone Encounter (Signed)
Called to verify pt got Rx sent 01/10/2020

## 2020-02-18 ENCOUNTER — Other Ambulatory Visit: Payer: Self-pay

## 2020-02-18 ENCOUNTER — Ambulatory Visit (INDEPENDENT_AMBULATORY_CARE_PROVIDER_SITE_OTHER): Payer: 59 | Admitting: Family Medicine

## 2020-02-18 ENCOUNTER — Encounter: Payer: Self-pay | Admitting: Family Medicine

## 2020-02-18 VITALS — BP 122/68 | HR 76 | Temp 97.9°F | Resp 17 | Ht 62.0 in | Wt 134.0 lb

## 2020-02-18 DIAGNOSIS — M940 Chondrocostal junction syndrome [Tietze]: Secondary | ICD-10-CM

## 2020-02-18 DIAGNOSIS — Z23 Encounter for immunization: Secondary | ICD-10-CM

## 2020-02-18 MED ORDER — CYCLOBENZAPRINE HCL 10 MG PO TABS: 10.0000 mg | ORAL_TABLET | Freq: Every day | ORAL | 0 refills | Status: DC

## 2020-02-18 MED ORDER — PREDNISONE 10 MG PO TABS: ORAL_TABLET | ORAL | 0 refills | Status: DC

## 2020-02-18 NOTE — Progress Notes (Signed)
   Subjective:    Patient ID: Sonia Kim, female    DOB: February 27, 1956, 64 y.o.   MRN: [id number]  HPI Rib pain- R sided, started ~1.5 weeks ago.  At first wondered if she 'pulled something'.  No known injury.  A lot of stress currently.  Starts at lower back and radiates around to front.  Pt reports sxs are better today b/c she was able to sleep all night.  'it comes and goes'.  'I feel bloated'.  No N/V.  No improvement w/ eating, not particularly worse either.  Heating pad improves sxs.  Movement worsens sxs.  No improvement w/ tylenol/ibuprofen/meloxicam.  No dysuria, no change in frequency/urgency.     Review of Systems For ROS see HPI   This visit occurred during the SARS-CoV-2 public health emergency.  Safety protocols were in place, including screening questions prior to the visit, additional usage of staff PPE, and extensive cleaning of exam room while observing appropriate contact time as indicated for disinfecting solutions.       Objective:   Physical Exam Vitals reviewed.  Constitutional:      General: She is not in acute distress.    Appearance: She is well-developed. She is not ill-appearing.  HENT:     Head: Normocephalic and atraumatic.  Chest:     Chest wall: Tenderness (TTP over R lower rib that radiates from back to front) present. No mass, deformity or crepitus.  Abdominal:     Palpations: There is no mass.     Tenderness: There is no abdominal tenderness. There is no guarding or rebound.  Skin:    General: Skin is warm and dry.     Findings: No rash.  Neurological:     General: No focal deficit present.     Mental Status: She is alert and oriented to person, place, and time.  Psychiatric:        Mood and Affect: Mood normal.        Behavior: Behavior normal.           Assessment & Plan:  Costochondritis- new.  Pt's distribution of pain follows R lower rib.  No palpable abnormality and worse w/ movement- indicating musculoskeletal etiology.   Start Prednisone taper to improve inflammation and muscle relaxer to help w/ spasm.  Reviewed supportive care and red flags that should prompt return.  Pt expressed understanding and is in agreement w/ plan.

## 2020-02-18 NOTE — Patient Instructions (Signed)
Follow up as needed or as scheduled START the Prednisone as directed- take w/ food USE the Cyclobenzaprine nightly as needed for pain/spasm CONTINUE to heat for pain relief Do some gently stretching to avoid stiffness Call with any questions or concerns Hang in there!

## 2020-03-03 ENCOUNTER — Encounter: Payer: Self-pay | Admitting: Family Medicine

## 2020-03-06 ENCOUNTER — Other Ambulatory Visit: Payer: Self-pay | Admitting: General Practice

## 2020-03-06 MED ORDER — ALBUTEROL SULFATE HFA 108 (90 BASE) MCG/ACT IN AERS: INHALATION_SPRAY | RESPIRATORY_TRACT | 2 refills | Status: DC

## 2020-03-08 ENCOUNTER — Other Ambulatory Visit: Payer: Self-pay | Admitting: General Practice

## 2020-03-08 MED ORDER — MONTELUKAST SODIUM 10 MG PO TABS
10.0000 mg | ORAL_TABLET | Freq: Every day | ORAL | 1 refills | Status: DC
Start: 2020-03-08 — End: 2020-07-31

## 2020-03-14 ENCOUNTER — Encounter: Payer: Self-pay | Admitting: Family Medicine

## 2020-03-14 ENCOUNTER — Other Ambulatory Visit: Payer: Self-pay

## 2020-03-14 ENCOUNTER — Telehealth (INDEPENDENT_AMBULATORY_CARE_PROVIDER_SITE_OTHER): Payer: 59 | Admitting: Family Medicine

## 2020-03-14 VITALS — BP 135/68 | Ht 62.0 in | Wt 135.0 lb

## 2020-03-14 DIAGNOSIS — J42 Unspecified chronic bronchitis: Secondary | ICD-10-CM | POA: Diagnosis not present

## 2020-03-14 DIAGNOSIS — J209 Acute bronchitis, unspecified: Secondary | ICD-10-CM

## 2020-03-14 MED ORDER — AZITHROMYCIN 250 MG PO TABS: ORAL_TABLET | ORAL | 0 refills | Status: DC

## 2020-03-14 NOTE — Progress Notes (Signed)
Virtual Visit via Video   I connected with patient on 03/14/20 at 11:00 AM EDT by a video enabled telemedicine application and verified that I am speaking with the correct person using two identifiers.  Location patient: Home Location provider: [facility], Office Persons participating in the virtual visit: Patient, Provider, CMA (Jess B)  I discussed the limitations of evaluation and management by telemedicine and the availability of in person appointments. The patient expressed understanding and agreed to proceed.  Subjective:   HPI:   Cough- 'I have that upper respiratory garbage again'.  sxs started ~2 weeks ago.  No known sick contacts.  + productive cough- yellow sputum, 'nasty taste in my mouth'.  L sided sinus pressure w/ eye watering.  No headache.  No body aches or chills.  No N/V/D.  Pt is fully vaccinated.  + SOB w/ wheezing- particularly at night.  Pt reports this feels like previous chronic bronchitis issues.  Not currently using her Pulmicort.  ROS:   See pertinent positives and negatives per HPI.  Patient Active Problem List   Diagnosis Date Noted  . HTN (hypertension) 11/19/2018  . Shingles 04/25/2017  . Allergic rhinitis 04/10/2017  . Physical exam 02/14/2017  . Hyperlipidemia 08/14/2016  . Anxiety 08/14/2016  . Nonallopathic lesion of cervical region 06/06/2016  . Nonallopathic lesion of thoracic region 06/06/2016  . Nonallopathic lesion of lumbosacral region 06/06/2016  . Nonallopathic lesion of sacral region 06/06/2016  . Polyarthralgia 05/09/2016  . Strain of left piriformis muscle 04/09/2016  . Heart palpitations 08/31/2015  . Heart murmur 08/31/2015  . Greater trochanteric bursitis of both hips 04/05/2015  . Postmenopausal hormone replacement therapy 01/16/2015  . Hypothyroid 01/16/2015  . Vitamin D deficiency 01/16/2015    Social History   Tobacco Use  . Smoking status: Former Smoker    Packs/day: 0.50    Years: 25.00    Pack years:  12.50    Quit date: 02/25/2005    Years since quitting: 15.0  . Smokeless tobacco: Never Used  Substance Use Topics  . Alcohol use: Yes    Alcohol/week: 5.0 standard drinks    Types: 5 Glasses of wine per week    Current Outpatient Medications:  .  albuterol (VENTOLIN HFA) 108 (90 Base) MCG/ACT inhaler, INHALE 2 PUFFS BY MOUTH EVERY 6 HOURS AS NEEDED FOR WHEEZE OR SHORTNESS OF BREATH, Disp: 18 g, Rfl: 2 .  ALPRAZolam (XANAX) 0.5 MG tablet, Take 1 tablet (0.5 mg total) by mouth 3 (three) times daily as needed for anxiety., Disp: 30 tablet, Rfl: 1 .  Biotin 1 MG CAPS, Take by mouth., Disp: , Rfl:  .  calcium carbonate (TUMS - DOSED IN MG ELEMENTAL CALCIUM) 500 MG chewable tablet, Chew 1 tablet by mouth 3 (three) times daily as needed for indigestion or heartburn., Disp: , Rfl:  .  cetirizine (ZYRTEC) 10 MG tablet, Take 1 tablet (10 mg total) by mouth daily., Disp: 30 tablet, Rfl: 11 .  estradiol (ESTRACE) 0.5 MG tablet, Take 0.5 tablets (0.25 mg total) by mouth daily., Disp: 45 tablet, Rfl: 3 .  fluticasone (FLONASE) 50 MCG/ACT nasal spray, USE 2 SPRAYS IN EACH  NOSTRIL DAILY, Disp: 48 g, Rfl: 1 .  hydrochlorothiazide (HYDRODIURIL) 12.5 MG tablet, Take 1 tablet (12.5 mg total) by mouth daily., Disp: 90 tablet, Rfl: 1 .  levothyroxine (SYNTHROID) 50 MCG tablet, TAKE 1 TABLET BY MOUTH  DAILY BEFORE BREAKFAST, Disp: 90 tablet, Rfl: 3 .  medroxyPROGESTERone (PROVERA) 2.5 MG tablet, Take 1  tablet (2.5 mg total) by mouth daily., Disp: 90 tablet, Rfl: 3 .  montelukast (SINGULAIR) 10 MG tablet, Take 1 tablet (10 mg total) by mouth at bedtime., Disp: 90 tablet, Rfl: 1 .  rosuvastatin (CRESTOR) 5 MG tablet, TAKE 1 TABLET BY MOUTH  DAILY, Disp: 90 tablet, Rfl: 3 .  sertraline (ZOLOFT) 25 MG tablet, TAKE 1 TABLET BY MOUTH  DAILY, Disp: 90 tablet, Rfl: 3 .  VITAMIN D PO, Take 2,000 Int'l Units by mouth., Disp: , Rfl:  .  budesonide (PULMICORT) 180 MCG/ACT inhaler, Inhale 2 puffs into the lungs in the  morning and at bedtime. (Patient not taking: Reported on 03/14/2020), Disp: 1 each, Rfl: 2  Allergies  Allergen Reactions  . Lisinopril Cough  . Oxycodone-Acetaminophen Nausea Only    Objective:   BP 135/68   Ht 5\' 2"  (1.575 m)   Wt 135 lb (61.2 kg)   LMP 03/10/2010   BMI 24.69 kg/m   AAOx3, NAD NCAT, EOMI No obvious CN deficits Coloring WNL Pt is able to speak clearly, coherently without shortness of breath or increased work of breathing.  + wet cough Thought process is linear.  Mood is appropriate.   Assessment and Plan:   Chronic bronchitis w/ acute exacerbation- pt has hx of this.  Sxs feel similar to her previous infections (which are often weather related).  She is not using her Pulmicort at this time.  Told her she needs to restart- 2 puffs twice daily- and continue albuterol as needed.  Will start Zpack as pt has done well with this in the past.  Start Mucinex DM for cough/congestion.  Did discuss possibility of COVID if sxs fail to improve or change/worsen.  Reviewed supportive care and red flags that should prompt return.  Pt expressed understanding and is in agreement w/ plan.    05/10/2010, MD 03/14/2020

## 2020-03-14 NOTE — Progress Notes (Signed)
I have discussed the procedure for the virtual visit with the patient who has given consent to proceed with assessment and treatment.   Herb Beltre L Italo Banton, CMA     

## 2020-03-15 ENCOUNTER — Encounter: Payer: Self-pay | Admitting: Family Medicine

## 2020-03-15 DIAGNOSIS — F4323 Adjustment disorder with mixed anxiety and depressed mood: Secondary | ICD-10-CM

## 2020-03-16 ENCOUNTER — Other Ambulatory Visit: Payer: Self-pay | Admitting: Family Medicine

## 2020-04-03 ENCOUNTER — Encounter: Payer: Self-pay | Admitting: Family Medicine

## 2020-04-03 ENCOUNTER — Other Ambulatory Visit: Payer: Self-pay

## 2020-04-03 ENCOUNTER — Other Ambulatory Visit: Payer: Self-pay | Admitting: General Practice

## 2020-04-03 ENCOUNTER — Other Ambulatory Visit: Payer: Self-pay | Admitting: Family Medicine

## 2020-04-03 ENCOUNTER — Ambulatory Visit (INDEPENDENT_AMBULATORY_CARE_PROVIDER_SITE_OTHER): Payer: 59 | Admitting: Family Medicine

## 2020-04-03 VITALS — BP 126/80 | HR 66 | Temp 98.2°F | Resp 17 | Ht 63.0 in | Wt 134.8 lb

## 2020-04-03 DIAGNOSIS — Z23 Encounter for immunization: Secondary | ICD-10-CM

## 2020-04-03 DIAGNOSIS — Z780 Asymptomatic menopausal state: Secondary | ICD-10-CM

## 2020-04-03 DIAGNOSIS — E559 Vitamin D deficiency, unspecified: Secondary | ICD-10-CM

## 2020-04-03 DIAGNOSIS — Z0000 Encounter for general adult medical examination without abnormal findings: Secondary | ICD-10-CM

## 2020-04-03 DIAGNOSIS — I10 Essential (primary) hypertension: Secondary | ICD-10-CM

## 2020-04-03 LAB — CBC WITH DIFFERENTIAL/PLATELET
Basophils Absolute: 0.1 10*3/uL (ref 0.0–0.1)
Basophils Relative: 1.1 % (ref 0.0–3.0)
Eosinophils Absolute: 0.4 10*3/uL (ref 0.0–0.7)
Eosinophils Relative: 6.3 % — ABNORMAL HIGH (ref 0.0–5.0)
HCT: 40.5 % (ref 36.0–46.0)
Hemoglobin: 13.9 g/dL (ref 12.0–15.0)
Lymphocytes Relative: 21.5 % (ref 12.0–46.0)
Lymphs Abs: 1.3 10*3/uL (ref 0.7–4.0)
MCHC: 34.4 g/dL (ref 30.0–36.0)
MCV: 93 fl (ref 78.0–100.0)
Monocytes Absolute: 0.4 10*3/uL (ref 0.1–1.0)
Monocytes Relative: 7.2 % (ref 3.0–12.0)
Neutro Abs: 3.9 10*3/uL (ref 1.4–7.7)
Neutrophils Relative %: 63.9 % (ref 43.0–77.0)
Platelets: 203 10*3/uL (ref 150.0–400.0)
RBC: 4.35 Mil/uL (ref 3.87–5.11)
RDW: 12.6 % (ref 11.5–15.5)
WBC: 6.1 10*3/uL (ref 4.0–10.5)

## 2020-04-03 LAB — BASIC METABOLIC PANEL
BUN: 16 mg/dL (ref 6–23)
CO2: 30 mEq/L (ref 19–32)
Calcium: 9.1 mg/dL (ref 8.4–10.5)
Chloride: 105 mEq/L (ref 96–112)
Creatinine, Ser: 0.69 mg/dL (ref 0.40–1.20)
GFR: 92.18 mL/min (ref >60.00)
Glucose, Bld: 95 mg/dL (ref 70–99)
Potassium: 4.1 mEq/L (ref 3.5–5.1)
Sodium: 143 mEq/L (ref 135–145)

## 2020-04-03 LAB — HEPATIC FUNCTION PANEL
ALT: 9 U/L (ref 0–35)
AST: 17 U/L (ref 0–37)
Albumin: 4.2 g/dL (ref 3.5–5.2)
Alkaline Phosphatase: 48 U/L (ref 39–117)
Bilirubin, Direct: 0.1 mg/dL (ref 0.0–0.3)
Total Bilirubin: 0.5 mg/dL (ref 0.2–1.2)
Total Protein: 5.8 g/dL — ABNORMAL LOW (ref 6.0–8.3)

## 2020-04-03 LAB — VITAMIN D 25 HYDROXY (VIT D DEFICIENCY, FRACTURES): VITD: 26.69 ng/mL — ABNORMAL LOW (ref 30.00–100.00)

## 2020-04-03 LAB — LIPID PANEL
Cholesterol: 170 mg/dL (ref 0–200)
HDL: 49.8 mg/dL (ref >39.00)
LDL Cholesterol: 101 mg/dL — ABNORMAL HIGH (ref 0–99)
NonHDL: 120.44
Total CHOL/HDL Ratio: 3
Triglycerides: 95 mg/dL (ref 0.0–149.0)
VLDL: 19 mg/dL (ref 0.0–40.0)

## 2020-04-03 LAB — TSH: TSH: 3.07 u[IU]/mL (ref 0.35–4.50)

## 2020-04-03 MED ORDER — VITAMIN D (ERGOCALCIFEROL) 1.25 MG (50000 UNIT) PO CAPS: 50000.0000 [IU] | ORAL_CAPSULE | ORAL | 0 refills | Status: DC

## 2020-04-03 NOTE — Addendum Note (Signed)
Addended by: Lana Fish on: 04/03/2020 08:47 AM   Modules accepted: Orders

## 2020-04-03 NOTE — Progress Notes (Signed)
   Subjective:    Patient ID: Sonia Kim, female    DOB: [birth year], 64 y.o.   MRN: [id number]  HPI CPE- UTD on colonoscopy, mammo, pap, flu, Tdap, COVID.  Due for DEXA.  Getting Shingrix today.  Reviewed past medical, surgical, family and social histories.   Patient Care Team    Relationship Specialty Notifications Start End  Sheliah Hatch, MD PCP - General Family Medicine  08/14/16   Judi Saa, DO Consulting Physician Sports Medicine  06/19/16   Quintella Reichert, MD Consulting Physician Cardiology  06/19/16   Charna Elizabeth, MD Consulting Physician Gastroenterology  08/14/16   Ria Comment, FNP Nurse Practitioner Family Medicine  08/14/16     Health Maintenance  Topic Date Due  . HIV Screening  02/17/2021 (Originally 07/21/1971)  . MAMMOGRAM  11/11/2020  . PAP SMEAR-Modifier  04/09/2021  . TETANUS/TDAP  09/12/2023  . COLONOSCOPY  02/16/2026  . INFLUENZA VACCINE  Completed  . COVID-19 Vaccine  Completed  . Hepatitis C Screening  Completed      Review of Systems Patient reports no vision/ hearing changes, adenopathy,fever, weight change,  persistant/recurrent hoarseness , swallowing issues, chest pain, palpitations, edema, persistant/recurrent cough, hemoptysis, dyspnea (rest/exertional/paroxysmal nocturnal), gastrointestinal bleeding (melena, rectal bleeding), abdominal pain, significant heartburn, bowel changes, GU symptoms (dysuria, hematuria, incontinence), Gyn symptoms (abnormal  bleeding, pain),  syncope, focal weakness, memory loss, numbness & tingling, skin/hair/nail changes, abnormal bruising or bleeding, anxiety, or depression.   This visit occurred during the SARS-CoV-2 public health emergency.  Safety protocols were in place, including screening questions prior to the visit, additional usage of staff PPE, and extensive cleaning of exam room while observing appropriate contact time as indicated for disinfecting solutions.       Objective:   Physical  Exam General Appearance:    Alert, cooperative, no distress, appears stated age  Head:    Normocephalic, without obvious abnormality, atraumatic  Eyes:    PERRL, conjunctiva/corneas clear, EOM's intact, fundi    benign, both eyes  Ears:    Normal TM's and external ear canals, both ears  Nose:   Deferred due to COVID  Throat:   Neck:   Supple, symmetrical, trachea midline, no adenopathy;    Thyroid: no enlargement/tenderness/nodules  Back:     Symmetric, no curvature, ROM normal, no CVA tenderness  Lungs:     Clear to auscultation bilaterally, respirations unlabored  Chest Wall:    No tenderness or deformity   Heart:    Regular rate and rhythm, S1 and S2 normal, no murmur, rub   or gallop  Breast Exam:    Deferred to GYN  Abdomen:     Soft, non-tender, bowel sounds active all four quadrants,    no masses, no organomegaly  Genitalia:    Deferred to GYN  Rectal:    Extremities:   Extremities normal, atraumatic, no cyanosis or edema  Pulses:   2+ and symmetric all extremities  Skin:   Skin color, texture, turgor normal, no rashes or lesions  Lymph nodes:   Cervical, supraclavicular, and axillary nodes normal  Neurologic:   CNII-XII intact, normal strength, sensation and reflexes    throughout          Assessment & Plan:

## 2020-04-03 NOTE — Assessment & Plan Note (Signed)
Chronic problem.  Adequate control.  Asymptomatic.  Check labs.  No anticipated med changes.  Will follow. 

## 2020-04-03 NOTE — Patient Instructions (Signed)
Follow up in 6 months to recheck BP and cholesterol We'll notify you of your lab results and make any changes if needed Keep up the good work!  You look great! Think about whether you would like a pulmonary referral to check out this asthma/bronchitis situation Call with any questions or concerns Stay Safe!  Stay Healthy!!

## 2020-04-03 NOTE — Assessment & Plan Note (Signed)
Pt's PE WNL.  UTD on colonoscopy, mammo, pap, Tdap, Flu, COVID.  Shingrix given today.  Check labs.  Anticipatory guidance provided.

## 2020-04-07 ENCOUNTER — Other Ambulatory Visit: Payer: Self-pay | Admitting: Physician Assistant

## 2020-04-07 NOTE — Telephone Encounter (Signed)
Will defer further refills of patient's medications to PCP  

## 2020-04-12 ENCOUNTER — Other Ambulatory Visit: Payer: Self-pay | Admitting: General Practice

## 2020-04-12 MED ORDER — ALBUTEROL SULFATE HFA 108 (90 BASE) MCG/ACT IN AERS: INHALATION_SPRAY | RESPIRATORY_TRACT | 2 refills | Status: DC

## 2020-04-13 ENCOUNTER — Other Ambulatory Visit: Payer: Self-pay

## 2020-04-13 DIAGNOSIS — F4323 Adjustment disorder with mixed anxiety and depressed mood: Secondary | ICD-10-CM

## 2020-04-17 ENCOUNTER — Encounter: Payer: Self-pay | Admitting: Physician Assistant

## 2020-04-18 ENCOUNTER — Encounter: Payer: Self-pay | Admitting: Physician Assistant

## 2020-04-20 ENCOUNTER — Encounter: Payer: Self-pay | Admitting: Physician Assistant

## 2020-04-26 ENCOUNTER — Emergency Department
Admission: EM | Admit: 2020-04-26 | Discharge: 2020-04-26 | Disposition: A | Discharge status: Home / Self Care | Payer: 59 | Source: Home / Self Care

## 2020-04-26 ENCOUNTER — Other Ambulatory Visit: Payer: Self-pay

## 2020-04-26 NOTE — ED Notes (Signed)
Pt is choosing to leave due to extended wait time to be evaluated by provider patient discharged ambulatory with steady gait.

## 2020-04-26 NOTE — ED Triage Notes (Signed)
Pt presents with abrasions on face and nose, fell yesterday chasing after the dogs hit face. Patient awaits hip replacement and had difficulty running normally.

## 2020-04-27 ENCOUNTER — Ambulatory Visit (INDEPENDENT_AMBULATORY_CARE_PROVIDER_SITE_OTHER): Payer: 59 | Admitting: Physician Assistant

## 2020-04-27 ENCOUNTER — Encounter: Payer: Self-pay | Admitting: Physician Assistant

## 2020-04-27 VITALS — BP 118/60 | HR 70 | Temp 97.3°F | Resp 15 | Ht 63.0 in | Wt 137.0 lb

## 2020-04-27 DIAGNOSIS — S0993XA Unspecified injury of face, initial encounter: Secondary | ICD-10-CM | POA: Diagnosis not present

## 2020-04-27 NOTE — Patient Instructions (Addendum)
Please keep well-hydrated. Avoid any strenuous activity while you are recuperating from recent injury.  Your neurological exam is normal today. Good movement of eyes.  No indication for x-ray today as would not change management.  Thankfully we are outside the window for worrisome things.   Can apply warm and cold compresses to the area to help with soreness and mild residual swelling.  Tylenol or motrin for pain.    If you develop new onset headache or worsening soreness, or if you note any new symptoms, let us know ASAP.   If you have any confusion, vision changes, etc -- you need ER evaluation.  Do not expect this to be an issue but at least had to review with you.

## 2020-04-27 NOTE — Progress Notes (Signed)
Patient presents to clinic today c/o fall two days ago at home resulting in injury to her nose and face. Notes she was walking outside and missed a step, falling forward onto the concrete, hitting her face. No LOC. Was able to get up and go about her day. Noted soreness of the face with swelling over her nose. Has improved since yesterday. Notes some bruising under L eye. Denies eye pain, decreased EOMI, light sensitivity. Denies headache, vision changes, hearing changes, dental pain. Denies epistaxis. Is sleeping well. Has taken Motrin this morning for pain.   Past Medical History:  Diagnosis Date  . Allergy    Dogs, dust, and mold  . Anxiety   . Arthritis   . Benign essential HTN 08/31/2015  . Chronic neck pain   . Colon polyps   . Depression   . Heart murmur   . Heart murmur   . Hypercholesteremia   . Hyperlipidemia 05/2005  . Hypothyroid 06/2009  . Osteoarthritis of thoracic spine   . Shingles 06/2011    Current Outpatient Medications on File Prior to Visit  Medication Sig Dispense Refill  . albuterol (VENTOLIN HFA) 108 (90 Base) MCG/ACT inhaler INHALE 2 PUFFS BY MOUTH EVERY 6 HOURS AS NEEDED FOR WHEEZE OR SHORTNESS OF BREATH 18 g 2  . estradiol (ESTRACE) 0.5 MG tablet Take 0.5 tablets (0.25 mg total) by mouth daily. 45 tablet 3  . fluticasone (FLONASE) 50 MCG/ACT nasal spray USE 2 SPRAYS IN EACH  NOSTRIL DAILY 48 g 1  . hydrochlorothiazide (HYDRODIURIL) 12.5 MG tablet TAKE 1 TABLET BY MOUTH  DAILY 90 tablet 3  . levothyroxine (SYNTHROID) 50 MCG tablet TAKE 1 TABLET BY MOUTH  DAILY BEFORE BREAKFAST 90 tablet 3  . medroxyPROGESTERone (PROVERA) 2.5 MG tablet Take 1 tablet (2.5 mg total) by mouth daily. 90 tablet 3  . meloxicam (MOBIC) 15 MG tablet TAKE 1 TABLET BY MOUTH EVERY DAY 30 tablet 1  . montelukast (SINGULAIR) 10 MG tablet Take 1 tablet (10 mg total) by mouth at bedtime. 90 tablet 1  . PULMICORT FLEXHALER 180 MCG/ACT inhaler INHALE 2 PUFFS INTO THE LUNGS IN THE MORNING AND  AT BEDTIME. 1 each 2  . rosuvastatin (CRESTOR) 5 MG tablet TAKE 1 TABLET BY MOUTH  DAILY 90 tablet 3  . sertraline (ZOLOFT) 25 MG tablet TAKE 1 TABLET BY MOUTH  DAILY 90 tablet 3  . VITAMIN D PO Take 2,000 Int'l Units by mouth.    . Biotin 1 MG CAPS Take by mouth. (Patient not taking: Reported on 04/03/2020)    . Vitamin D, Ergocalciferol, (DRISDOL) 1.25 MG (50000 UNIT) CAPS capsule Take 1 capsule (50,000 Units total) by mouth every 7 (seven) days. (Patient not taking: Reported on 04/27/2020) 12 capsule 0   No current facility-administered medications on file prior to visit.    Allergies  Allergen Reactions  . Lisinopril Cough  . Oxycodone-Acetaminophen Nausea Only    Family History  Problem Relation Age of Onset  . Thyroid disease Mother   . Lung cancer Mother   . CVA Father   . Diabetes Father   . Hyperlipidemia Father   . Hashimoto's thyroiditis Sister   . CVA Paternal Grandfather   . CVA Paternal Grandmother   . Breast cancer Neg Hx     Social History   Socioeconomic History  . Marital status: Married    Spouse name: Not on file  . Number of children: 3  . Years of education: Not on file  .  Highest education level: Not on file  Occupational History  . Not on file  Tobacco Use  . Smoking status: Former Smoker    Packs/day: 0.50    Years: 25.00    Pack years: 12.50    Quit date: 02/25/2005    Years since quitting: 15.1  . Smokeless tobacco: Never Used  Vaping Use  . Vaping Use: Never used  Substance and Sexual Activity  . Alcohol use: Yes    Alcohol/week: 5.0 standard drinks    Types: 5 Glasses of wine per week  . Drug use: No  . Sexual activity: Not Currently    Partners: Male    Birth control/protection: Surgical    Comment: BTL  Other Topics Concern  . Not on file  Social History Narrative  . Not on file   Social Determinants of Health   Financial Resource Strain:   . Difficulty of Paying Living Expenses: Not on file  Food Insecurity:   .  Worried About Programme researcher, broadcasting/film/video in the Last Year: Not on file  . Ran Out of Food in the Last Year: Not on file  Transportation Needs:   . Lack of Transportation (Medical): Not on file  . Lack of Transportation (Non-Medical): Not on file  Physical Activity:   . Days of Exercise per Week: Not on file  . Minutes of Exercise per Session: Not on file  Stress:   . Feeling of Stress : Not on file  Social Connections:   . Frequency of Communication with Friends and Family: Not on file  . Frequency of Social Gatherings with Friends and Family: Not on file  . Attends Religious Services: Not on file  . Active Member of Clubs or Organizations: Not on file  . Attends Banker Meetings: Not on file  . Marital Status: Not on file    Review of Systems - See HPI.  All other ROS are negative.  LMP 03/10/2010   Physical Exam Vitals reviewed.  Constitutional:      Appearance: Normal appearance.  HENT:     Head: Normocephalic. Abrasion (Just above nasal bridge.  No evidence of infection.) present. No right periorbital erythema, left periorbital erythema or laceration.     Jaw: There is normal jaw occlusion.      Right Ear: Tympanic membrane, ear canal and external ear normal.     Left Ear: Tympanic membrane, ear canal and external ear normal.     Nose: Signs of injury and nasal tenderness present. No nasal deformity, septal deviation, laceration, mucosal edema, congestion or rhinorrhea.     Right Nostril: No epistaxis or septal hematoma.     Left Nostril: No epistaxis or septal hematoma.     Mouth/Throat:     Mouth: Mucous membranes are moist.     Pharynx: Oropharynx is clear. No oropharyngeal exudate or posterior oropharyngeal erythema.  Eyes:     Extraocular Movements: Extraocular movements intact.     Conjunctiva/sclera: Conjunctivae normal.     Pupils: Pupils are equal, round, and reactive to light.  Cardiovascular:     Rate and Rhythm: Normal rate and regular rhythm.      Pulses: Normal pulses.     Heart sounds: Normal heart sounds.  Pulmonary:     Effort: Pulmonary effort is normal.     Breath sounds: Normal breath sounds.  Musculoskeletal:     Cervical back: Neck supple. No rigidity or tenderness.  Neurological:     General: No focal deficit present.  Mental Status: She is alert and oriented to person, place, and time.     GCS: GCS eye subscore is 4. GCS verbal subscore is 5. GCS motor subscore is 6.     Cranial Nerves: Cranial nerves are intact.     Sensory: Sensation is intact.     Coordination: Coordination is intact.     Gait: Gait is intact.  Psychiatric:        Mood and Affect: Mood normal.     Recent Results (from the past 2160 hour(s))  Lipid panel     Status: Abnormal   Collection Time: 04/03/20  8:40 AM  Result Value Ref Range   Cholesterol 170 0 - 200 mg/dL    Comment: ATP III Classification       Desirable:  < 200 mg/dL               Borderline High:  200 - 239 mg/dL          High:  > = 161240 mg/dL   Triglycerides 09.695.0 0 - 149 mg/dL    Comment: Normal:  <045<150 mg/dLBorderline High:  150 - 199 mg/dL   HDL 40.9849.80 >11.91>39.00 mg/dL   VLDL 47.819.0 0.0 - 29.540.0 mg/dL   LDL Cholesterol 621101 (H) 0 - 99 mg/dL   Total CHOL/HDL Ratio 3     Comment:                Men          Women1/2 Average Risk     3.4          3.3Average Risk          5.0          4.42X Average Risk          9.6          7.13X Average Risk          15.0          11.0                       NonHDL 120.44     Comment: NOTE:  Non-HDL goal should be 30 mg/dL higher than patient's LDL goal (i.e. LDL goal of < 70 mg/dL, would have non-HDL goal of < 100 mg/dL)  Basic metabolic panel     Status: None   Collection Time: 04/03/20  8:40 AM  Result Value Ref Range   Sodium 143 135 - 145 mEq/L   Potassium 4.1 3.5 - 5.1 mEq/L   Chloride 105 96 - 112 mEq/L   CO2 30 19 - 32 mEq/L   Glucose, Bld 95 70 - 99 mg/dL   BUN 16 6 - 23 mg/dL   Creatinine, Ser 3.080.69 0.40 - 1.20 mg/dL   GFR 65.7892.18 >46.96>60.00  mL/min   Calcium 9.1 8.4 - 10.5 mg/dL  TSH     Status: None   Collection Time: 04/03/20  8:40 AM  Result Value Ref Range   TSH 3.07 0.35 - 4.50 uIU/mL  Hepatic function panel     Status: Abnormal   Collection Time: 04/03/20  8:40 AM  Result Value Ref Range   Total Bilirubin 0.5 0.2 - 1.2 mg/dL   Bilirubin, Direct 0.1 0.0 - 0.3 mg/dL   Alkaline Phosphatase 48 39 - 117 U/L   AST 17 0 - 37 U/L   ALT 9 0 - 35 U/L   Total Protein 5.8 (L) 6.0 - 8.3 g/dL  Albumin 4.2 3.5 - 5.2 g/dL  CBC with Differential/Platelet     Status: Abnormal   Collection Time: 04/03/20  8:40 AM  Result Value Ref Range   WBC 6.1 4.0 - 10.5 K/uL   RBC 4.35 3.87 - 5.11 Mil/uL   Hemoglobin 13.9 12.0 - 15.0 g/dL   HCT 82.5 36 - 46 %   MCV 93.0 78.0 - 100.0 fl   MCHC 34.4 30.0 - 36.0 g/dL   RDW 00.3 70.4 - 88.8 %   Platelets 203.0 150 - 400 K/uL   Neutrophils Relative % 63.9 43 - 77 %   Lymphocytes Relative 21.5 12 - 46 %   Monocytes Relative 7.2 3 - 12 %   Eosinophils Relative 6.3 (H) 0 - 5 %   Basophils Relative 1.1 0 - 3 %   Neutro Abs 3.9 1.4 - 7.7 K/uL   Lymphs Abs 1.3 0.7 - 4.0 K/uL   Monocytes Absolute 0.4 0.1 - 1.0 K/uL   Eosinophils Absolute 0.4 0.0 - 0.7 K/uL   Basophils Absolute 0.1 0.0 - 0.1 K/uL  VITAMIN D 25 Hydroxy (Vit-D Deficiency, Fractures)     Status: Abnormal   Collection Time: 04/03/20  8:40 AM  Result Value Ref Range   VITD 26.69 (L) 30.00 - 100.00 ng/mL   Assessment/Plan: 1. Facial injury, initial encounter Neurological exam within normal limits.  Pupils equal round and reactive to light and near accommodation.  EOMI.  Mild nasal bridge tenderness with bruising.  No noted septal deviation or gross bony abnormality.  Discussed potential for imaging of facial bones by giving lack of septal deviation, nasal deformity, or severe suborbital tenderness will be unlikely to change treatment plan.  She would like to refrain from having this at this time.  No indication for CT as well greater  than 24 hours out from injury without any evidence of postconcussive syndrome or alarm signs/symptoms.  Supportive measures and OTC medications reviewed with patient.  She is try to take it easy for the next day or so.  Discussed bruising will take a couple weeks to resolve.  Strict return precautions discussed.  Patient patient voiced understanding and agreement with plan.   This visit occurred during the SARS-CoV-2 public health emergency.  Safety protocols were in place, including screening questions prior to the visit, additional usage of staff PPE, and extensive cleaning of exam room while observing appropriate contact time as indicated for disinfecting solutions.     Piedad Climes, PA-C

## 2020-04-28 ENCOUNTER — Telehealth: Payer: Self-pay | Admitting: Family Medicine

## 2020-04-28 MED ORDER — ALBUTEROL SULFATE HFA 108 (90 BASE) MCG/ACT IN AERS
INHALATION_SPRAY | RESPIRATORY_TRACT | 2 refills | Status: DC
Start: 2020-04-28 — End: 2020-10-04

## 2020-04-28 NOTE — Telephone Encounter (Signed)
Medication Refills  Medication:  Ventolin inhaler  Pharmacy:    Please send to [facility] this time  ** Let patient know to contact pharmacy at the end of the day to make sure medication is ready.**  ** Please notify patient to allow 48-72 hours to process.**  ** Encourage patient to contact the pharmacy for refills or they can request refills through [facility]**  Clinical Fills out below:   Last refill:  QTY:  Refill Date:    Other Comments:   Okay for refill?  Please advise.

## 2020-04-28 NOTE — Telephone Encounter (Signed)
Albuterol inhaler sent to local pharmacy for pick up.

## 2020-04-28 NOTE — Telephone Encounter (Signed)
Patient would like her Ventolin inhaler refilled.

## 2020-05-01 ENCOUNTER — Other Ambulatory Visit: Payer: Self-pay

## 2020-05-03 ENCOUNTER — Encounter: Payer: Self-pay | Admitting: Family Medicine

## 2020-05-03 MED ORDER — SERTRALINE HCL 25 MG PO TABS
25.0000 mg | ORAL_TABLET | Freq: Every day | ORAL | 3 refills | Status: DC
Start: 2020-05-03 — End: 2021-02-21

## 2020-05-15 ENCOUNTER — Other Ambulatory Visit: Payer: Self-pay | Admitting: Family Medicine

## 2020-05-21 ENCOUNTER — Encounter: Payer: Self-pay | Admitting: Family Medicine

## 2020-05-25 ENCOUNTER — Other Ambulatory Visit: Payer: Self-pay | Admitting: Family Medicine

## 2020-05-26 ENCOUNTER — Ambulatory Visit: Payer: 59 | Admitting: Family Medicine

## 2020-06-29 ENCOUNTER — Ambulatory Visit: Payer: 59 | Admitting: Obstetrics and Gynecology

## 2020-07-25 ENCOUNTER — Encounter: Payer: Self-pay | Admitting: Physician Assistant

## 2020-07-25 ENCOUNTER — Other Ambulatory Visit: Payer: Self-pay

## 2020-07-25 ENCOUNTER — Ambulatory Visit (INDEPENDENT_AMBULATORY_CARE_PROVIDER_SITE_OTHER): Payer: 59 | Admitting: Physician Assistant

## 2020-07-25 VITALS — BP 120/64 | HR 72 | Temp 98.1°F | Resp 14 | Ht 63.0 in | Wt 136.0 lb

## 2020-07-25 DIAGNOSIS — E559 Vitamin D deficiency, unspecified: Secondary | ICD-10-CM

## 2020-07-25 DIAGNOSIS — I10 Essential (primary) hypertension: Secondary | ICD-10-CM

## 2020-07-25 DIAGNOSIS — J3089 Other allergic rhinitis: Secondary | ICD-10-CM

## 2020-07-25 DIAGNOSIS — R5383 Other fatigue: Secondary | ICD-10-CM | POA: Diagnosis not present

## 2020-07-25 DIAGNOSIS — Z20822 Contact with and (suspected) exposure to covid-19: Secondary | ICD-10-CM

## 2020-07-25 LAB — COMPREHENSIVE METABOLIC PANEL
ALT: 12 U/L (ref 0–35)
AST: 22 U/L (ref 0–37)
Albumin: 4.1 g/dL (ref 3.5–5.2)
Alkaline Phosphatase: 46 U/L (ref 39–117)
BUN: 17 mg/dL (ref 6–23)
CO2: 29 mEq/L (ref 19–32)
Calcium: 9.2 mg/dL (ref 8.4–10.5)
Chloride: 106 mEq/L (ref 96–112)
Creatinine, Ser: 0.7 mg/dL (ref 0.40–1.20)
GFR: 91.66 mL/min (ref >60.00)
Glucose, Bld: 92 mg/dL (ref 70–99)
Potassium: 3.4 mEq/L — ABNORMAL LOW (ref 3.5–5.1)
Sodium: 143 mEq/L (ref 135–145)
Total Bilirubin: 0.4 mg/dL (ref 0.2–1.2)
Total Protein: 6.4 g/dL (ref 6.0–8.3)

## 2020-07-25 LAB — CBC WITH DIFFERENTIAL/PLATELET
Basophils Absolute: 0.1 10*3/uL (ref 0.0–0.1)
Basophils Relative: 1.2 % (ref 0.0–3.0)
Eosinophils Absolute: 0.5 10*3/uL (ref 0.0–0.7)
Eosinophils Relative: 5.7 % — ABNORMAL HIGH (ref 0.0–5.0)
HCT: 37.8 % (ref 36.0–46.0)
Hemoglobin: 12.8 g/dL (ref 12.0–15.0)
Lymphocytes Relative: 22 % (ref 12.0–46.0)
Lymphs Abs: 1.8 10*3/uL (ref 0.7–4.0)
MCHC: 33.8 g/dL (ref 30.0–36.0)
MCV: 94.3 fl (ref 78.0–100.0)
Monocytes Absolute: 0.6 10*3/uL (ref 0.1–1.0)
Monocytes Relative: 7.1 % (ref 3.0–12.0)
Neutro Abs: 5.1 10*3/uL (ref 1.4–7.7)
Neutrophils Relative %: 64 % (ref 43.0–77.0)
Platelets: 226 10*3/uL (ref 150.0–400.0)
RBC: 4.01 Mil/uL (ref 3.87–5.11)
RDW: 13.4 % (ref 11.5–15.5)
WBC: 8 10*3/uL (ref 4.0–10.5)

## 2020-07-25 LAB — TSH: TSH: 2.77 u[IU]/mL (ref 0.35–4.50)

## 2020-07-25 LAB — VITAMIN D 25 HYDROXY (VIT D DEFICIENCY, FRACTURES): VITD: 42.45 ng/mL (ref 30.00–100.00)

## 2020-07-25 NOTE — Progress Notes (Signed)
Acute Office Visit  Subjective:    Patient ID: Kelse Ploch, female    DOB: 03/31/1956, 65 y.o.   MRN: [id number]  Chief Complaint  Patient presents with  . Dizziness    Started 1 week ago. Has been having some dizziness/lightheaded, tired, headache/sinus. Increased fluids, added allergy medication. No change in symptoms. Worried about family hx of stroke. Had elevated bp 157-158/75, 132/65    HPI Patient is in today for tiredness x 1 week. BP was elevated this morning at 158/75, but normal in office today. She started taking Allegra daily in the mornings about one week ago for some sinus and allergy symptoms. No fever or chills. No SOB. No body aches. No known sick exposures. She is COVID-19 vaccinated. Pt states she knows she has not been eating or sleeping well lately (goes to bed around 10 pm and is awake usually every hour or so).   Past Medical History:  Diagnosis Date  . Allergy    Dogs, dust, and mold  . Anxiety   . Arthritis   . Benign essential HTN 08/31/2015  . Chronic neck pain   . Colon polyps   . Depression   . Heart murmur   . Heart murmur   . Hypercholesteremia   . Hyperlipidemia 05/2005  . Hypothyroid 06/2009  . Osteoarthritis of thoracic spine   . Shingles 06/2011    Past Surgical History:  Procedure Laterality Date  . COLONOSCOPY  2009  . TUBAL LIGATION  1984 & 05/2005   reversal in 1995  . tubal reversal      Family History  Problem Relation Age of Onset  . Thyroid disease Mother   . Lung cancer Mother   . CVA Father   . Diabetes Father   . Hyperlipidemia Father   . Hashimoto's thyroiditis Sister   . CVA Paternal Grandfather   . CVA Paternal Grandmother   . Breast cancer Neg Hx     Social History   Socioeconomic History  . Marital status: Married    Spouse name: Not on file  . Number of children: 3  . Years of education: Not on file  . Highest education level: Not on file  Occupational History  . Not on file  Tobacco Use  .  Smoking status: Former Smoker    Packs/day: 0.50    Years: 25.00    Pack years: 12.50    Quit date: 02/25/2005    Years since quitting: 15.4  . Smokeless tobacco: Never Used  Vaping Use  . Vaping Use: Never used  Substance and Sexual Activity  . Alcohol use: Yes    Alcohol/week: 5.0 standard drinks    Types: 5 Glasses of wine per week  . Drug use: No  . Sexual activity: Not Currently    Partners: Male    Birth control/protection: Surgical    Comment: BTL  Other Topics Concern  . Not on file  Social History Narrative  . Not on file   Social Determinants of Health   Financial Resource Strain: Not on file  Food Insecurity: Not on file  Transportation Needs: Not on file  Physical Activity: Not on file  Stress: Not on file  Social Connections: Not on file  Intimate Partner Violence: Not on file    Outpatient Medications Prior to Visit  Medication Sig Dispense Refill  . albuterol (VENTOLIN HFA) 108 (90 Base) MCG/ACT inhaler INHALE 2 PUFFS BY MOUTH EVERY 6 HOURS AS NEEDED FOR WHEEZE OR SHORTNESS OF  BREATH 18 g 2  . estradiol (ESTRACE) 0.5 MG tablet Take 0.5 tablets (0.25 mg total) by mouth daily. 45 tablet 3  . fluticasone (FLONASE) 50 MCG/ACT nasal spray USE 2 SPRAYS IN EACH  NOSTRIL DAILY 48 g 1  . hydrochlorothiazide (HYDRODIURIL) 12.5 MG tablet TAKE 1 TABLET BY MOUTH  DAILY 90 tablet 3  . levothyroxine (SYNTHROID) 50 MCG tablet TAKE 1 TABLET BY MOUTH  DAILY BEFORE BREAKFAST 90 tablet 3  . medroxyPROGESTERone (PROVERA) 2.5 MG tablet Take 1 tablet (2.5 mg total) by mouth daily. 90 tablet 3  . meloxicam (MOBIC) 15 MG tablet TAKE 1 TABLET BY MOUTH EVERY DAY 30 tablet 1  . montelukast (SINGULAIR) 10 MG tablet Take 1 tablet (10 mg total) by mouth at bedtime. 90 tablet 1  . PULMICORT FLEXHALER 180 MCG/ACT inhaler INHALE 2 PUFFS INTO THE LUNGS IN THE MORNING AND AT BEDTIME. 1 each 2  . rosuvastatin (CRESTOR) 5 MG tablet TAKE 1 TABLET BY MOUTH  DAILY 90 tablet 3  . sertraline  (ZOLOFT) 25 MG tablet Take 1 tablet (25 mg total) by mouth daily. 90 tablet 3  . VITAMIN D PO Take 2,000 Int'l Units by mouth.    . Biotin 1 MG CAPS Take by mouth. (Patient not taking: Reported on 04/03/2020)    . Vitamin D, Ergocalciferol, (DRISDOL) 1.25 MG (50000 UNIT) CAPS capsule Take 1 capsule (50,000 Units total) by mouth every 7 (seven) days. (Patient not taking: Reported on 04/27/2020) 12 capsule 0   No facility-administered medications prior to visit.    Allergies  Allergen Reactions  . Lisinopril Cough  . Oxycodone-Acetaminophen Nausea Only    Review of Systems REFER TO HPI FOR PERTINENT POSITIVES AND NEGATIVES     Objective:    Physical Exam Vitals and nursing note reviewed.  Constitutional:      Appearance: Normal appearance. She is normal weight. She is not toxic-appearing.  HENT:     Head: Normocephalic and atraumatic.     Right Ear: Tympanic membrane, ear canal and external ear normal.     Left Ear: Tympanic membrane, ear canal and external ear normal.     Nose: Nose normal.     Mouth/Throat:     Mouth: Mucous membranes are moist.  Eyes:     Extraocular Movements: Extraocular movements intact.     Conjunctiva/sclera: Conjunctivae normal.     Pupils: Pupils are equal, round, and reactive to light.  Cardiovascular:     Rate and Rhythm: Normal rate and regular rhythm.     Pulses: Normal pulses.     Heart sounds: Normal heart sounds.  Pulmonary:     Effort: Pulmonary effort is normal.     Breath sounds: Normal breath sounds.  Abdominal:     General: Abdomen is flat. Bowel sounds are normal.     Palpations: Abdomen is soft.  Musculoskeletal:        General: Normal range of motion.     Cervical back: Normal range of motion and neck supple.  Skin:    General: Skin is warm and dry.  Neurological:     General: No focal deficit present.     Mental Status: She is alert and oriented to person, place, and time.     Cranial Nerves: No cranial nerve deficit.      Motor: No weakness.     Gait: Gait normal.  Psychiatric:        Mood and Affect: Mood normal.  Behavior: Behavior normal.        Thought Content: Thought content normal.        Judgment: Judgment normal.     BP 120/64   Pulse 72   Temp 98.1 F (36.7 C) (Temporal)   Resp 14   Ht _0  (1.6 m)   Wt 136 lb (61.7 kg)   LMP 03/10/2010   SpO2 99%   BMI 24.09 kg/m  Wt Readings from Last 3 Encounters:  07/25/20 136 lb (61.7 kg)  04/27/20 137 lb (62.1 kg)  04/03/20 134 lb 12.8 oz (61.1 kg)      Assessment & Plan:   Problem List Items Addressed This Visit      Cardiovascular and Mediastinum   HTN (hypertension)   Relevant Orders   CBC with Differential/Platelet   Comp Met (CMET)     Respiratory   Allergic rhinitis     Other   Vitamin D deficiency   Relevant Orders   VITAMIN D 25 Hydroxy (Vit-D Deficiency, Fractures)    Other Visit Diagnoses    Fatigue, unspecified type    -  Primary   Relevant Orders   CBC with Differential/Platelet   Comp Met (CMET)   TSH   Encounter for laboratory testing for COVID-19 virus       Relevant Orders   Novel Coronavirus, NAA ([facility])     1. Fatigue, unspecified type Worsening fatigue over the last week.  Unknown cause at this time.  Will check for COVID-19 virus given the current pandemic situation and will also check basic labs.  Allegra may be worsening the fatigue.  Advised her to try taking this in the evening time instead.  2. Primary hypertension Blood pressure is normal in office today.  She will continue to monitor at home.  Limit salt and drink plenty of water.  Work on [employer].  3. Vitamin D deficiency We will recheck vitamin D level in office today.  4. Non-seasonal allergic rhinitis due to other allergic trigger May continue the Allegra for now as long as it is not worsening her fatigue symptoms.  Otherwise she may need to try Singulair or Claritin instead.  5. Encounter for laboratory testing for  COVID-19 virus We will call with results.  She should quarantine while pending results.   No orders of the defined types were placed in this encounter.    Jennilee Demarco M Melvin Whiteford, PA-C

## 2020-07-25 NOTE — Patient Instructions (Addendum)
We will test for COVID-19 today, as well as basic blood work, and call with results.  Important to drink plenty of water, try to walk daily, and work on [employer]. Please talk with Dr. Beverely Low about your erratic sleep patterns.  Also, try taking the Allegra in the evenings instead of mornings.

## 2020-07-27 LAB — NOVEL CORONAVIRUS, NAA: SARS-CoV-2, NAA: NOT DETECTED

## 2020-07-27 LAB — SARS-COV-2, NAA 2 DAY TAT

## 2020-07-30 ENCOUNTER — Other Ambulatory Visit: Payer: Self-pay | Admitting: Family Medicine

## 2020-09-15 ENCOUNTER — Other Ambulatory Visit: Payer: Self-pay | Admitting: Obstetrics & Gynecology

## 2020-10-04 ENCOUNTER — Encounter: Payer: Self-pay | Admitting: Family Medicine

## 2020-10-04 ENCOUNTER — Other Ambulatory Visit: Payer: Self-pay

## 2020-10-04 ENCOUNTER — Ambulatory Visit (INDEPENDENT_AMBULATORY_CARE_PROVIDER_SITE_OTHER): Payer: 59 | Admitting: Family Medicine

## 2020-10-04 VITALS — BP 120/80 | HR 69 | Temp 97.9°F | Resp 18 | Ht 63.0 in | Wt 133.4 lb

## 2020-10-04 DIAGNOSIS — J42 Unspecified chronic bronchitis: Secondary | ICD-10-CM | POA: Diagnosis not present

## 2020-10-04 DIAGNOSIS — E785 Hyperlipidemia, unspecified: Secondary | ICD-10-CM

## 2020-10-04 DIAGNOSIS — I10 Essential (primary) hypertension: Secondary | ICD-10-CM

## 2020-10-04 LAB — CBC WITH DIFFERENTIAL/PLATELET
Basophils Absolute: 0.1 10*3/uL (ref 0.0–0.1)
Basophils Relative: 1 % (ref 0.0–3.0)
Eosinophils Absolute: 0.4 10*3/uL (ref 0.0–0.7)
Eosinophils Relative: 6.9 % — ABNORMAL HIGH (ref 0.0–5.0)
HCT: 40.9 % (ref 36.0–46.0)
Hemoglobin: 13.9 g/dL (ref 12.0–15.0)
Lymphocytes Relative: 20.6 % (ref 12.0–46.0)
Lymphs Abs: 1.2 10*3/uL (ref 0.7–4.0)
MCHC: 34 g/dL (ref 30.0–36.0)
MCV: 94.6 fl (ref 78.0–100.0)
Monocytes Absolute: 0.5 10*3/uL (ref 0.1–1.0)
Monocytes Relative: 8.8 % (ref 3.0–12.0)
Neutro Abs: 3.7 10*3/uL (ref 1.4–7.7)
Neutrophils Relative %: 62.7 % (ref 43.0–77.0)
Platelets: 199 10*3/uL (ref 150.0–400.0)
RBC: 4.32 Mil/uL (ref 3.87–5.11)
RDW: 12.6 % (ref 11.5–15.5)
WBC: 5.9 10*3/uL (ref 4.0–10.5)

## 2020-10-04 LAB — BASIC METABOLIC PANEL
BUN: 13 mg/dL (ref 6–23)
CO2: 28 mEq/L (ref 19–32)
Calcium: 9.3 mg/dL (ref 8.4–10.5)
Chloride: 103 mEq/L (ref 96–112)
Creatinine, Ser: 0.69 mg/dL (ref 0.40–1.20)
GFR: 91.85 mL/min (ref >60.00)
Glucose, Bld: 88 mg/dL (ref 70–99)
Potassium: 3.9 mEq/L (ref 3.5–5.1)
Sodium: 141 mEq/L (ref 135–145)

## 2020-10-04 LAB — LIPID PANEL
Cholesterol: 155 mg/dL (ref 0–200)
HDL: 52.3 mg/dL (ref >39.00)
LDL Cholesterol: 82 mg/dL (ref 0–99)
NonHDL: 102.2
Total CHOL/HDL Ratio: 3
Triglycerides: 102 mg/dL (ref 0.0–149.0)
VLDL: 20.4 mg/dL (ref 0.0–40.0)

## 2020-10-04 LAB — HEPATIC FUNCTION PANEL
ALT: 11 U/L (ref 0–35)
AST: 19 U/L (ref 0–37)
Albumin: 4.3 g/dL (ref 3.5–5.2)
Alkaline Phosphatase: 49 U/L (ref 39–117)
Bilirubin, Direct: 0.1 mg/dL (ref 0.0–0.3)
Total Bilirubin: 0.5 mg/dL (ref 0.2–1.2)
Total Protein: 6.3 g/dL (ref 6.0–8.3)

## 2020-10-04 LAB — TSH: TSH: 2.65 u[IU]/mL (ref 0.35–4.50)

## 2020-10-04 MED ORDER — ALBUTEROL SULFATE HFA 108 (90 BASE) MCG/ACT IN AERS: INHALATION_SPRAY | RESPIRATORY_TRACT | 2 refills | Status: DC

## 2020-10-04 NOTE — Assessment & Plan Note (Signed)
Chronic problem.  Well controlled on HCTZ 12.5mg  daily.  Currently asymptomatic.  Check labs.  No anticipated med changes.

## 2020-10-04 NOTE — Progress Notes (Signed)
   Subjective:    Patient ID: Sonia Kim, female    DOB: [birth year], 65 y.o.   MRN: [id number]  HPI HTN- chronic problem, on HCTZ 12.5mg  daily w/ good control.  No CP, SOB, HAs, visual changes, edema.  Pt reports she is walking daily.  Hyperlipidemia- chronic problem, on Crestor 5mg  daily.  No abd pain, N/V  Chronic bronchitis- pt feels that after 2nd booster her lung issues 'cleared up'.  Has not needed the Pulmicort nor the rescue inhaler.  She reports insurance will no longer cover her specific albuterol   Review of Systems For ROS see HPI   This visit occurred during the SARS-CoV-2 public health emergency.  Safety protocols were in place, including screening questions prior to the visit, additional usage of staff PPE, and extensive cleaning of exam room while observing appropriate contact time as indicated for disinfecting solutions.       Objective:   Physical Exam Vitals reviewed.  Constitutional:      General: She is not in acute distress.    Appearance: Normal appearance. She is well-developed.  HENT:     Head: Normocephalic and atraumatic.  Eyes:     Conjunctiva/sclera: Conjunctivae normal.     Pupils: Pupils are equal, round, and reactive to light.  Neck:     Thyroid: No thyromegaly.  Cardiovascular:     Rate and Rhythm: Normal rate and regular rhythm.     Pulses: Normal pulses.     Heart sounds: Normal heart sounds. No murmur heard.   Pulmonary:     Effort: Pulmonary effort is normal. No respiratory distress.     Breath sounds: Normal breath sounds.  Abdominal:     General: There is no distension.     Palpations: Abdomen is soft.     Tenderness: There is no abdominal tenderness.  Musculoskeletal:     Cervical back: Normal range of motion and neck supple.     Right lower leg: No edema.     Left lower leg: No edema.  Lymphadenopathy:     Cervical: No cervical adenopathy.  Skin:    General: Skin is warm and dry.  Neurological:     General: No  focal deficit present.     Mental Status: She is alert and oriented to person, place, and time.  Psychiatric:        Behavior: Behavior normal.           Assessment & Plan:  Chronic bronchitis- pt reports that since her 2nd booster she has not needed her daily Pulmicort nor her rescue inhaler.  She does need a refill on Albuterol to carry with her- just in case.  Prescription sent.

## 2020-10-04 NOTE — Assessment & Plan Note (Signed)
Chronic problem.  On Crestor 5mg daily w/o difficulty.  Check labs.  Adjust meds prn  

## 2020-10-04 NOTE — Patient Instructions (Signed)
Schedule your complete physical in 6 months We'll notify you of your lab results and make any changes if needed Continue to work on healthy diet and regular exercise- you look great!!! Call with any questions or concerns Happy Spring!! 

## 2020-10-10 ENCOUNTER — Other Ambulatory Visit: Payer: Self-pay | Admitting: Family Medicine

## 2020-10-10 DIAGNOSIS — Z1231 Encounter for screening mammogram for malignant neoplasm of breast: Secondary | ICD-10-CM

## 2020-11-01 ENCOUNTER — Ambulatory Visit: Payer: 59 | Admitting: Obstetrics and Gynecology

## 2020-11-10 ENCOUNTER — Ambulatory Visit: Payer: 59 | Admitting: Family Medicine

## 2020-11-30 ENCOUNTER — Ambulatory Visit: Payer: 59

## 2020-12-06 ENCOUNTER — Encounter: Payer: Self-pay | Admitting: *Deleted

## 2020-12-06 ENCOUNTER — Encounter: Payer: Self-pay | Admitting: Family Medicine

## 2020-12-06 ENCOUNTER — Other Ambulatory Visit: Payer: Self-pay

## 2020-12-06 ENCOUNTER — Ambulatory Visit (INDEPENDENT_AMBULATORY_CARE_PROVIDER_SITE_OTHER): Payer: 59 | Admitting: Family Medicine

## 2020-12-06 VITALS — BP 136/80 | HR 66 | Temp 98.7°F | Resp 17 | Ht 63.0 in | Wt 131.4 lb

## 2020-12-06 DIAGNOSIS — R1013 Epigastric pain: Secondary | ICD-10-CM | POA: Diagnosis not present

## 2020-12-06 LAB — CBC WITH DIFFERENTIAL/PLATELET
Basophils Absolute: 0.1 10*3/uL (ref 0.0–0.1)
Basophils Relative: 1.5 % (ref 0.0–3.0)
Eosinophils Absolute: 0.3 10*3/uL (ref 0.0–0.7)
Eosinophils Relative: 3.9 % (ref 0.0–5.0)
HCT: 43.5 % (ref 36.0–46.0)
Hemoglobin: 14.8 g/dL (ref 12.0–15.0)
Lymphocytes Relative: 19.4 % (ref 12.0–46.0)
Lymphs Abs: 1.3 10*3/uL (ref 0.7–4.0)
MCHC: 34 g/dL (ref 30.0–36.0)
MCV: 93.5 fl (ref 78.0–100.0)
Monocytes Absolute: 0.6 10*3/uL (ref 0.1–1.0)
Monocytes Relative: 8.5 % (ref 3.0–12.0)
Neutro Abs: 4.4 10*3/uL (ref 1.4–7.7)
Neutrophils Relative %: 66.7 % (ref 43.0–77.0)
Platelets: 208 10*3/uL (ref 150.0–400.0)
RBC: 4.65 Mil/uL (ref 3.87–5.11)
RDW: 12.9 % (ref 11.5–15.5)
WBC: 6.6 10*3/uL (ref 4.0–10.5)

## 2020-12-06 LAB — HEPATIC FUNCTION PANEL
ALT: 12 U/L (ref 0–35)
AST: 20 U/L (ref 0–37)
Albumin: 4.5 g/dL (ref 3.5–5.2)
Alkaline Phosphatase: 55 U/L (ref 39–117)
Bilirubin, Direct: 0.1 mg/dL (ref 0.0–0.3)
Total Bilirubin: 0.7 mg/dL (ref 0.2–1.2)
Total Protein: 6.5 g/dL (ref 6.0–8.3)

## 2020-12-06 LAB — BASIC METABOLIC PANEL
BUN: 14 mg/dL (ref 6–23)
CO2: 29 mEq/L (ref 19–32)
Calcium: 9.6 mg/dL (ref 8.4–10.5)
Chloride: 102 mEq/L (ref 96–112)
Creatinine, Ser: 0.78 mg/dL (ref 0.40–1.20)
GFR: 80.29 mL/min (ref >60.00)
Glucose, Bld: 95 mg/dL (ref 70–99)
Potassium: 3.8 mEq/L (ref 3.5–5.1)
Sodium: 140 mEq/L (ref 135–145)

## 2020-12-06 LAB — LIPASE: Lipase: 53 U/L (ref 11.0–59.0)

## 2020-12-06 LAB — AMYLASE: Amylase: 44 U/L (ref 27–131)

## 2020-12-06 MED ORDER — SUCRALFATE 1 G PO TABS: 1.0000 g | ORAL_TABLET | Freq: Three times a day (TID) | ORAL | 0 refills | Status: DC

## 2020-12-06 MED ORDER — OMEPRAZOLE 40 MG PO CPDR
40.0000 mg | DELAYED_RELEASE_CAPSULE | Freq: Every day | ORAL | 3 refills | Status: DC
Start: 2020-12-06 — End: 2021-03-12

## 2020-12-06 NOTE — Patient Instructions (Signed)
Follow up as needed or as scheduled We'll notify you of your lab results and make any changes if needed START the Omeprazole once daily to decrease acid production TAKE the Carafate prior to meals to protect the stomach lining Try and avoid large amounts of acidic or spicy foods as the stomach is healing Call with any questions or concerns- particularly if not improving Hang in there!!!

## 2020-12-06 NOTE — Progress Notes (Signed)
   Subjective:    Patient ID: Sonia Kim, female    DOB: [birth year], 65 y.o.   MRN: [id number]  HPI 'stomach issues'- sxs started ~3 weeks ago.  Nausea- particularly after eating.  + early satiety.  Reports discomfort under ribs bilaterally.  Applying pressure to that area 'makes me want to throw up'.  No vomiting.  No fevers, diarrhea.  + constipation.  When she wakes, she has the sensation of 'an air bubble' in epigastric area and the feeling that she needs to 'belch but I can't'.  Has decreased carb intake and feels better if she doesn't eat carbs.  Has difficulty w/ greasy food, acidic or spicy food.  Some improvement w/ Lactaid milk.  Will have some burping/regurg depending on what she eats.  Pt reports she has recently had increased NSAID intake due to back pain.     Review of Systems For ROS see HPI   This visit occurred during the SARS-CoV-2 public health emergency.  Safety protocols were in place, including screening questions prior to the visit, additional usage of staff PPE, and extensive cleaning of exam room while observing appropriate contact time as indicated for disinfecting solutions.      Objective:   Physical Exam Vitals reviewed.  Constitutional:      General: She is not in acute distress.    Appearance: Normal appearance. She is not ill-appearing.  HENT:     Head: Normocephalic and atraumatic.  Cardiovascular:     Rate and Rhythm: Normal rate and regular rhythm.  Pulmonary:     Effort: Pulmonary effort is normal. No respiratory distress.     Breath sounds: Normal breath sounds.  Abdominal:     General: Abdomen is flat. Bowel sounds are increased. There is no distension.     Palpations: Abdomen is soft.     Tenderness: There is no abdominal tenderness. There is no guarding.  Skin:    General: Skin is warm and dry.  Neurological:     General: No focal deficit present.     Mental Status: She is alert and oriented to person, place, and time.  Psychiatric:         Mood and Affect: Mood normal.        Behavior: Behavior normal.        Thought Content: Thought content normal.          Assessment & Plan:   Epigastric pain- new.  Suspect this is due to increased NSAID use and likely gastritis.  Pt to hold NSAIDs, start PPI, and use Carafate to allow healing.  Will check LFTs, pancreatic enzymes, electrolytes, and CBC to r/o infxn or biliary abnormality.  Encouraged her to hold off on acidic or spicy foods.  Reviewed supportive care and red flags that should prompt return.  Pt expressed understanding and is in agreement w/ plan.

## 2020-12-07 ENCOUNTER — Ambulatory Visit: Payer: 59

## 2020-12-12 ENCOUNTER — Encounter: Payer: Self-pay | Admitting: Family Medicine

## 2020-12-12 NOTE — Telephone Encounter (Signed)
Results sent to provider

## 2020-12-25 ENCOUNTER — Other Ambulatory Visit: Payer: Self-pay | Admitting: Family Medicine

## 2020-12-25 NOTE — Telephone Encounter (Signed)
Last filled 12/06/20 #90 with no refills Last visit 12/06/20 Next visit 04/06/21

## 2021-01-12 ENCOUNTER — Inpatient Hospital Stay: Admission: RE | Admit: 2021-01-12 | Payer: 59 | Source: Ambulatory Visit

## 2021-01-30 ENCOUNTER — Other Ambulatory Visit: Payer: Self-pay | Admitting: Obstetrics & Gynecology

## 2021-02-02 ENCOUNTER — Other Ambulatory Visit: Payer: Self-pay

## 2021-02-02 ENCOUNTER — Encounter: Payer: Self-pay | Admitting: Family Medicine

## 2021-02-02 ENCOUNTER — Ambulatory Visit (INDEPENDENT_AMBULATORY_CARE_PROVIDER_SITE_OTHER): Payer: 59 | Admitting: Family Medicine

## 2021-02-02 VITALS — BP 122/60 | HR 72 | Temp 98.8°F | Wt 134.8 lb

## 2021-02-02 DIAGNOSIS — K120 Recurrent oral aphthae: Secondary | ICD-10-CM | POA: Diagnosis not present

## 2021-02-02 MED ORDER — MAGIC MOUTHWASH: ORAL | 0 refills | Status: DC

## 2021-02-02 NOTE — Progress Notes (Signed)
Subjective:    Patient ID: Sonia Kim, female    DOB: Aug 21, 1955, 65 y.o.   MRN: [id number]  Chief Complaint  Patient presents with   Mouth Lesions    Ulcer in back of throat, noticed 5 days ago. Has been using mouthwash with peroxide    HPI Patient was seen today for acute concern.  Patient endorses ulcer in back of throat x5 days.  Causing some discomfort.  Use mouthwash with peroxide and gargling with warm salt water.  Patient eating more fruit and tomatoes over the summer.  He denies sore throat, heart burn, fever, chills.  Mentions having ongoing "stomach issues".  Was taking Prilosec and sucralfate.  Past Medical History:  Diagnosis Date   Allergy    Dogs, dust, and mold   Anxiety    Arthritis    Benign essential HTN 08/31/2015   Chronic neck pain    Colon polyps    Depression    Heart murmur    Heart murmur    Hypercholesteremia    Hyperlipidemia 05/2005   Hypothyroid 06/2009   Osteoarthritis of thoracic spine    Shingles 06/2011    Allergies  Allergen Reactions   Lisinopril Cough   Oxycodone-Acetaminophen Nausea Only    ROS General: Denies fever, chills, night sweats, changes in weight, changes in appetite HEENT: Denies headaches, ear pain, changes in vision, rhinorrhea, sore throat +ulcer in mouth CV: Denies CP, palpitations, SOB, orthopnea Pulm: Denies SOB, cough, wheezing GI: Denies abdominal pain, nausea, vomiting, diarrhea, constipation GU: Denies dysuria, hematuria, frequency, vaginal discharge Msk: Denies muscle cramps, joint pains Neuro: Denies weakness, numbness, tingling Skin: Denies rashes, bruising Psych: Denies depression, anxiety, hallucinations     Objective:    Blood pressure 122/60, pulse 72, temperature 98.8 F (37.1 C), temperature source Oral, weight 134 lb 12.8 oz (61.1 kg), last menstrual period 03/10/2010, SpO2 98 %.  Gen. Pleasant, well-nourished, in no distress, normal affect   HEENT: [place]/AT, face symmetric, conjunctiva  clear, no scleral icterus, PERRLA, EOMI, nares patent without drainage, L soft palate with ulcer, pharynx without erythema or exudate.  No cervical lymphadenopathy. Lungs: no accessory muscle use Cardiovascular: RRR, no peripheral edema Musculoskeletal: No deformities, no cyanosis or clubbing, normal tone Neuro:  A&Ox3, CN II-XII intact, normal gait Skin:  Warm, no lesions/ rash   Wt Readings from Last 3 Encounters:  02/02/21 134 lb 12.8 oz (61.1 kg)  12/06/20 131 lb 6.4 oz (59.6 kg)  10/04/20 133 lb 6.4 oz (60.5 kg)    Lab Results  Component Value Date   WBC 6.6 12/06/2020   HGB 14.8 12/06/2020   HCT 43.5 12/06/2020   PLT 208.0 12/06/2020   GLUCOSE 95 12/06/2020   CHOL 155 10/04/2020   TRIG 102.0 10/04/2020   HDL 52.30 10/04/2020   LDLDIRECT 106.0 08/14/2016   LDLCALC 82 10/04/2020   ALT 12 12/06/2020   AST 20 12/06/2020   NA 140 12/06/2020   K 3.8 12/06/2020   CL 102 12/06/2020   CREATININE 0.78 12/06/2020   BUN 14 12/06/2020   CO2 29 12/06/2020   TSH 2.65 10/04/2020   HGBA1C 5.9 08/15/2016    Assessment/Plan:  Oral aphthous ulcer  - Plan: magic mouthwash SOLN  F/u prn  Abbe Amsterdam, MD

## 2021-02-20 ENCOUNTER — Other Ambulatory Visit: Payer: Self-pay | Admitting: Family Medicine

## 2021-03-05 ENCOUNTER — Other Ambulatory Visit: Payer: Self-pay

## 2021-03-05 ENCOUNTER — Ambulatory Visit
Admission: RE | Admit: 2021-03-05 | Discharge: 2021-03-05 | Disposition: A | Discharge status: Home / Self Care | Payer: 59 | Source: Ambulatory Visit | Attending: Family Medicine | Admitting: Family Medicine

## 2021-03-05 DIAGNOSIS — Z1231 Encounter for screening mammogram for malignant neoplasm of breast: Secondary | ICD-10-CM

## 2021-03-08 ENCOUNTER — Telehealth: Payer: Self-pay | Admitting: Family Medicine

## 2021-03-08 NOTE — Telephone Encounter (Signed)
[facility] (Dr. Loreta Ave) is requesting a copy of Ms. Demchak's medication list.  They would like it faxed to [phone]

## 2021-03-08 NOTE — Telephone Encounter (Signed)
Patients medication list has been faxed to Dr Loreta Ave at [facility]

## 2021-03-12 ENCOUNTER — Other Ambulatory Visit: Payer: Self-pay

## 2021-03-12 ENCOUNTER — Ambulatory Visit (INDEPENDENT_AMBULATORY_CARE_PROVIDER_SITE_OTHER): Payer: 59 | Admitting: Obstetrics & Gynecology

## 2021-03-12 ENCOUNTER — Encounter (HOSPITAL_BASED_OUTPATIENT_CLINIC_OR_DEPARTMENT_OTHER): Payer: Self-pay | Admitting: Obstetrics & Gynecology

## 2021-03-12 ENCOUNTER — Other Ambulatory Visit (HOSPITAL_COMMUNITY)
Admission: RE | Admit: 2021-03-12 | Discharge: 2021-03-12 | Disposition: A | Discharge status: Home / Self Care | Payer: 59 | Source: Ambulatory Visit | Attending: Obstetrics & Gynecology | Admitting: Obstetrics & Gynecology

## 2021-03-12 VITALS — BP 144/74 | HR 65 | Ht 62.0 in | Wt 131.6 lb

## 2021-03-12 DIAGNOSIS — Z780 Asymptomatic menopausal state: Secondary | ICD-10-CM | POA: Diagnosis not present

## 2021-03-12 DIAGNOSIS — Z124 Encounter for screening for malignant neoplasm of cervix: Secondary | ICD-10-CM

## 2021-03-12 DIAGNOSIS — Z01419 Encounter for gynecological examination (general) (routine) without abnormal findings: Secondary | ICD-10-CM | POA: Diagnosis not present

## 2021-03-12 NOTE — Progress Notes (Signed)
65 y.o. G36P0003 Married White or Caucasian female here for annual exam.  Former pt of [name], NP.  She has stopped her HRT for about two weeks.  Denies vaginal bleeding.    Patient's last menstrual period was 03/10/2010.          Sexually active: No.  The current method of family planning is post menopausal status.    Exercising: Yes.     walking Smoker:  history of smoking, quit 16 years ago  Health Maintenance: Pap:  04/09/2018 History of abnormal Pap:  no MMG:  03/05/2021 Colonoscopy:  02/17/2019, follow up 7 years.  Dr. Loreta Ave BMD:   2018 Screening Labs: Dr. Beverely Low   reports that she quit smoking about 16 years ago. Her smoking use included cigarettes. She has a 12.50 pack-year smoking history. She has never used smokeless tobacco. She reports current alcohol use of about 5.0 standard drinks per week. She reports that she does not use drugs.  Past Medical History:  Diagnosis Date   Allergy    Dogs, dust, and mold   Anxiety    Arthritis    Benign essential HTN 08/31/2015   Chronic neck pain    Colon polyps    Depression    Heart murmur    Heart murmur    Hypercholesteremia    Hyperlipidemia 05/2005   Hypothyroid 06/2009   Osteoarthritis of thoracic spine    Shingles 06/2011    Past Surgical History:  Procedure Laterality Date   COLONOSCOPY  2009   TUBAL LIGATION  1984 & 05/2005   reversal in 1995   tubal reversal      Current Outpatient Medications  Medication Sig Dispense Refill   albuterol (VENTOLIN HFA) 108 (90 Base) MCG/ACT inhaler INHALE 2 PUFFS BY MOUTH EVERY 6 HOURS AS NEEDED FOR WHEEZE OR SHORTNESS OF BREATH 18 g 2   fluticasone (FLONASE) 50 MCG/ACT nasal spray USE 2 SPRAYS IN EACH  NOSTRIL DAILY 48 g 1   hydrochlorothiazide (HYDRODIURIL) 12.5 MG tablet TAKE 1 TABLET BY MOUTH  DAILY 90 tablet 3   levothyroxine (SYNTHROID) 50 MCG tablet TAKE 1 TABLET BY MOUTH  DAILY BEFORE BREAKFAST 90 tablet 3   meloxicam (MOBIC) 15 MG tablet TAKE 1 TABLET BY MOUTH EVERY  DAY 30 tablet 1   PULMICORT FLEXHALER 180 MCG/ACT inhaler INHALE 2 PUFFS INTO THE LUNGS IN THE MORNING AND AT BEDTIME. 1 each 2   rosuvastatin (CRESTOR) 5 MG tablet TAKE 1 TABLET BY MOUTH  DAILY 90 tablet 3   sertraline (ZOLOFT) 25 MG tablet TAKE 1 TABLET BY MOUTH  DAILY 90 tablet 3   sucralfate (CARAFATE) 1 g tablet TAKE 1 TABLET (1 G TOTAL) BY MOUTH 4 (FOUR) TIMES DAILY - WITH MEALS AND AT BEDTIME. (Patient not taking: No sig reported) 90 tablet 0   VITAMIN D PO Take 2,000 Int'l Units by mouth.     magic mouthwash SOLN Swish and spit 5 mL 4 times daily as needed. (Patient not taking: Reported on 03/12/2021) 200 mL 0   No current facility-administered medications for this visit.    Family History  Problem Relation Age of Onset   Thyroid disease Mother    Lung cancer Mother    CVA Father    Diabetes Father    Hyperlipidemia Father    Hashimoto's thyroiditis Sister    CVA Paternal Grandfather    CVA Paternal Grandmother    Breast cancer Neg Hx     Review of Systems  All other  systems reviewed and are negative.  Exam:   BP (!) 144/74 (BP Location: Right Arm, Patient Position: Sitting, Cuff Size: Normal)   Pulse 65   Ht 5\' 2"  (1.575 m)   Wt 131 lb 9.6 oz (59.7 kg)   LMP 03/10/2010   BMI 24.07 kg/m   Height: 5\' 2"  (157.5 cm)  General appearance: alert, cooperative and appears stated age Head: Normocephalic, without obvious abnormality, atraumatic Neck: no adenopathy, supple, symmetrical, trachea midline and thyroid normal to inspection and palpation Lungs: clear to auscultation bilaterally Breasts: normal appearance, no masses or tenderness Heart: regular rate and rhythm Abdomen: soft, non-tender; bowel sounds normal; no masses,  no organomegaly Extremities: extremities normal, atraumatic, no cyanosis or edema Skin: Skin color, texture, turgor normal. No rashes or lesions Lymph nodes: Cervical, supraclavicular, and axillary nodes normal. No abnormal inguinal nodes  palpated Neurologic: Grossly normal   Pelvic: External genitalia:  no lesions              Urethra:  normal appearing urethra with no masses, tenderness or lesions              Bartholins and Skenes: normal                 Vagina: normal appearing vagina with normal color and no discharge, no lesions              Cervix: no lesions              Pap taken: Yes.   Bimanual Exam:  Uterus:  normal size, contour, position, consistency, mobility, non-tender              Adnexa: normal adnexa and no mass, fullness, tenderness               Rectovaginal: Confirms               Anus:  normal sphincter tone, no lesions  Chaperone, 05/10/2010, CMA, was present for exam.  Assessment/Plan: 1. Well woman exam with routine gynecological exam - pap smear obtained today - plan BMD next year with MMG - MMG 02/2021 - Colonoscopy 02/2019, follow up 7 tears - lab work done with Dr. 03/2021 - care gaps reviewed/updated  2. Postmenopausal - no HRT

## 2021-03-12 NOTE — Patient Instructions (Addendum)
1200mg  calcium with 800-1000IU Vit D Os cal and Citracal

## 2021-03-13 ENCOUNTER — Other Ambulatory Visit: Payer: Self-pay | Admitting: Family Medicine

## 2021-03-13 LAB — CYTOLOGY - PAP
Comment: NEGATIVE
Diagnosis: NEGATIVE
High risk HPV: NEGATIVE

## 2021-04-06 ENCOUNTER — Encounter: Payer: Self-pay | Admitting: Family Medicine

## 2021-04-06 ENCOUNTER — Telehealth: Payer: Self-pay

## 2021-04-06 ENCOUNTER — Other Ambulatory Visit: Payer: Self-pay

## 2021-04-06 ENCOUNTER — Ambulatory Visit (INDEPENDENT_AMBULATORY_CARE_PROVIDER_SITE_OTHER): Payer: 59 | Admitting: Family Medicine

## 2021-04-06 VITALS — BP 128/86 | HR 70 | Temp 98.0°F | Resp 17 | Ht 62.0 in | Wt 130.6 lb

## 2021-04-06 DIAGNOSIS — I700 Atherosclerosis of aorta: Secondary | ICD-10-CM | POA: Diagnosis not present

## 2021-04-06 DIAGNOSIS — E559 Vitamin D deficiency, unspecified: Secondary | ICD-10-CM | POA: Diagnosis not present

## 2021-04-06 DIAGNOSIS — Z0000 Encounter for general adult medical examination without abnormal findings: Secondary | ICD-10-CM

## 2021-04-06 DIAGNOSIS — I10 Essential (primary) hypertension: Secondary | ICD-10-CM | POA: Diagnosis not present

## 2021-04-06 LAB — HEPATIC FUNCTION PANEL
ALT: 11 U/L (ref 0–35)
AST: 20 U/L (ref 0–37)
Albumin: 4.4 g/dL (ref 3.5–5.2)
Alkaline Phosphatase: 53 U/L (ref 39–117)
Bilirubin, Direct: 0.1 mg/dL (ref 0.0–0.3)
Total Bilirubin: 0.7 mg/dL (ref 0.2–1.2)
Total Protein: 6.5 g/dL (ref 6.0–8.3)

## 2021-04-06 LAB — CBC WITH DIFFERENTIAL/PLATELET
Basophils Absolute: 0.1 10*3/uL (ref 0.0–0.1)
Basophils Relative: 1 % (ref 0.0–3.0)
Eosinophils Absolute: 0.2 10*3/uL (ref 0.0–0.7)
Eosinophils Relative: 3.3 % (ref 0.0–5.0)
HCT: 43.1 % (ref 36.0–46.0)
Hemoglobin: 14.4 g/dL (ref 12.0–15.0)
Lymphocytes Relative: 17.9 % (ref 12.0–46.0)
Lymphs Abs: 1.3 10*3/uL (ref 0.7–4.0)
MCHC: 33.4 g/dL (ref 30.0–36.0)
MCV: 94.9 fl (ref 78.0–100.0)
Monocytes Absolute: 0.5 10*3/uL (ref 0.1–1.0)
Monocytes Relative: 7.4 % (ref 3.0–12.0)
Neutro Abs: 5.1 10*3/uL (ref 1.4–7.7)
Neutrophils Relative %: 70.4 % (ref 43.0–77.0)
Platelets: 239 10*3/uL (ref 150.0–400.0)
RBC: 4.54 Mil/uL (ref 3.87–5.11)
RDW: 12.9 % (ref 11.5–15.5)
WBC: 7.2 10*3/uL (ref 4.0–10.5)

## 2021-04-06 LAB — BASIC METABOLIC PANEL
BUN: 14 mg/dL (ref 6–23)
CO2: 32 mEq/L (ref 19–32)
Calcium: 9.6 mg/dL (ref 8.4–10.5)
Chloride: 101 mEq/L (ref 96–112)
Creatinine, Ser: 0.76 mg/dL (ref 0.40–1.20)
GFR: 82.64 mL/min (ref >60.00)
Glucose, Bld: 106 mg/dL — ABNORMAL HIGH (ref 70–99)
Potassium: 4.3 mEq/L (ref 3.5–5.1)
Sodium: 140 mEq/L (ref 135–145)

## 2021-04-06 LAB — LIPID PANEL
Cholesterol: 188 mg/dL (ref 0–200)
HDL: 65.4 mg/dL (ref >39.00)
LDL Cholesterol: 102 mg/dL — ABNORMAL HIGH (ref 0–99)
NonHDL: 122.34
Total CHOL/HDL Ratio: 3
Triglycerides: 100 mg/dL (ref 0.0–149.0)
VLDL: 20 mg/dL (ref 0.0–40.0)

## 2021-04-06 LAB — TSH: TSH: 1.41 u[IU]/mL (ref 0.35–5.50)

## 2021-04-06 LAB — VITAMIN D 25 HYDROXY (VIT D DEFICIENCY, FRACTURES): VITD: 37.38 ng/mL (ref 30.00–100.00)

## 2021-04-06 NOTE — Progress Notes (Signed)
Subjective:    Patient ID: Sonia Kim, female    DOB: Jun 01, 1956, 65 y.o.   MRN: [id number]  HPI CPE- UTD on mammo, pap, colonoscopy, flu, Tdap, shingles.  'i feel pretty good'  Patient Care Team    Relationship Specialty Notifications Start End  Sheliah Hatch, MD PCP - General Family Medicine  08/14/16   Judi Saa, DO Consulting Physician Sports Medicine  06/19/16   Quintella Reichert, MD Consulting Physician Cardiology  06/19/16   Charna Elizabeth, MD Consulting Physician Gastroenterology  08/14/16   Ria Comment, FNP Nurse Practitioner Family Medicine  08/14/16     Health Maintenance  Topic Date Due   Pneumococcal Vaccine 84-62 Years old (1 - PCV) Never done   COVID-19 Vaccine (3 - Booster for Pfizer series) 11/08/2019   HIV Screening  04/06/2022 (Originally 07/21/1970)   MAMMOGRAM  03/05/2022   TETANUS/TDAP  09/12/2023   PAP SMEAR-Modifier  03/12/2024   COLONOSCOPY (Pts 45-66yrs Insurance coverage will need to be confirmed)  02/16/2026   INFLUENZA VACCINE  Completed   Hepatitis C Screening  Completed   Zoster Vaccines- Shingrix  Completed   HPV VACCINES  Aged Out      Review of Systems Patient reports no hearing changes, adenopathy,fever, weight change,  persistant/recurrent hoarseness , swallowing issues, chest pain, palpitations, edema, persistant/recurrent cough, hemoptysis, dyspnea (rest/exertional/paroxysmal nocturnal), gastrointestinal bleeding (melena, rectal bleeding), abdominal pain, significant heartburn, bowel changes, GU symptoms (dysuria, hematuria, incontinence), Gyn symptoms (abnormal  bleeding, pain),  syncope, focal weakness, memory loss, numbness & tingling, skin/nail changes, abnormal bruising or bleeding, anxiety, or depression.   + vision changes- new floaters present.  Has seen eye doctor. + thinning hair  This visit occurred during the SARS-CoV-2 public health emergency.  Safety protocols were in place, including screening questions prior to  the visit, additional usage of staff PPE, and extensive cleaning of exam room while observing appropriate contact time as indicated for disinfecting solutions.      Objective:   Physical Exam Vitals reviewed.  Constitutional:      General: She is not in acute distress.    Appearance: Normal appearance. She is well-developed. She is not ill-appearing.  HENT:     Head: Normocephalic and atraumatic.  Eyes:     Conjunctiva/sclera: Conjunctivae normal.     Pupils: Pupils are equal, round, and reactive to light.  Neck:     Thyroid: No thyromegaly.  Cardiovascular:     Rate and Rhythm: Normal rate and regular rhythm.     Pulses: Normal pulses.     Heart sounds: Murmur (II/VI) heard.  Pulmonary:     Effort: Pulmonary effort is normal. No respiratory distress.     Breath sounds: Normal breath sounds.  Abdominal:     General: There is no distension.     Palpations: Abdomen is soft.     Tenderness: There is no abdominal tenderness.  Musculoskeletal:     Cervical back: Normal range of motion and neck supple.     Right lower leg: No edema.     Left lower leg: No edema.  Lymphadenopathy:     Cervical: No cervical adenopathy.  Skin:    General: Skin is warm and dry.     Findings: No lesion or rash.  Neurological:     Mental Status: She is alert and oriented to person, place, and time.  Psychiatric:        Behavior: Behavior normal.  Assessment & Plan:

## 2021-04-06 NOTE — Assessment & Plan Note (Signed)
Check labs and replete prn. 

## 2021-04-06 NOTE — Assessment & Plan Note (Signed)
Chronic problem.  Well controlled.  Currently asymptomatic.  Check labs.  No anticipated med changes. 

## 2021-04-06 NOTE — Patient Instructions (Signed)
Follow up in 6 months to recheck BP and cholesterol We'll notify you of your lab results and make any changes if needed Continue to work on healthy diet and regular exercise- you're doing great! Call with any questions or concerns Stay Safe!  Stay Healthy! Happy Fall!!! 

## 2021-04-06 NOTE — Assessment & Plan Note (Signed)
Pt's PE WNL.  UTD on pap, mammo, colonoscopy, immunizations.  Check labs.  Anticipatory guidance provided.  

## 2021-04-06 NOTE — Assessment & Plan Note (Signed)
Noted on imaging.  Currently on statin.

## 2021-04-06 NOTE — Telephone Encounter (Signed)
Caller name:Sonia Kim  On DPR? :Yes  Call back number:[phone]  Provider they see: Beverely Low   Reason for call:Pt dropped off quest form to be completed placed in Dr Beverely Low bin

## 2021-04-08 ENCOUNTER — Other Ambulatory Visit: Payer: Self-pay | Admitting: Family Medicine

## 2021-04-10 ENCOUNTER — Telehealth: Payer: Self-pay

## 2021-04-10 NOTE — Telephone Encounter (Signed)
Patient presented to the office on 04/06/21 for her physical. Patient brought in a physical form for provider to fill. Form was filled in timely manner. However, the provider assistant Martie Lee was out of the office from the afternoon of 04/06/21 and all day on 04/09/21. When assistant returned to the office on 04/10/21 to fax back form, the form was not signed by patient. A call was made to [facility] to verify if form can still be sent after deadline and was informed that it would be up to patient employer. Patient was then called and notified that form needed to be signed before it can be faxed. Patient understood and stated that she will come to office today and sign form and reach out to employer concerning deadline of form.

## 2021-04-10 NOTE — Telephone Encounter (Signed)
This was addressed by Loma Newton.  See other phone note

## 2021-04-17 ENCOUNTER — Encounter: Payer: Self-pay | Admitting: Family Medicine

## 2021-05-01 ENCOUNTER — Other Ambulatory Visit: Payer: Self-pay

## 2021-05-01 MED ORDER — HYDROCHLOROTHIAZIDE 12.5 MG PO TABS: 12.5000 mg | ORAL_TABLET | Freq: Every day | ORAL | 3 refills | Status: DC

## 2021-05-19 ENCOUNTER — Other Ambulatory Visit (HOSPITAL_BASED_OUTPATIENT_CLINIC_OR_DEPARTMENT_OTHER): Payer: Self-pay | Admitting: Obstetrics & Gynecology

## 2021-05-23 ENCOUNTER — Encounter: Payer: Self-pay | Admitting: Family Medicine

## 2021-06-21 ENCOUNTER — Ambulatory Visit (INDEPENDENT_AMBULATORY_CARE_PROVIDER_SITE_OTHER): Payer: 59 | Admitting: Family Medicine

## 2021-06-21 ENCOUNTER — Encounter: Payer: Self-pay | Admitting: Family Medicine

## 2021-06-21 VITALS — BP 136/70 | HR 66 | Temp 99.1°F | Resp 16 | Wt 131.8 lb

## 2021-06-21 DIAGNOSIS — J4541 Moderate persistent asthma with (acute) exacerbation: Secondary | ICD-10-CM | POA: Diagnosis not present

## 2021-06-21 DIAGNOSIS — J029 Acute pharyngitis, unspecified: Secondary | ICD-10-CM | POA: Diagnosis not present

## 2021-06-21 LAB — POCT INFLUENZA A/B
Influenza A, POC: NEGATIVE
Influenza B, POC: NEGATIVE

## 2021-06-21 LAB — POC COVID19 BINAXNOW: SARS Coronavirus 2 Ag: NEGATIVE

## 2021-06-21 MED ORDER — GUAIFENESIN-CODEINE 100-10 MG/5ML PO SYRP: 10.0000 mL | ORAL_SOLUTION | Freq: Three times a day (TID) | ORAL | 0 refills | Status: DC | PRN

## 2021-06-21 MED ORDER — METHYLPREDNISOLONE ACETATE 80 MG/ML IJ SUSP
80.0000 mg | Freq: Once | INTRAMUSCULAR | Status: AC
Start: 2021-06-21 — End: 2021-06-21
  Administered 2021-06-21: 80 mg via INTRAMUSCULAR

## 2021-06-21 MED ORDER — PREDNISONE 10 MG PO TABS: ORAL_TABLET | ORAL | 0 refills | Status: DC

## 2021-06-21 NOTE — Patient Instructions (Addendum)
Follow up as needed or as scheduled START the Prednisone tomorrow as directed- take w/ food Continue your Pulmicort twice daily and albuterol as needed Continue Mucinex to thin your congestion Codeine cough syrup as needed- will help w/ sleep Call with any questions or concerns Hang in there!!!

## 2021-06-21 NOTE — Progress Notes (Signed)
° °  Subjective:    Patient ID: Sonia Kim, female    DOB: [birth year], 66 y.o.   MRN: [id number]  HPI Cough- pt reports ~2 weeks of sxs.  Cough has been worsening.  Difficulty breathing when lying down.  Has to sleep propped up.  Cough is productive of 'thick yellow mucous'.  + hx of asthma.  Using Pulmicort BID and Albuterol 'a lot'.  Using Mucinex w/ some relief.  Today complains of wheezing.  Denies fever, body aches, chills.   Review of Systems For ROS see HPI   This visit occurred during the SARS-CoV-2 public health emergency.  Safety protocols were in place, including screening questions prior to the visit, additional usage of staff PPE, and extensive cleaning of exam room while observing appropriate contact time as indicated for disinfecting solutions.      Objective:   Physical Exam Vitals reviewed.  Constitutional:      General: She is not in acute distress.    Appearance: Normal appearance. She is well-developed. She is not ill-appearing.  HENT:     Head: Normocephalic and atraumatic.     Right Ear: Tympanic membrane and ear canal normal.     Left Ear: Tympanic membrane and ear canal normal.     Nose: No congestion.  Eyes:     Conjunctiva/sclera: Conjunctivae normal.     Pupils: Pupils are equal, round, and reactive to light.  Cardiovascular:     Rate and Rhythm: Normal rate and regular rhythm.     Heart sounds: Normal heart sounds. No murmur heard. Pulmonary:     Effort: Pulmonary effort is normal. No respiratory distress.     Breath sounds: Wheezing (inspiratory and expiratory wheezes diffusely throughout) present. No rhonchi.  Musculoskeletal:     Cervical back: Normal range of motion and neck supple.  Lymphadenopathy:     Cervical: No cervical adenopathy.  Skin:    General: Skin is warm and dry.  Neurological:     General: No focal deficit present.     Mental Status: She is alert and oriented to person, place, and time.  Psychiatric:        Mood and  Affect: Mood normal.        Behavior: Behavior normal.        Thought Content: Thought content normal.          Assessment & Plan:  Asthma exacerbation- new.  Pt has hx of moderate persistent asthma.  Suspect she started w/ a viral illness which has resulted in airway inflammation/asthma exacerbation.  She is over-using her albuterol at this point so we will start steroids to open airways and improve wheezing.  Cough meds prn.  Reviewed supportive care and red flags that should prompt return.  Pt expressed understanding and is in agreement w/ plan.

## 2021-07-05 ENCOUNTER — Encounter: Payer: Self-pay | Admitting: Family Medicine

## 2021-07-05 ENCOUNTER — Ambulatory Visit (INDEPENDENT_AMBULATORY_CARE_PROVIDER_SITE_OTHER): Payer: 59 | Admitting: Family Medicine

## 2021-07-05 VITALS — BP 122/54 | HR 70 | Temp 97.9°F | Resp 16 | Wt 131.6 lb

## 2021-07-05 DIAGNOSIS — R42 Dizziness and giddiness: Secondary | ICD-10-CM

## 2021-07-05 DIAGNOSIS — H538 Other visual disturbances: Secondary | ICD-10-CM | POA: Diagnosis not present

## 2021-07-05 DIAGNOSIS — R519 Headache, unspecified: Secondary | ICD-10-CM

## 2021-07-05 MED ORDER — FLUTICASONE PROPIONATE 50 MCG/ACT NA SUSP: NASAL | 1 refills | Status: DC

## 2021-07-05 MED ORDER — OLOPATADINE HCL 0.2 % OP SOLN: 1.0000 [drp] | Freq: Every day | OPHTHALMIC | 3 refills | Status: AC

## 2021-07-05 NOTE — Progress Notes (Signed)
Subjective:    Patient ID: Sonia Kim, female    DOB: 01-25-1956, 66 y.o.   MRN: [id number]  HPI Dizziness- sxs started ~3 weeks ago.  Has hx of vertigo that started 5 yrs ago.  'it feels like I'm on a ship'.  Sxs are intermittent.  Worse in the AM and improves by late afternoon.  Feels this is different than vertigo- feels like she is leaning to the R.  Has difficulty determining where things are in space- will run into things.  Has been checking BPs at home- WNL.  Denies palpitations, CP.  Currently taking Allegra.  Dizziness may or may not worsen w/ position changes.  No recent head injury.  HA- frontal, occurs on either L or R side.  Lasts 5-10 minutes.  Described as a dull throbbing pain.  Occurs every other day, ~1x/day.  Denies auras.  Denies photo or phonophobia, no nausea.  Pt has never had HAs prior to the last month.  No particular time of day.    Blurry vision- reports floaters in R eye.  Blurry vision has been 'constant' since October.  Has not been worsening.   Review of Systems For ROS see HPI   This visit occurred during the SARS-CoV-2 public health emergency.  Safety protocols were in place, including screening questions prior to the visit, additional usage of staff PPE, and extensive cleaning of exam room while observing appropriate contact time as indicated for disinfecting solutions.      Objective:   Physical Exam Vitals reviewed.  Constitutional:      General: She is not in acute distress.    Appearance: Normal appearance. She is well-developed. She is not ill-appearing.  HENT:     Head: Normocephalic and atraumatic.     Right Ear: Tympanic membrane is retracted.     Left Ear: Tympanic membrane is retracted.     Nose: Mucosal edema and rhinorrhea present.     Right Sinus: No maxillary sinus tenderness or frontal sinus tenderness.     Left Sinus: No maxillary sinus tenderness or frontal sinus tenderness.     Mouth/Throat:     Pharynx: Posterior  oropharyngeal erythema (w/ PND) present.  Eyes:     Conjunctiva/sclera: Conjunctivae normal.     Pupils: Pupils are equal, round, and reactive to light.  Cardiovascular:     Rate and Rhythm: Normal rate and regular rhythm.     Heart sounds: Normal heart sounds.  Pulmonary:     Effort: Pulmonary effort is normal. No respiratory distress.     Breath sounds: Normal breath sounds. No wheezing or rales.  Musculoskeletal:     Cervical back: Normal range of motion and neck supple.  Lymphadenopathy:     Cervical: No cervical adenopathy.  Skin:    General: Skin is warm and dry.  Neurological:     General: No focal deficit present.     Mental Status: She is alert and oriented to person, place, and time.     Cranial Nerves: No cranial nerve deficit.     Motor: No weakness.     Coordination: Coordination normal.     Gait: Gait normal.     Deep Tendon Reflexes: Reflexes normal.  Psychiatric:        Mood and Affect: Mood normal.        Behavior: Behavior normal.        Thought Content: Thought content normal.          Assessment & Plan:  Dizziness- new.  Some sxs are consistent w/ vertigo but others- such as leaning to the right and running into things- aren't as typical.  Given that both TMs are retracted, I suspect this is contributing to her dizziness.  Start Flonase and continue daily antihistamine.  Will refer to neuro for complete evaluation.  Pt expressed understanding and is in agreement w/ plan.   Frontal HA- new.  Not consistent w/ migraine.  May be allergy or sinus related, but given that this is new and occurring regularly, will refer to Neuro for complete evaluation.  If sxs resolve prior to appt, she can cancel.  Pt expressed understanding and is in agreement w/ plan.   Blurry vision- new to provider, ongoing for pt.  Sxs since October- not worsening.  Encouraged her to schedule an eye exam ASAP.  Pt expressed understanding and is in agreement w/ plan.

## 2021-07-05 NOTE — Patient Instructions (Addendum)
Follow up as needed or as scheduled We'll call you with your neuro referral.  If you find that you're better by the time of the appt, you can cancel Make sure you are taking your Allegra daily RESTART the Flonase- 2 sprays each nostril daily ADD the Pataday- 1 drop in each eye daily Drink LOTS of fluids Please schedule an eye exam Call with any questions or concerns Hang in there!!!

## 2021-07-11 ENCOUNTER — Other Ambulatory Visit (HOSPITAL_BASED_OUTPATIENT_CLINIC_OR_DEPARTMENT_OTHER): Payer: Self-pay | Admitting: Obstetrics & Gynecology

## 2021-07-11 DIAGNOSIS — Z1231 Encounter for screening mammogram for malignant neoplasm of breast: Secondary | ICD-10-CM

## 2021-07-11 DIAGNOSIS — E2839 Other primary ovarian failure: Secondary | ICD-10-CM

## 2021-08-28 ENCOUNTER — Other Ambulatory Visit: Payer: Self-pay

## 2021-08-28 ENCOUNTER — Encounter: Payer: Self-pay | Admitting: Registered Nurse

## 2021-08-28 ENCOUNTER — Telehealth (INDEPENDENT_AMBULATORY_CARE_PROVIDER_SITE_OTHER): Payer: 59 | Admitting: Registered Nurse

## 2021-08-28 VITALS — BP 120/69 | Wt 130.0 lb

## 2021-08-28 DIAGNOSIS — J069 Acute upper respiratory infection, unspecified: Secondary | ICD-10-CM

## 2021-08-28 MED ORDER — PREDNISONE 10 MG (21) PO TBPK: ORAL_TABLET | ORAL | 0 refills | Status: DC

## 2021-08-28 MED ORDER — DOXYCYCLINE HYCLATE 100 MG PO TABS: 100.0000 mg | ORAL_TABLET | Freq: Two times a day (BID) | ORAL | 0 refills | Status: DC

## 2021-08-28 NOTE — Progress Notes (Signed)
? ? ?Telemedicine Encounter- SOAP NOTE Established Patient ? ?This telephone encounter was conducted with the patient's (or proxy's) verbal consent via audio telecommunications: yes/no: Yes ?Patient was instructed to have this encounter in a suitably private space; and to only have persons present to whom they give permission to participate. In addition, patient identity was confirmed by use of name plus two identifiers (DOB and address).  I discussed the limitations, risks, security and privacy concerns of performing an evaluation and management service by telephone and the availability of in person appointments. I also discussed with the patient that there may be a patient responsible charge related to this service. The patient expressed understanding and agreed to proceed. ? ?I spent a total of 17 minutes talking with the patient or their proxy. ? ?Patient at home ?Provider in office ? ?Participants: Jari Sportsman, NP and Ashok Cordia Portales ? ?Chief Complaint  ?Patient presents with  ? Cough  ?  Patient states she feels like she has an URI. Patient states she has been coughing , congestion , green mucus and just feeling bad. Patient states she tested negative for covid  ? ? ?Subjective  ? ?Sonia Kim is a 66 y.o. established patient. Telephone visit today for URI ? ?HPI ?Onset in early January. Given prednisone and cough syrup, resolved for almost two weeks then recurred. ?Cough and congestion worst at night. ?Allergens worsen symptoms.  ?Some mild shob today - thinks this is allergies/asthma as much as anything else.  ? ?Denies nvd, fevers, chills, sweats, sick contacts. ?Tested negative at home for COVID. ? ?Continues her medications per her med list. ? ?Patient Active Problem List  ? Diagnosis Date Noted  ? Calcification of abdominal aorta (HCC) 04/06/2021  ? HTN (hypertension) 11/19/2018  ? Shingles 04/25/2017  ? Allergic rhinitis 04/10/2017  ? Physical exam 02/14/2017  ? Hyperlipidemia 08/14/2016   ? Anxiety 08/14/2016  ? Nonallopathic lesion of cervical region 06/06/2016  ? Nonallopathic lesion of thoracic region 06/06/2016  ? Nonallopathic lesion of lumbosacral region 06/06/2016  ? Nonallopathic lesion of sacral region 06/06/2016  ? Polyarthralgia 05/09/2016  ? Strain of left piriformis muscle 04/09/2016  ? Heart palpitations 08/31/2015  ? Heart murmur 08/31/2015  ? Greater trochanteric bursitis of both hips 04/05/2015  ? Hypothyroid 01/16/2015  ? Vitamin D deficiency 01/16/2015  ? ? ?Past Medical History:  ?Diagnosis Date  ? Allergy   ? Dogs, dust, and mold  ? Anxiety   ? Arthritis   ? Benign essential HTN 08/31/2015  ? Chronic neck pain   ? Colon polyps   ? Depression   ? Heart murmur   ? Hypercholesteremia   ? Hyperlipidemia 05/2005  ? Hypothyroid 06/2009  ? Osteoarthritis of thoracic spine   ? Shingles 06/2011  ? ? ?Current Outpatient Medications  ?Medication Sig Dispense Refill  ? albuterol (VENTOLIN HFA) 108 (90 Base) MCG/ACT inhaler INHALE 2 PUFFS BY MOUTH EVERY 6 HOURS AS NEEDED FOR WHEEZE OR SHORTNESS OF BREATH 18 g 2  ? doxycycline (VIBRA-TABS) 100 MG tablet Take 1 tablet (100 mg total) by mouth 2 (two) times daily. 20 tablet 0  ? fluticasone (FLONASE) 50 MCG/ACT nasal spray USE 2 SPRAYS IN BOTH  NOSTRILS DAILY 48 g 1  ? hydrochlorothiazide (HYDRODIURIL) 12.5 MG tablet Take 1 tablet (12.5 mg total) by mouth daily. 90 tablet 3  ? levothyroxine (SYNTHROID) 50 MCG tablet TAKE 1 TABLET BY MOUTH  DAILY BEFORE BREAKFAST 90 tablet 3  ? MAGNESIUM PO Take by mouth.    ?  Olopatadine HCl (PATADAY) 0.2 % SOLN Apply 1 drop to eye daily. 2.5 mL 3  ? predniSONE (STERAPRED UNI-PAK 21 TAB) 10 MG (21) TBPK tablet Take per package instructions. Do not skip doses. Finish entire supply. 1 each 0  ? PULMICORT FLEXHALER 180 MCG/ACT inhaler INHALE 2 PUFFS INTO THE LUNGS IN THE MORNING AND AT BEDTIME. 1 each 2  ? rosuvastatin (CRESTOR) 5 MG tablet TAKE 1 TABLET BY MOUTH  DAILY 90 tablet 3  ? sertraline (ZOLOFT) 25 MG  tablet TAKE 1 TABLET BY MOUTH  DAILY 90 tablet 3  ? VITAMIN D PO Take 2,000 Int'l Units by mouth.    ? ?No current facility-administered medications for this visit.  ? ? ?Allergies  ?Allergen Reactions  ? Lisinopril Cough  ? Oxycodone-Acetaminophen Nausea Only  ? ? ?Social History  ? ?Socioeconomic History  ? Marital status: Married  ?  Spouse name: Not on file  ? Number of children: 3  ? Years of education: Not on file  ? Highest education level: Not on file  ?Occupational History  ? Not on file  ?Tobacco Use  ? Smoking status: Former  ?  Packs/day: 0.50  ?  Years: 25.00  ?  Pack years: 12.50  ?  Types: Cigarettes  ?  Quit date: 02/25/2005  ?  Years since quitting: 16.5  ? Smokeless tobacco: Never  ?Vaping Use  ? Vaping Use: Never used  ?Substance and Sexual Activity  ? Alcohol use: Yes  ?  Alcohol/week: 5.0 standard drinks  ?  Types: 5 Glasses of wine per week  ? Drug use: No  ? Sexual activity: Not Currently  ?  Partners: Male  ?  Birth control/protection: Surgical  ?  Comment: BTL  ?Other Topics Concern  ? Not on file  ?Social History Narrative  ? Not on file  ? ?Social Determinants of Health  ? ?Financial Resource Strain: Not on file  ?Food Insecurity: Not on file  ?Transportation Needs: Not on file  ?Physical Activity: Not on file  ?Stress: Not on file  ?Social Connections: Not on file  ?Intimate Partner Violence: Not on file  ? ? ?ROS ?Per hpi  ? ?Objective  ? ?Vitals as reported by the patient: ?Today's Vitals  ? 08/28/21 1254  ?BP: 120/69  ?Weight: 130 lb (59 kg)  ? ? ?[name] was seen today for cough. ? ?Diagnoses and all orders for this visit: ? ?Acute URI ?-     doxycycline (VIBRA-TABS) 100 MG tablet; Take 1 tablet (100 mg total) by mouth 2 (two) times daily. ?-     predniSONE (STERAPRED UNI-PAK 21 TAB) 10 MG (21) TBPK tablet; Take per package instructions. Do not skip doses. Finish entire supply. ? ? ? ?PLAN ?Given persistent symptoms x 2 mo, will give course of doxycycline and prednisone ?Continue other  current meds ?Return if worsening or failing to improve ?Patient encouraged to call clinic with any questions, comments, or concerns. ? ? ?I discussed the assessment and treatment plan with the patient. The patient was provided an opportunity to ask questions and all were answered. The patient agreed with the plan and demonstrated an understanding of the instructions. ?  ?The patient was advised to call back or seek an in-person evaluation if the symptoms worsen or if the condition fails to improve as anticipated. ? ?I provided 16 minutes of non-face-to-face time during this encounter. ? ?Janeece Agee, NP ? ? ?

## 2021-10-08 ENCOUNTER — Encounter: Payer: Self-pay | Admitting: Family Medicine

## 2021-10-08 ENCOUNTER — Ambulatory Visit (INDEPENDENT_AMBULATORY_CARE_PROVIDER_SITE_OTHER): Payer: 59 | Admitting: Family Medicine

## 2021-10-08 VITALS — BP 120/76 | HR 74 | Temp 98.1°F | Resp 16 | Wt 131.2 lb

## 2021-10-08 DIAGNOSIS — I10 Essential (primary) hypertension: Secondary | ICD-10-CM | POA: Diagnosis not present

## 2021-10-08 DIAGNOSIS — E785 Hyperlipidemia, unspecified: Secondary | ICD-10-CM

## 2021-10-08 DIAGNOSIS — I700 Atherosclerosis of aorta: Secondary | ICD-10-CM

## 2021-10-08 DIAGNOSIS — E039 Hypothyroidism, unspecified: Secondary | ICD-10-CM

## 2021-10-08 DIAGNOSIS — M2550 Pain in unspecified joint: Secondary | ICD-10-CM

## 2021-10-08 LAB — CBC WITH DIFFERENTIAL/PLATELET
Basophils Absolute: 0.1 10*3/uL (ref 0.0–0.1)
Basophils Relative: 1.1 % (ref 0.0–3.0)
Eosinophils Absolute: 0.3 10*3/uL (ref 0.0–0.7)
Eosinophils Relative: 4.1 % (ref 0.0–5.0)
HCT: 43.1 % (ref 36.0–46.0)
Hemoglobin: 14.5 g/dL (ref 12.0–15.0)
Lymphocytes Relative: 19.4 % (ref 12.0–46.0)
Lymphs Abs: 1.3 10*3/uL (ref 0.7–4.0)
MCHC: 33.8 g/dL (ref 30.0–36.0)
MCV: 94.1 fl (ref 78.0–100.0)
Monocytes Absolute: 0.6 10*3/uL (ref 0.1–1.0)
Monocytes Relative: 8.6 % (ref 3.0–12.0)
Neutro Abs: 4.4 10*3/uL (ref 1.4–7.7)
Neutrophils Relative %: 66.8 % (ref 43.0–77.0)
Platelets: 232 10*3/uL (ref 150.0–400.0)
RBC: 4.58 Mil/uL (ref 3.87–5.11)
RDW: 13.1 % (ref 11.5–15.5)
WBC: 6.6 10*3/uL (ref 4.0–10.5)

## 2021-10-08 LAB — LIPID PANEL
Cholesterol: 196 mg/dL (ref 0–200)
HDL: 64.9 mg/dL (ref >39.00)
LDL Cholesterol: 106 mg/dL — ABNORMAL HIGH (ref 0–99)
NonHDL: 131.22
Total CHOL/HDL Ratio: 3
Triglycerides: 127 mg/dL (ref 0.0–149.0)
VLDL: 25.4 mg/dL (ref 0.0–40.0)

## 2021-10-08 LAB — HEPATIC FUNCTION PANEL
ALT: 13 U/L (ref 0–35)
AST: 22 U/L (ref 0–37)
Albumin: 4.3 g/dL (ref 3.5–5.2)
Alkaline Phosphatase: 61 U/L (ref 39–117)
Bilirubin, Direct: 0.1 mg/dL (ref 0.0–0.3)
Total Bilirubin: 0.6 mg/dL (ref 0.2–1.2)
Total Protein: 6.7 g/dL (ref 6.0–8.3)

## 2021-10-08 LAB — BASIC METABOLIC PANEL
BUN: 12 mg/dL (ref 6–23)
CO2: 31 mEq/L (ref 19–32)
Calcium: 9.5 mg/dL (ref 8.4–10.5)
Chloride: 103 mEq/L (ref 96–112)
Creatinine, Ser: 0.8 mg/dL (ref 0.40–1.20)
GFR: 77.43 mL/min (ref >60.00)
Glucose, Bld: 110 mg/dL — ABNORMAL HIGH (ref 70–99)
Potassium: 3.6 mEq/L (ref 3.5–5.1)
Sodium: 141 mEq/L (ref 135–145)

## 2021-10-08 LAB — TSH: TSH: 2.68 u[IU]/mL (ref 0.35–5.50)

## 2021-10-08 NOTE — Assessment & Plan Note (Signed)
Pt is doing risk reduction w/ statin and BP control. ?

## 2021-10-08 NOTE — Assessment & Plan Note (Signed)
Chronic problem.  Well controlled on HCTZ daily.  Check labs since it's a diuretic but no anticipated med changes.  Will follow. ?

## 2021-10-08 NOTE — Assessment & Plan Note (Signed)
Chronic problem.  Currently asymptomatic on Levothyroxine 50mcg daily.  Check labs.  Adjust meds prn  

## 2021-10-08 NOTE — Assessment & Plan Note (Signed)
Chronic problem.  Currently tolerating Crestor 5mg daily w/o difficulty.  Check labs.  Adjust meds prn  

## 2021-10-08 NOTE — Patient Instructions (Signed)
Schedule your complete physical in 6 months ?We'll notify you of your lab results and make any changes if needed ?Keep up the good work on healthy diet and regular exercise- you look great!!! ?We'll call you with your Rheumatology appt ?Call with any questions or concerns ?Stay Safe!  Stay Healthy! ?Happy Spring!!! ?

## 2021-10-08 NOTE — Progress Notes (Signed)
? ?  Subjective:  ? ? Patient ID: Sonia Kim, female    DOB: May 21, 1956, 66 y.o.   MRN: [id number] ? ?HPI ?HTN- chronic problem, on HCTZ 12.5mg  daily w/ good control.  No CP, SOB, HAs, visual changes, edema. ? ?Hyperlipidemia- chronic problem.  On Crestor 5mg  daily.  No abd pain, N/V.  Pt continues to exercise regularly ? ?Hypothyroid- chronic problem, on Levothyroxine daily.  Pt reports energy level is 'pretty good'.  Denies changes to skin/hair/nails. ? ?Polyarthralgia- pt reports multiple joints are regularly hurting.  Hands, shoulders, back.  Some relief w/ Advil.  Pt reports she can 'barely open my hands in the morning'.  Pt reports red, swollen joints.   ? ? ?Review of Systems ?For ROS see HPI  ?   ?Objective:  ? Physical Exam ?Vitals reviewed.  ?Constitutional:   ?   General: She is not in acute distress. ?   Appearance: Normal appearance. She is well-developed. She is not ill-appearing.  ?HENT:  ?   Head: Normocephalic and atraumatic.  ?Eyes:  ?   Conjunctiva/sclera: Conjunctivae normal.  ?   Pupils: Pupils are equal, round, and reactive to light.  ?Neck:  ?   Thyroid: No thyromegaly.  ?Cardiovascular:  ?   Rate and Rhythm: Normal rate and regular rhythm.  ?   Pulses: Normal pulses.  ?   Heart sounds: Normal heart sounds. No murmur heard. ?Pulmonary:  ?   Effort: Pulmonary effort is normal. No respiratory distress.  ?   Breath sounds: Normal breath sounds.  ?Abdominal:  ?   General: There is no distension.  ?   Palpations: Abdomen is soft.  ?   Tenderness: There is no abdominal tenderness.  ?Musculoskeletal:  ?   Cervical back: Normal range of motion and neck supple.  ?   Right lower leg: No edema.  ?   Left lower leg: No edema.  ?Lymphadenopathy:  ?   Cervical: No cervical adenopathy.  ?Skin: ?   General: Skin is warm and dry.  ?Neurological:  ?   Mental Status: She is alert and oriented to person, place, and time.  ?Psychiatric:     ?   Behavior: Behavior normal.  ? ? ? ? ? ?   ?Assessment &  Plan:  ? ?Polyarthralgia- pt reports bilateral and symmetric pain of hands and shoulders, as well as neck and back.  She states pain in her hands is worse in the morning.  Joints will become red and swollen at times.  Check ANA and RF and refer to Rheumatology for complete evaluation.  Pt expressed understanding and is in agreement w/ plan.  ?

## 2021-10-10 LAB — ANA: Anti Nuclear Antibody (ANA): NEGATIVE

## 2021-10-10 LAB — RHEUMATOID FACTOR: Rheumatoid fact SerPl-aCnc: <14 IU/mL (ref <14)

## 2021-10-11 ENCOUNTER — Telehealth: Payer: Self-pay

## 2021-10-11 NOTE — Telephone Encounter (Signed)
-----   Message from Katherine E Tabori, MD sent at 10/10/2021  3:45 PM EDT ----- ?Thankfully ANA and rheumatoid factor are both negative.  But we will see what Rheumatology says at your upcoming appt ?

## 2021-10-11 NOTE — Telephone Encounter (Signed)
Left pt a Vm asking her to call office back in regards to lab results  ?

## 2021-10-11 NOTE — Telephone Encounter (Signed)
Left pt a VM to call office in regards to lab results  

## 2021-10-11 NOTE — Telephone Encounter (Signed)
-----   Message from Sheliah Hatch, MD sent at 10/10/2021  3:45 PM EDT ----- ?Thankfully ANA and rheumatoid factor are both negative.  But we will see what Rheumatology says at your upcoming appt ?

## 2021-10-12 ENCOUNTER — Telehealth: Payer: Self-pay

## 2021-10-12 NOTE — Telephone Encounter (Signed)
-----   Message from Katherine E Tabori, MD sent at 10/10/2021  3:45 PM EDT ----- ?Thankfully ANA and rheumatoid factor are both negative.  But we will see what Rheumatology says at your upcoming appt ?

## 2021-10-15 ENCOUNTER — Telehealth: Payer: Self-pay

## 2021-10-15 NOTE — Telephone Encounter (Signed)
Pt returned phone, please call her back  at [phone]  ?

## 2021-10-15 NOTE — Telephone Encounter (Signed)
Spoke to pt and advised her of her lab results . She had seen them on mychart as well  ?

## 2021-10-16 NOTE — Telephone Encounter (Signed)
Spoke w/ pt . Advised of lab results  

## 2021-10-21 ENCOUNTER — Encounter (HOSPITAL_BASED_OUTPATIENT_CLINIC_OR_DEPARTMENT_OTHER): Payer: Self-pay | Admitting: Obstetrics and Gynecology

## 2021-10-21 ENCOUNTER — Other Ambulatory Visit: Payer: Self-pay

## 2021-10-21 ENCOUNTER — Emergency Department (HOSPITAL_BASED_OUTPATIENT_CLINIC_OR_DEPARTMENT_OTHER)
Admission: EM | Admit: 2021-10-21 | Discharge: 2021-10-21 | Disposition: A | Discharge status: Home / Self Care | Payer: 59 | Attending: Emergency Medicine | Admitting: Emergency Medicine

## 2021-10-21 ENCOUNTER — Emergency Department (HOSPITAL_BASED_OUTPATIENT_CLINIC_OR_DEPARTMENT_OTHER): Payer: 59 | Admitting: Radiology

## 2021-10-21 DIAGNOSIS — M545 Low back pain, unspecified: Secondary | ICD-10-CM

## 2021-10-21 MED ORDER — TRAMADOL HCL 50 MG PO TABS: 50.0000 mg | ORAL_TABLET | Freq: Four times a day (QID) | ORAL | 0 refills | Status: DC | PRN

## 2021-10-21 MED ORDER — PREDNISONE 20 MG PO TABS: ORAL_TABLET | ORAL | 0 refills | Status: DC

## 2021-10-21 NOTE — Discharge Instructions (Addendum)
Use the Lidoderm patches and Voltaren gel for symptomatic relief.  Do not take ibuprofen or Aleve while taking the prednisone.  Follow-up with either your primary care doctor or orthopedist.  Return to the emergency room if you have any worsening symptoms. ?

## 2021-10-21 NOTE — ED Triage Notes (Signed)
Patient reports lumbar region back pain. She reports no pain running down her leg. Denies urinary symptoms. Reports pain with positional changes with decreased pain lying flat.  ? ?

## 2021-10-21 NOTE — ED Provider Notes (Signed)
?[facility] EM[facility] DEPT ?Provider Note ? ? ?CSN: [id number] ?Arrival date & time: 10/21/21  1308 ? ?  ? ?History ? ?Chief Complaint  ?Patient presents with  ? Back Pain  ? ? ?Sonia Kim is a 66 y.o. female. ? ?Patient is a 66 year old who presents with back pain.  She has pain in her left lower back.  Its been going on for about 2 to 3 days.  There is no radiation down her legs.  No numbness or weakness to her extremities.  No loss of bowel or bladder control.  She has had some back pain in the past and been seen by [facility].  She said the day before it began she did have a jolting type injury when her dog ran into her.  She has not had any other injuries.  She said the pain is worse with certain movements and twisting motions. ? ? ?  ? ?Home Medications ?Prior to Admission medications   ?Medication Sig Start Date End Date Taking? Authorizing Provider  ?predniSONE (DELTASONE) 20 MG tablet 3 tabs po day one, then 2 po daily x 4 days 10/21/21  Yes Rolan Bucco, MD  ?traMADol (ULTRAM) 50 MG tablet Take 1 tablet (50 mg total) by mouth every 6 (six) hours as needed. 10/21/21  Yes Rolan Bucco, MD  ?albuterol (VENTOLIN HFA) 108 (90 Base) MCG/ACT inhaler INHALE 2 PUFFS BY MOUTH EVERY 6 HOURS AS NEEDED FOR WHEEZE OR SHORTNESS OF BREATH 10/04/20   Sheliah Hatch, MD  ?fluticasone [facility]) 50 MCG/ACT nasal spray USE 2 SPRAYS IN BOTH  NOSTRILS DAILY 07/05/21   Sheliah Hatch, MD  ?hydrochlorothiazide (HYDRODIURIL) 12.5 MG tablet Take 1 tablet (12.5 mg total) by mouth daily. 05/01/21   Sheliah Hatch, MD  ?levothyroxine (SYNTHROID) 50 MCG tablet TAKE 1 TABLET BY MOUTH  DAILY BEFORE BREAKFAST 03/14/21   Sheliah Hatch, MD  ?MAGNESIUM PO Take by mouth.    [provider]  ?Olopatadine HCl (PATADAY) 0.2 % SOLN Apply 1 drop to eye daily. 07/05/21   Sheliah Hatch, MD  ?OVER THE COUNTER MEDICATION Collagen with biotin    [provider]  ?Steffanie Rainwater 180  MCG/ACT inhaler INHALE 2 PUFFS INTO THE LUNGS IN THE MORNING AND AT BEDTIME. 04/07/20   Sheliah Hatch, MD  ?rosuvastatin (CRESTOR) 5 MG tablet TAKE 1 TABLET BY MOUTH  DAILY 03/14/21   Sheliah Hatch, MD  ?sertraline (ZOLOFT) 25 MG tablet TAKE 1 TABLET BY MOUTH  DAILY 02/21/21   Sheliah Hatch, MD  ?VITAMIN D PO Take 2,000 Int'l Units by mouth.    [provider]  ?   ? ?Allergies    ?Lisinopril and Oxycodone-acetaminophen   ? ?Review of Systems   ?Review of Systems  ?Constitutional:  Negative for fever.  ?Gastrointestinal:  Negative for nausea and vomiting.  ?Musculoskeletal:  Positive for back pain. Negative for arthralgias, joint swelling and neck pain.  ?Skin:  Negative for wound.  ?Neurological:  Negative for weakness, numbness and headaches.  ? ?Physical Exam ?Updated Vital Signs ?BP (!) 149/67   Pulse 62   Temp 98.5 ?F (36.9 ?C)   Resp 18   Ht 5\' 2"  (1.575 m)   Wt 58.1 kg   LMP 03/10/2010   SpO2 98%   BMI 23.41 kg/m?  ?Physical Exam ?Constitutional:   ?   Appearance: She is well-developed.  ?HENT:  ?   Head: Normocephalic and atraumatic.  ?Cardiovascular:  ?   Rate  and Rhythm: Normal rate.  ?Pulmonary:  ?   Effort: Pulmonary effort is normal.  ?Musculoskeletal:     ?   General: Tenderness present.  ?   Cervical back: Normal range of motion and neck supple.  ?   Comments: Positive tenderness in the left lower lumbar paraspinal area.  Negative straight leg raise bilaterally.  Patellar reflexes symmetric bilaterally.  She has normal sensation and motor function to lower extremities.  Pedal pulses are intact.  ?Skin: ?   General: Skin is warm and dry.  ?Neurological:  ?   Mental Status: She is alert and oriented to person, place, and time.  ? ? ?ED Results / Procedures / Treatments   ?Labs ?(all labs ordered are listed, but only abnormal results are displayed) ?Labs Reviewed - No data to display ? ?EKG ?None ? ?Radiology ?DG Lumbar Spine Complete ? ?Result Date: 10/21/2021 ?CLINICAL  DATA:  Back pain. EXAM: LUMBAR SPINE - COMPLETE 4+ VIEW COMPARISON:  04/09/2016 FINDINGS: Progressive degenerative anterolisthesis of L3. No pars defects are identified. The other lumbar vertebral bodies are normally aligned. No acute bony findings or destructive bony changes. The visualized bony pelvis is intact and the SI joints appear normal. Progressive aortic and iliac artery calcifications without definite aneurysm. IMPRESSION: 1. Progressive degenerative anterolisthesis of L3. 2. No acute bony findings or destructive bony changes. 3. Progressive aortic and iliac artery calcifications. Electronically Signed   By: Rudie Meyer M.D.   On: 10/21/2021 11:41   ? ?Procedures ?Procedures  ? ? ?Medications Ordered in ED ?Medications - No data to display ? ?ED Course/ Medical Decision Making/ A&P ?  ?                        ?Medical Decision Making ?Amount and/or Complexity of Data Reviewed ?Radiology: ordered. ? ?Risk ?Prescription drug management. ? ? ?Patient is 66 year old who presents with back pain.  X-rays were obtained.  These are interpreted by me.  There is no acute bony abnormality.  There are some calcifications of her aorta which were explained to the patient.  She will follow-up with her PCP regarding this.  She has no neurologic deficits.  No signs of cauda equina.  No fevers or concerns for infection.  She was discharged home in good condition.  She declines any pain medication in the ED.  She was started on a prednisone pack and was given a prescription for tramadol if she needed it.  She does not want take any stronger medications.  She will continue using Lidoderm patches and Voltaren gel that she has at home.  She will follow-up with either her primary care doctor or orthopedist. ? ?Final Clinical Impression(s) / ED Diagnoses ?Final diagnoses:  ?Acute left-sided low back pain without sciatica  ? ? ?Rx / DC Orders ?ED Discharge Orders   ? ?      Ordered  ?  traMADol (ULTRAM) 50 MG tablet  Every 6  hours PRN       ? 10/21/21 1224  ?  predniSONE (DELTASONE) 20 MG tablet       ? 10/21/21 1224  ? ?  ?  ? ?  ? ? ?  ?Rolan Bucco, MD ?10/21/21 1227 ? ?

## 2021-11-13 ENCOUNTER — Other Ambulatory Visit: Payer: Self-pay | Admitting: Family Medicine

## 2022-01-16 ENCOUNTER — Other Ambulatory Visit: Payer: Self-pay | Admitting: Family Medicine

## 2022-01-16 NOTE — Telephone Encounter (Signed)
Patient is requesting a refill of the following medications: Requested Prescriptions   Pending Prescriptions Disp Refills   sertraline (ZOLOFT) 25 MG tablet [Pharmacy Med Name: Sertraline HCl 25 MG Oral Tablet] 90 tablet 3    Sig: TAKE 1 TABLET BY MOUTH  DAILY    Date of patient request: 01/16/2022 Last office visit: 10/08/2021 Date of last refill: 02/21/2021 Last refill amount: 90 tablets 3 refills  Follow up time period per chart: 03/13/2022

## 2022-01-17 ENCOUNTER — Ambulatory Visit: Payer: 59 | Admitting: Internal Medicine

## 2022-01-21 ENCOUNTER — Ambulatory Visit (HOSPITAL_BASED_OUTPATIENT_CLINIC_OR_DEPARTMENT_OTHER): Payer: 59

## 2022-01-24 ENCOUNTER — Ambulatory Visit (HOSPITAL_BASED_OUTPATIENT_CLINIC_OR_DEPARTMENT_OTHER)
Admission: RE | Admit: 2022-01-24 | Discharge: 2022-01-24 | Disposition: A | Discharge status: Home / Self Care | Payer: 59 | Source: Ambulatory Visit | Attending: Obstetrics & Gynecology | Admitting: Obstetrics & Gynecology

## 2022-01-24 DIAGNOSIS — E2839 Other primary ovarian failure: Secondary | ICD-10-CM

## 2022-02-01 ENCOUNTER — Ambulatory Visit: Payer: 59 | Admitting: Family Medicine

## 2022-02-14 ENCOUNTER — Other Ambulatory Visit: Payer: Self-pay | Admitting: Family Medicine

## 2022-03-04 ENCOUNTER — Telehealth: Payer: Self-pay | Admitting: Family Medicine

## 2022-03-04 ENCOUNTER — Other Ambulatory Visit: Payer: Self-pay | Admitting: Family Medicine

## 2022-03-04 NOTE — Telephone Encounter (Signed)
Pt went to [facility] on [address], was given a nebulizer, refill pulmocourt, and was given steroid injection to assist. Also was given azithromycin for 5 days due to husband having bronchitis and recent travel, notes she has not started azythromycin yet as she wasn't sure she needed to wanted your advice should she start this notes she is already feeling much better post injection and nebulizer.

## 2022-03-04 NOTE — Telephone Encounter (Signed)
If she is feeling better she does not need to take the antibiotics

## 2022-03-04 NOTE — Telephone Encounter (Signed)
Chief Complaint BREATHING - shortness of breath or sounds breathless Reason for Call Symptomatic / Request for Sonia Kim states she is having difficulty breathing and unable to sleep. Translation No Nurse Assessment Nurse: Ronnald Ramp, RN, Miranda Date/Time (Eastern Time): 03/03/2022 12:57:53 PM Confirm and document reason for call. If symptomatic, describe symptoms. ---Caller states she has asthma and having trouble breathing. She feels like there is a something in her airway that she is having trouble clearing out of her airway. She is wheezing. Albuterol 4 times in 6 hrs. Does the patient have any new or worsening symptoms? ---Yes Will a triage be completed? ---Yes Related visit to physician within the last 2 weeks? ---No Does the PT have any chronic conditions? (i.e. diabetes, asthma, this includes High risk factors for pregnancy, etc.) ---Yes List chronic conditions. ---Asthma, HTN, Thyroid Is this a behavioral health or substance abuse call? ---No Guidelines Guideline Title Affirmed Question Affirmed Notes Nurse Date/Time (Eastern Time) Asthma Attack Quick-relief asthma medicine (e.g., albuterol /salbutamol, levalbuterol by inhaler or nebulizer) is needed more Ronnald Ramp, RN, Miranda 03/03/2022 1:00:58 PM PLEASE NOTE: All timestamps contained within this report are represented as [place] Standard Time. CONFIDENTIALTY NOTICE: This fax transmission is intended only for the addressee. It contains information that is legally privileged, confidential or otherwise protected from use or disclosure. If you are not the intended recipient, you are strictly prohibited from reviewing, disclosing, copying using or disseminating any of this information or taking any action in reliance on or regarding this information. If you have received this fax in error, please notify us immediately by telephone so that we can arrange for its return to [place]. Phone: [phone],  Toll-Free: [phone], Fax: [phone] Page: 2 of 2 Call Id: [id number] Guidelines Guideline Title Affirmed Question Affirmed Notes Nurse Date/Time Sonia Kim Time) frequently than every 4 hours to keep you comfortable Disp. Time Sonia Kim Time) Disposition Final User 03/03/2022 12:56:40 PM Send to Urgent Queue Paulita Cradle 03/03/2022 1:07:23 PM Go to ED Now (or PCP triage) Yes Ronnald Ramp, RN, Miranda Final Disposition 03/03/2022 1:07:23 PM Go to ED Now (or PCP triage) Yes Ronnald Ramp, RN, Miranda Caller Disagree/Comply Comply Caller Understands Yes PreDisposition Did not know what to do Care Advice Given Per Guideline GO TO ED NOW (OR PCP TRIAGE): * IF NO PCP (PRIMARY CARE PROVIDER) SECOND-LEVEL TRIAGE: You need to be seen within the next hour. Go to the [facility] at _____________ [place] as soon as you can. ASTHMA ATTACK - TREATMENT - QUICK-RELIEF MEDICINE: * If you have not used your inhaler or nebulizer in the PAST 20 MINUTES, take 2 to 4 puffs of your quick-relief ALBUTEROL (SALBUTAMOL) INHALER right now. CARE ADVICE given per Asthma Attac

## 2022-03-04 NOTE — Telephone Encounter (Signed)
Called pt back no answer LM advising not to start med

## 2022-03-13 ENCOUNTER — Encounter: Payer: Self-pay | Admitting: Family Medicine

## 2022-03-13 ENCOUNTER — Telehealth: Payer: Self-pay | Admitting: Family Medicine

## 2022-03-13 ENCOUNTER — Ambulatory Visit (INDEPENDENT_AMBULATORY_CARE_PROVIDER_SITE_OTHER): Payer: 59 | Admitting: Family Medicine

## 2022-03-13 VITALS — BP 116/68 | HR 68 | Temp 97.4°F | Resp 16 | Ht 62.0 in | Wt 123.5 lb

## 2022-03-13 DIAGNOSIS — Z23 Encounter for immunization: Secondary | ICD-10-CM

## 2022-03-13 DIAGNOSIS — Z0000 Encounter for general adult medical examination without abnormal findings: Secondary | ICD-10-CM | POA: Diagnosis not present

## 2022-03-13 DIAGNOSIS — E559 Vitamin D deficiency, unspecified: Secondary | ICD-10-CM

## 2022-03-13 DIAGNOSIS — I10 Essential (primary) hypertension: Secondary | ICD-10-CM

## 2022-03-13 DIAGNOSIS — E041 Nontoxic single thyroid nodule: Secondary | ICD-10-CM

## 2022-03-13 LAB — BASIC METABOLIC PANEL
BUN: 12 mg/dL (ref 6–23)
CO2: 31 mEq/L (ref 19–32)
Calcium: 9.4 mg/dL (ref 8.4–10.5)
Chloride: 102 mEq/L (ref 96–112)
Creatinine, Ser: 0.69 mg/dL (ref 0.40–1.20)
GFR: 90.93 mL/min (ref >60.00)
Glucose, Bld: 90 mg/dL (ref 70–99)
Potassium: 4 mEq/L (ref 3.5–5.1)
Sodium: 140 mEq/L (ref 135–145)

## 2022-03-13 LAB — HEPATIC FUNCTION PANEL
ALT: 11 U/L (ref 0–35)
AST: 22 U/L (ref 0–37)
Albumin: 4.3 g/dL (ref 3.5–5.2)
Alkaline Phosphatase: 59 U/L (ref 39–117)
Bilirubin, Direct: 0.1 mg/dL (ref 0.0–0.3)
Total Bilirubin: 0.6 mg/dL (ref 0.2–1.2)
Total Protein: 6.7 g/dL (ref 6.0–8.3)

## 2022-03-13 LAB — CBC WITH DIFFERENTIAL/PLATELET
Basophils Absolute: 0.1 10*3/uL (ref 0.0–0.1)
Basophils Relative: 1.3 % (ref 0.0–3.0)
Eosinophils Absolute: 1.2 10*3/uL — ABNORMAL HIGH (ref 0.0–0.7)
Eosinophils Relative: 16.4 % — ABNORMAL HIGH (ref 0.0–5.0)
HCT: 42.6 % (ref 36.0–46.0)
Hemoglobin: 14.4 g/dL (ref 12.0–15.0)
Lymphocytes Relative: 21.9 % (ref 12.0–46.0)
Lymphs Abs: 1.6 10*3/uL (ref 0.7–4.0)
MCHC: 33.7 g/dL (ref 30.0–36.0)
MCV: 93.7 fl (ref 78.0–100.0)
Monocytes Absolute: 0.6 10*3/uL (ref 0.1–1.0)
Monocytes Relative: 7.7 % (ref 3.0–12.0)
Neutro Abs: 3.8 10*3/uL (ref 1.4–7.7)
Neutrophils Relative %: 52.7 % (ref 43.0–77.0)
Platelets: 241 10*3/uL (ref 150.0–400.0)
RBC: 4.54 Mil/uL (ref 3.87–5.11)
RDW: 12.2 % (ref 11.5–15.5)
WBC: 7.3 10*3/uL (ref 4.0–10.5)

## 2022-03-13 LAB — LIPID PANEL
Cholesterol: 177 mg/dL (ref 0–200)
HDL: 61.8 mg/dL (ref >39.00)
LDL Cholesterol: 97 mg/dL (ref 0–99)
NonHDL: 114.96
Total CHOL/HDL Ratio: 3
Triglycerides: 92 mg/dL (ref 0.0–149.0)
VLDL: 18.4 mg/dL (ref 0.0–40.0)

## 2022-03-13 LAB — VITAMIN D 25 HYDROXY (VIT D DEFICIENCY, FRACTURES): VITD: 67.04 ng/mL (ref 30.00–100.00)

## 2022-03-13 LAB — TSH: TSH: 1.89 u[IU]/mL (ref 0.35–5.50)

## 2022-03-13 NOTE — Patient Instructions (Addendum)
Follow up in 6 months to recheck BP and cholesterol We'll notify you of your lab results and make any changes if needed Keep up the good work on healthy diet and regular exercise- you look great!! Take the Zpack- it's been long enough! Continue to use your inhalers Call with any questions or concerns Stay Safe!  Stay Healthy! Happy Fall!!!

## 2022-03-13 NOTE — Assessment & Plan Note (Signed)
Pt's PE WNL w/ exception of new, small L sided thyroid nodule ([place] entered).  UTD on Tdap, pap, colonoscopy.  Mammo scheduled for later this week.  Flu shot given.  Check labs.  Anticipatory guidance provided.

## 2022-03-13 NOTE — Telephone Encounter (Signed)
Dropped off Physician results form to be filled out. Placed in front bin.

## 2022-03-13 NOTE — Progress Notes (Signed)
Informed pt of lab results  

## 2022-03-13 NOTE — Assessment & Plan Note (Signed)
Check labs and replete prn. 

## 2022-03-13 NOTE — Telephone Encounter (Signed)
Informed pt that I have faxed form and placed in scan

## 2022-03-13 NOTE — Progress Notes (Signed)
   Subjective:    Patient ID: Sonia Kim, female    DOB: [birth year], 66 y.o.   MRN: [id number]  HPI CPE- UTD on Tdap, pap, colonoscopy.  Due for mammo- scheduled.  Will get flu shot today.  Patient Care Team    Relationship Specialty Notifications Start End  Midge Minium, MD PCP - General Family Medicine  08/14/16   Lyndal Pulley, DO Consulting Physician Sports Medicine  06/19/16   Sueanne Margarita, MD Consulting Physician Cardiology  06/19/16   Juanita Craver, MD Consulting Physician Gastroenterology  08/14/16   Kem Boroughs, [place] Nurse Practitioner Family Medicine  08/14/16     Health Maintenance  Topic Date Due   INFLUENZA VACCINE  01/08/2022   MAMMOGRAM  03/05/2022   COVID-19 Vaccine (4 - Pfizer risk series) 03/29/2022 (Originally 06/01/2021)   HIV Screening  04/06/2022 (Originally 07/21/1970)   Pneumonia Vaccine 72+ Years old (1 - PCV) 03/14/2023 (Originally 07/20/2021)   TETANUS/TDAP  09/12/2023   PAP SMEAR-Modifier  03/12/2024   COLONOSCOPY (Pts 45-25yrs Insurance coverage will need to be confirmed)  02/16/2026   DEXA SCAN  Completed   Hepatitis C Screening  Completed   Zoster Vaccines- Shingrix  Completed   HPV VACCINES  Aged Out      Review of Systems Patient reports no vision/ hearing changes, adenopathy,fever, weight change,  persistant/recurrent hoarseness , swallowing issues, chest pain, palpitations, edema, persistant/recurrent cough, hemoptysis, dyspnea (rest/exertional/paroxysmal nocturnal), gastrointestinal bleeding (melena, rectal bleeding), abdominal pain, significant heartburn, bowel changes, GU symptoms (dysuria, hematuria, incontinence), Gyn symptoms (abnormal  bleeding, pain),  syncope, focal weakness, memory loss, numbness & tingling, skin/hair/nail changes, abnormal bruising or bleeding, anxiety, or depression.     Objective:   Physical Exam General Appearance:    Alert, cooperative, no distress, appears stated age  Head:    Normocephalic,  without obvious abnormality, atraumatic  Eyes:    PERRL, conjunctiva/corneas clear, EOM's intact, fundi    benign, both eyes  Ears:    Normal TM's and external ear canals, both ears  Nose:   Nares normal, septum midline, mucosa normal, no drainage    or sinus tenderness  Throat:   Lips, mucosa, and tongue normal; teeth and gums normal  Neck:   Supple, symmetrical, trachea midline, no adenopathy;    Thyroid: no enlargement/tenderness, small L sided nodule  Back:     Symmetric, no curvature, ROM normal, no CVA tenderness  Lungs:     Clear to auscultation bilaterally, respirations unlabored  Chest Wall:    No tenderness or deformity   Heart:    Regular rate and rhythm, S1 and S2 normal, no murmur, rub   or gallop  Breast Exam:    Deferred to GYN  Abdomen:     Soft, non-tender, bowel sounds active all four quadrants,    no masses, no organomegaly  Genitalia:    Deferred to GYN  Rectal:    Extremities:   Extremities normal, atraumatic, no cyanosis or edema  Pulses:   2+ and symmetric all extremities  Skin:   Skin color, texture, turgor normal, no rashes or lesions  Lymph nodes:   Cervical, supraclavicular, and axillary nodes normal  Neurologic:   CNII-XII intact, normal strength, sensation and reflexes    throughout          Assessment & Plan:

## 2022-03-13 NOTE — Assessment & Plan Note (Signed)
Excellent control.  No med changes at this time

## 2022-03-13 NOTE — Telephone Encounter (Signed)
Placed in Dr Birdie Riddle to be signed folder . Once signed I will fax , place in scan and notify the pt

## 2022-03-13 NOTE — Progress Notes (Signed)
Called pt and informed her of her lab results on 03/13/22

## 2022-03-14 ENCOUNTER — Ambulatory Visit (HOSPITAL_BASED_OUTPATIENT_CLINIC_OR_DEPARTMENT_OTHER): Payer: 59 | Admitting: Obstetrics & Gynecology

## 2022-03-15 ENCOUNTER — Other Ambulatory Visit: Payer: Self-pay | Admitting: Family Medicine

## 2022-03-15 ENCOUNTER — Telehealth (INDEPENDENT_AMBULATORY_CARE_PROVIDER_SITE_OTHER): Payer: 59 | Admitting: Family

## 2022-03-15 ENCOUNTER — Ambulatory Visit (HOSPITAL_BASED_OUTPATIENT_CLINIC_OR_DEPARTMENT_OTHER)
Admission: RE | Admit: 2022-03-15 | Discharge: 2022-03-15 | Disposition: A | Discharge status: Home / Self Care | Payer: 59 | Source: Ambulatory Visit | Attending: Obstetrics & Gynecology | Admitting: Obstetrics & Gynecology

## 2022-03-15 ENCOUNTER — Encounter: Payer: Self-pay | Admitting: Family

## 2022-03-15 ENCOUNTER — Ambulatory Visit (HOSPITAL_BASED_OUTPATIENT_CLINIC_OR_DEPARTMENT_OTHER)
Admission: RE | Admit: 2022-03-15 | Discharge: 2022-03-15 | Disposition: A | Discharge status: Home / Self Care | Payer: 59 | Source: Ambulatory Visit | Attending: Family Medicine | Admitting: Family Medicine

## 2022-03-15 VITALS — BP 120/62 | Ht 62.0 in | Wt 123.0 lb

## 2022-03-15 DIAGNOSIS — E041 Nontoxic single thyroid nodule: Secondary | ICD-10-CM | POA: Insufficient documentation

## 2022-03-15 DIAGNOSIS — J209 Acute bronchitis, unspecified: Secondary | ICD-10-CM

## 2022-03-15 DIAGNOSIS — R221 Localized swelling, mass and lump, neck: Secondary | ICD-10-CM

## 2022-03-15 DIAGNOSIS — Z1231 Encounter for screening mammogram for malignant neoplasm of breast: Secondary | ICD-10-CM | POA: Diagnosis present

## 2022-03-15 MED ORDER — BENZONATATE 200 MG PO CAPS: 200.0000 mg | ORAL_CAPSULE | Freq: Three times a day (TID) | ORAL | 0 refills | Status: AC | PRN

## 2022-03-15 MED ORDER — PREDNISONE 10 MG PO TABS: ORAL_TABLET | ORAL | 0 refills | Status: DC

## 2022-03-15 NOTE — Progress Notes (Signed)
MyChart Video Visit    Virtual Visit via Video Note   This format is felt to be most appropriate for this patient at this time. Physical exam was limited by quality of the video and audio technology used for the visit. CMA was able to get the patient set up on a video visit.  Patient location: Home. Patient and provider in visit Provider location: Office  I discussed the limitations of evaluation and management by telemedicine and the availability of in person appointments. The patient expressed understanding and agreed to proceed.  Visit Date: 03/15/2022  Today's healthcare provider: Jeanie Sewer, NP     Subjective:   Patient ID: Sonia Kim, female    DOB: 1956/04/29, 66 y.o.   MRN: [id number]  Chief Complaint  Patient presents with   Cough    Pt c/o cough and chest congestion for about 2 weeks. Pt states she has a asthma. Has tried albuterol inhaler. Nothing is coming up when she coughs   HPI Persistent cough:  pt has asthma and has been using her albuterol inhaler and reports that her pulmicort inhaler was not working to get the mucus up,  seen at [facility] 9/25 and given Zpack, she did not take right away, just recently has takes 2 or 3 and feels no different.  Assessment & Plan:   Problem List Items Addressed This Visit   None Visit Diagnoses     Acute bronchitis, unspecified organism    -  Primary sending prednisone pack & tessalon pearles advised on use & SE, continue to use Albuterol, try the Pulmicort again, drink plenty of fluids. Call back if sx are not better next week.    Relevant Medications   benzonatate (TESSALON) 200 MG capsule   predniSONE (DELTASONE) 10 MG tablet      Past Medical History:  Diagnosis Date   Allergy    Dogs, dust, and mold   Anxiety    Arthritis    Benign essential HTN 08/31/2015   Chronic neck pain    Colon polyps    Depression    Heart murmur    Hypercholesteremia    Hyperlipidemia 05/2005    Hypothyroid 06/2009   Osteoarthritis of thoracic spine    Shingles 06/2011    Past Surgical History:  Procedure Laterality Date   TUBAL LIGATION  1984 & 05/2005   reversal in 1995   tubal reversal  1995    Outpatient Medications Prior to Visit  Medication Sig Dispense Refill   albuterol (VENTOLIN HFA) 108 (90 Base) MCG/ACT inhaler INHALE 2 PUFFS BY MOUTH EVERY 6 HOURS AS NEEDED FOR WHEEZE OR SHORTNESS OF BREATH 8.5 each 2   hydrochlorothiazide (HYDRODIURIL) 12.5 MG tablet TAKE 1 TABLET BY MOUTH DAILY 90 tablet 3   levothyroxine (SYNTHROID) 50 MCG tablet TAKE 1 TABLET BY MOUTH  DAILY BEFORE BREAKFAST 90 tablet 3   Olopatadine HCl (PATADAY) 0.2 % SOLN Apply 1 drop to eye daily. 2.5 mL 3   OVER THE COUNTER MEDICATION Collagen with biotin     PULMICORT FLEXHALER 180 MCG/ACT inhaler INHALE 2 PUFFS INTO THE LUNGS IN THE MORNING AND AT BEDTIME. 1 each 2   rosuvastatin (CRESTOR) 5 MG tablet TAKE 1 TABLET BY MOUTH  DAILY 90 tablet 3   sertraline (ZOLOFT) 25 MG tablet TAKE 1 TABLET BY MOUTH  DAILY 90 tablet 3   VITAMIN D PO Take 2,000 Int'l Units by mouth.     No facility-administered medications prior to visit.  Allergies  Allergen Reactions   Lisinopril Cough   Oxycodone-Acetaminophen Nausea Only       Objective:   Physical Exam Vitals and nursing note reviewed.  Constitutional:      General: Pt is not in acute distress.    Appearance: Normal appearance.  HENT:     Head: Normocephalic.  Pulmonary:     Effort: No respiratory distress.  Musculoskeletal:     Cervical back: Normal range of motion.  Skin:    General: Skin is dry.     Coloration: Skin is not pale.  Neurological:     Mental Status: Pt is alert and oriented to person, place, and time.  Psychiatric:        Mood and Affect: Mood normal.   BP 120/62 (BP Location: Left Arm, Patient Position: Sitting, Cuff Size: Large)   Ht 5\' 2"  (1.575 m)   Wt 123 lb (55.8 kg)   LMP 03/10/2010   BMI 22.50 kg/m   Wt  Readings from Last 3 Encounters:  03/15/22 123 lb (55.8 kg)  03/13/22 123 lb 8 oz (56 kg)  10/21/21 128 lb (58.1 kg)      I discussed the assessment and treatment plan with the patient. The patient was provided an opportunity to ask questions and all were answered. The patient agreed with the plan and demonstrated an understanding of the instructions.   The patient was advised to call back or seek an in-person evaluation if the symptoms worsen or if the condition fails to improve as anticipated.  10/23/21, NP [facility] [place] [phone] (phone) [phone] (fax)  [facility]

## 2022-03-18 NOTE — Progress Notes (Signed)
Pt seen results Via my chart and CT has been ordered by Dr Birdie Riddle

## 2022-03-27 ENCOUNTER — Encounter (HOSPITAL_BASED_OUTPATIENT_CLINIC_OR_DEPARTMENT_OTHER): Payer: Self-pay

## 2022-03-27 ENCOUNTER — Ambulatory Visit (HOSPITAL_BASED_OUTPATIENT_CLINIC_OR_DEPARTMENT_OTHER)
Admission: RE | Admit: 2022-03-27 | Discharge: 2022-03-27 | Disposition: A | Discharge status: Home / Self Care | Payer: 59 | Source: Ambulatory Visit | Attending: Family Medicine | Admitting: Family Medicine

## 2022-03-27 DIAGNOSIS — R221 Localized swelling, mass and lump, neck: Secondary | ICD-10-CM | POA: Diagnosis present

## 2022-03-27 MED ORDER — IOHEXOL 300 MG/ML  SOLN
100.0000 mL | Freq: Once | INTRAMUSCULAR | Status: AC | PRN
  Administered 2022-03-27: 75 mL via INTRAVENOUS

## 2022-03-29 NOTE — Progress Notes (Signed)
I informed pt of CT results as directed my Dr Birdie Riddle

## 2022-04-02 ENCOUNTER — Ambulatory Visit (INDEPENDENT_AMBULATORY_CARE_PROVIDER_SITE_OTHER): Payer: Self-pay | Admitting: Obstetrics & Gynecology

## 2022-04-02 ENCOUNTER — Encounter (HOSPITAL_BASED_OUTPATIENT_CLINIC_OR_DEPARTMENT_OTHER): Payer: Self-pay | Admitting: Obstetrics & Gynecology

## 2022-04-02 ENCOUNTER — Encounter (HOSPITAL_BASED_OUTPATIENT_CLINIC_OR_DEPARTMENT_OTHER): Payer: Self-pay

## 2022-04-02 DIAGNOSIS — Z5321 Procedure and treatment not carried out due to patient leaving prior to being seen by health care provider: Secondary | ICD-10-CM

## 2022-04-02 NOTE — Progress Notes (Deleted)
66 y.o. [id number] Married White or Caucasian female here for breast and pelvic exam.  I am also following her for ***.  Denies vaginal bleeding.  Patient's last menstrual period was 03/10/2010.          Sexually active: {yes no:314532}   Past Surgical History:  Procedure Laterality Date   TUBAL LIGATION  1984 & 05/2005   reversal in 1995   tubal reversal  1995      stated age Breasts: {Exam; breast:13139::"normal appearance, no masses or tenderness"} Abdomen: soft, non-tender; bowel sounds normal; no masses,  no organomegaly Lymph nodes: Cervical, supraclavicular, and axillary nodes normal.  No abnormal inguinal nodes palpated Neurologic: Grossly normal  Pelvic: External genitalia:  no lesions              Urethra:  normal appearing urethra with no masses, tenderness or lesions              Bartholins and Skenes: normal                 Vagina: normal appearing vagina with atrophic changes ***and no discharge, no lesions              Cervix: {exam; cervix:14595}              Pap taken: {yes no:314532} Bimanual Exam:  Uterus:  {exam; uterus:12215}              Adnexa: {exam; adnexa:12223}               Rectovaginal: Confirms               Anus:  normal sphincter tone, no lesions  Chaperone, ***, CMA, was present for exam.  Assessment/Plan: There are no diagnoses linked to this encounter.

## 2022-04-08 NOTE — Progress Notes (Signed)
Pt left before being seen due to other circumstances.

## 2022-05-09 IMAGING — MG DIGITAL SCREENING BILAT W/ TOMO W/ CAD
8 series · 9 of 24 positions shown · non-contrast
Comparison: Previous exam(s).

CLINICAL DATA: Screening.

EXAM:
DIGITAL SCREENING BILATERAL MAMMOGRAM WITH TOMO AND CAD

[R CC synth-2D]
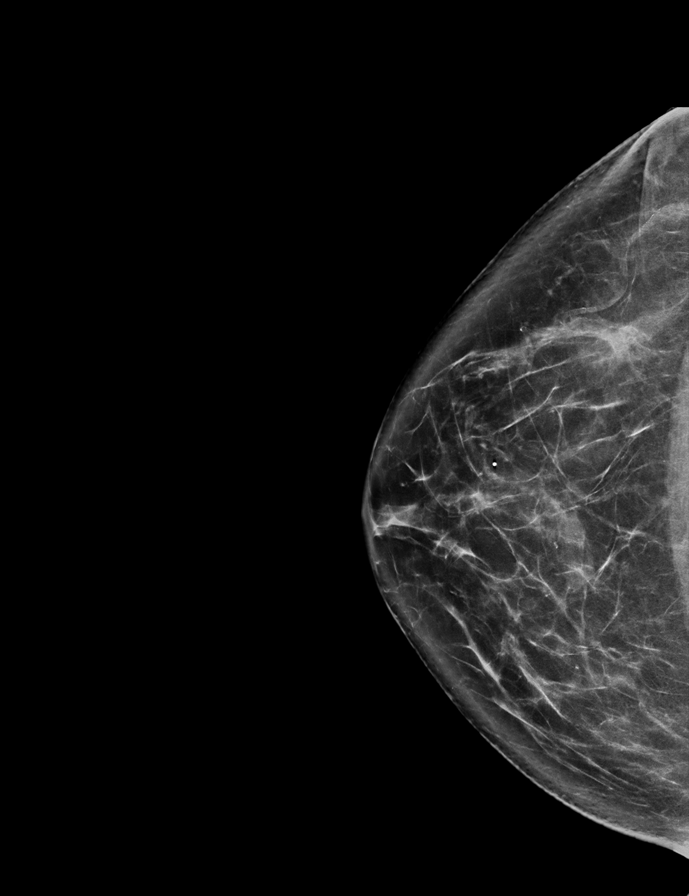

[L CC synth-2D]
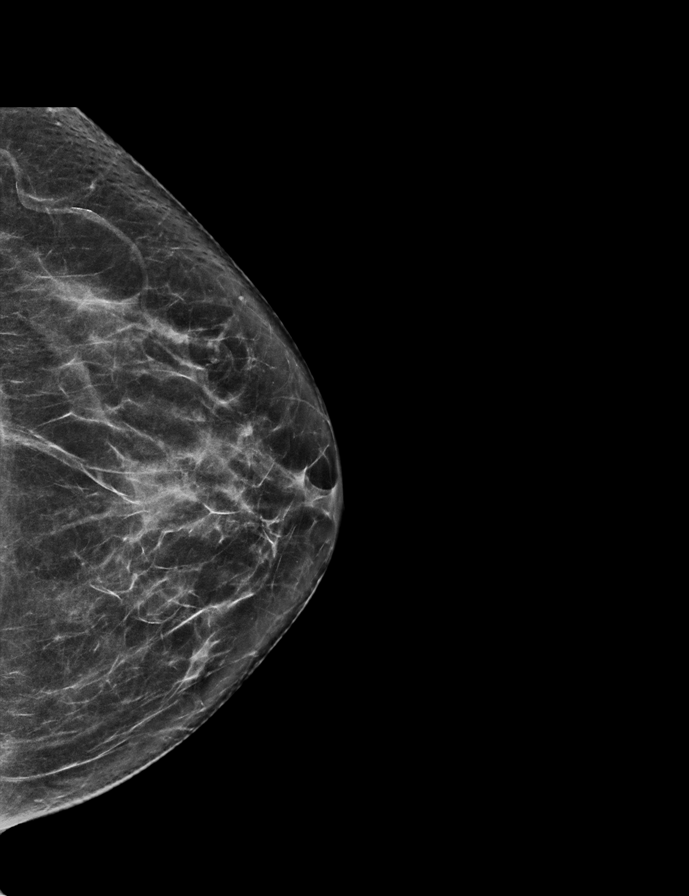

[L MLO synth-2D]
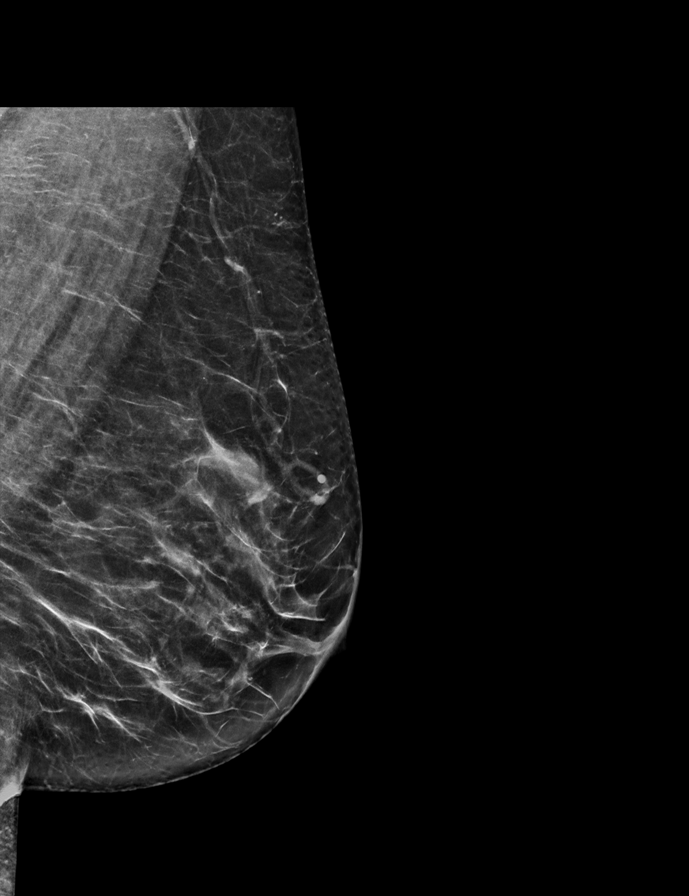

[R MLO synth-2D]
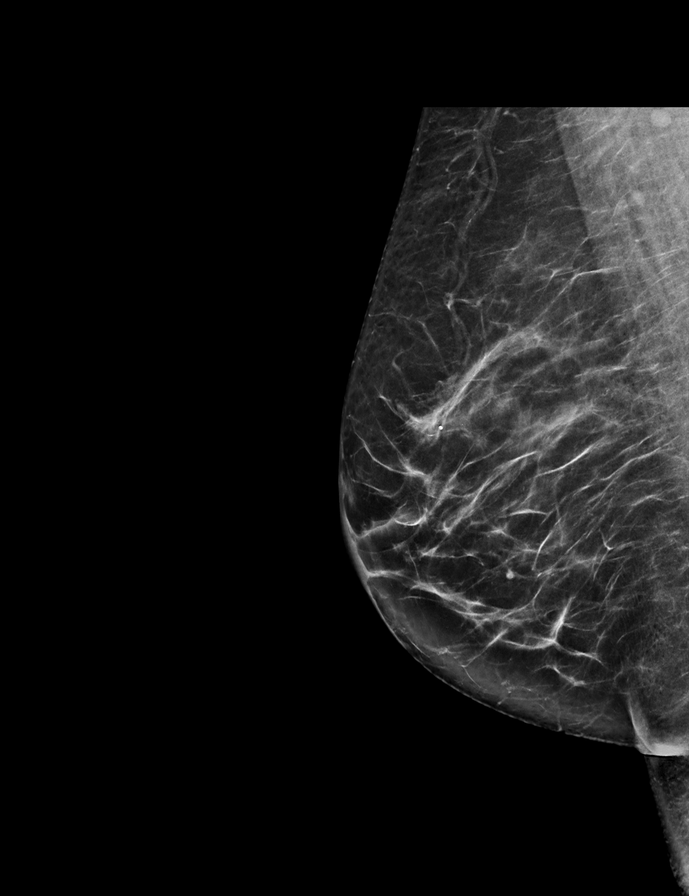

[R CC tomo · 2 of 73 frames shown]
[frame 24/73]
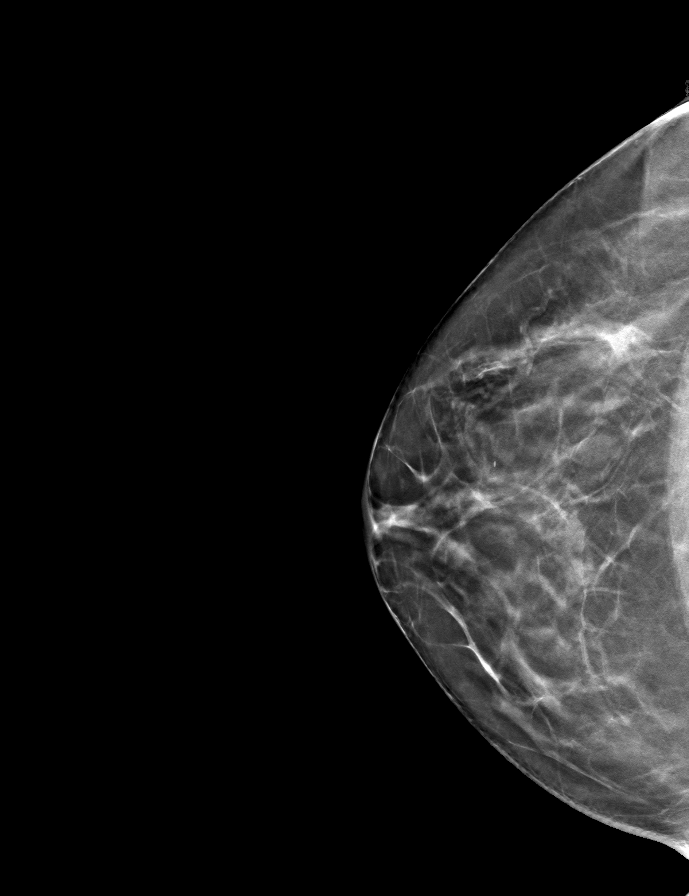
[frame 37/73]
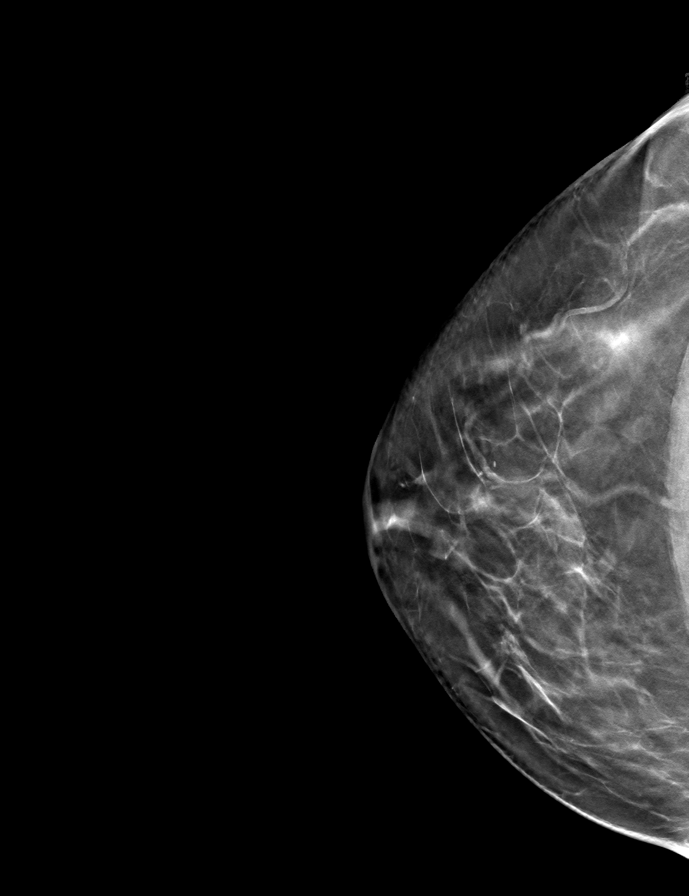

[L MLO tomo · tomo slice 33/64.0]
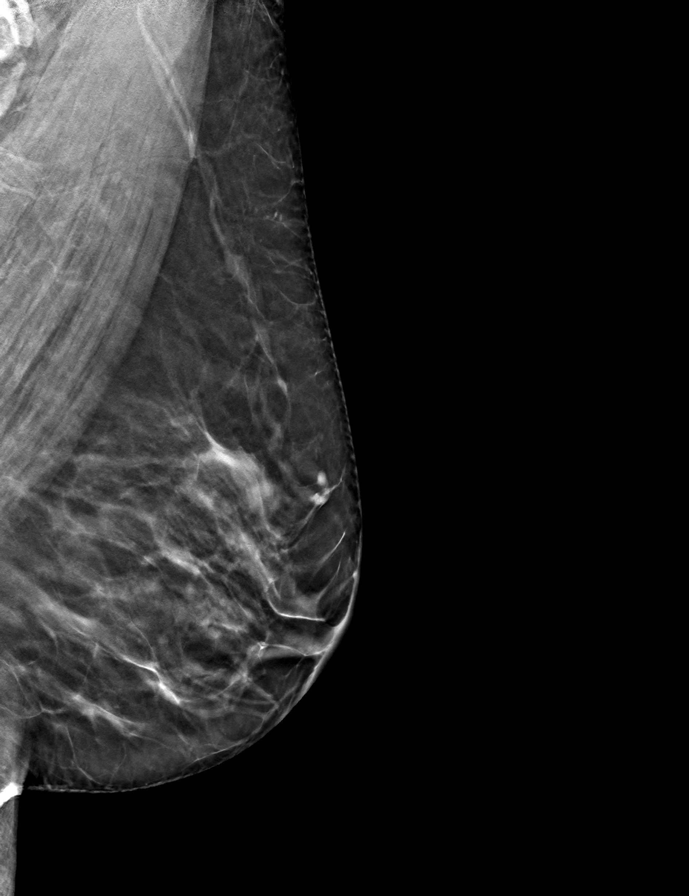

[L CC tomo · tomo slice 31/62.0]
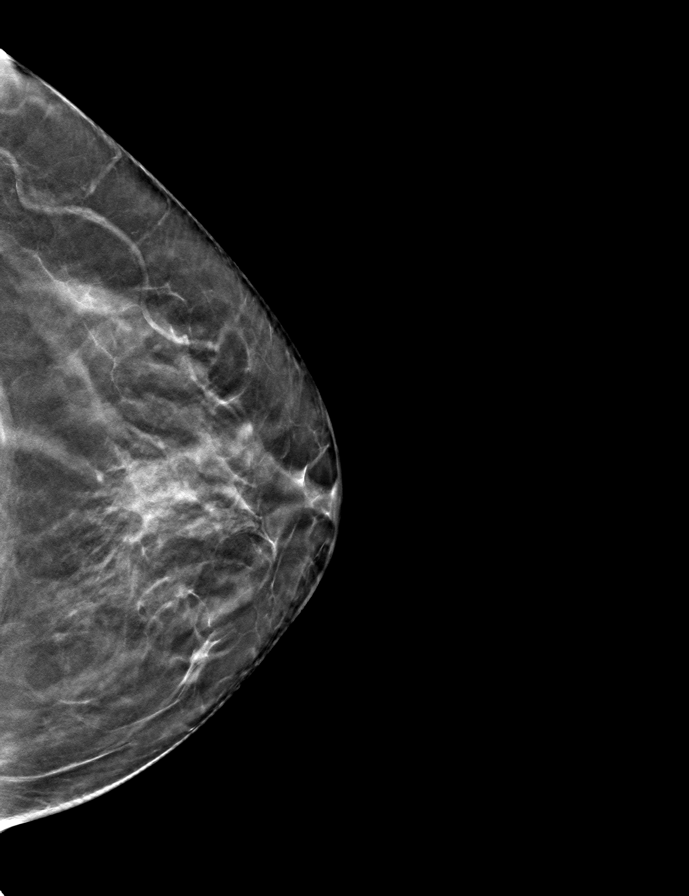

[R MLO tomo · tomo slice 35/69.0]
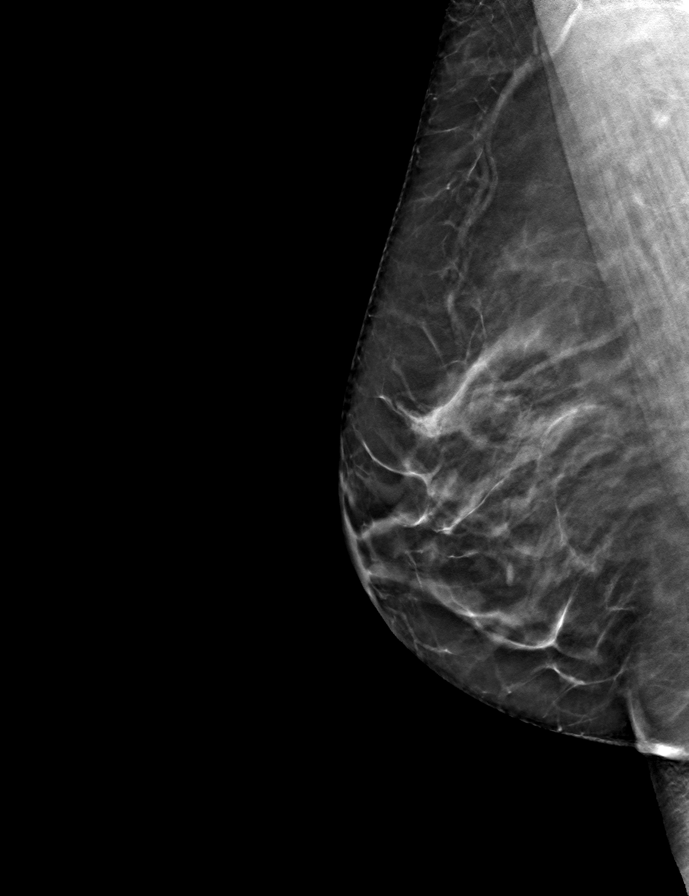

[9 of 24 positions shown; findings below may reference images not displayed]

ACR Breast Density Category b: There are scattered areas of
fibroglandular density.
FINDINGS: There are no findings suspicious for malignancy. Images were
processed with CAD.
IMPRESSION: No mammographic evidence of malignancy. A result letter of this
screening mammogram will be mailed directly to the patient.

RECOMMENDATION:
Screening mammogram in one year. (Code:CN-U-775)

BI-RADS CATEGORY  1: Negative.

## 2022-05-15 ENCOUNTER — Ambulatory Visit: Payer: 59 | Admitting: Family Medicine

## 2022-05-16 ENCOUNTER — Ambulatory Visit: Payer: 59 | Admitting: Family Medicine

## 2022-05-21 ENCOUNTER — Ambulatory Visit: Payer: 59 | Admitting: Family Medicine

## 2022-07-22 ENCOUNTER — Ambulatory Visit: Payer: 59 | Admitting: Podiatry

## 2022-09-13 ENCOUNTER — Encounter: Payer: Self-pay | Admitting: Family Medicine

## 2022-09-13 ENCOUNTER — Ambulatory Visit (INDEPENDENT_AMBULATORY_CARE_PROVIDER_SITE_OTHER): Payer: 59 | Admitting: Family Medicine

## 2022-09-13 VITALS — BP 120/72 | HR 99 | Temp 97.9°F | Resp 17 | Ht 62.0 in | Wt 124.2 lb

## 2022-09-13 DIAGNOSIS — E785 Hyperlipidemia, unspecified: Secondary | ICD-10-CM

## 2022-09-13 DIAGNOSIS — E039 Hypothyroidism, unspecified: Secondary | ICD-10-CM

## 2022-09-13 DIAGNOSIS — I10 Essential (primary) hypertension: Secondary | ICD-10-CM | POA: Diagnosis not present

## 2022-09-13 LAB — BASIC METABOLIC PANEL
BUN: 14 mg/dL (ref 6–23)
CO2: 32 mEq/L (ref 19–32)
Calcium: 9.6 mg/dL (ref 8.4–10.5)
Chloride: 100 mEq/L (ref 96–112)
Creatinine, Ser: 0.67 mg/dL (ref 0.40–1.20)
GFR: 91.25 mL/min (ref >60.00)
Glucose, Bld: 101 mg/dL — ABNORMAL HIGH (ref 70–99)
Potassium: 3.8 mEq/L (ref 3.5–5.1)
Sodium: 138 mEq/L (ref 135–145)

## 2022-09-13 LAB — CBC WITH DIFFERENTIAL/PLATELET
Basophils Absolute: 0 10*3/uL (ref 0.0–0.1)
Basophils Relative: 0.8 % (ref 0.0–3.0)
Eosinophils Absolute: 0.3 10*3/uL (ref 0.0–0.7)
Eosinophils Relative: 4.6 % (ref 0.0–5.0)
HCT: 42.7 % (ref 36.0–46.0)
Hemoglobin: 14.7 g/dL (ref 12.0–15.0)
Lymphocytes Relative: 26.2 % (ref 12.0–46.0)
Lymphs Abs: 1.6 10*3/uL (ref 0.7–4.0)
MCHC: 34.4 g/dL (ref 30.0–36.0)
MCV: 93.7 fl (ref 78.0–100.0)
Monocytes Absolute: 0.5 10*3/uL (ref 0.1–1.0)
Monocytes Relative: 8.9 % (ref 3.0–12.0)
Neutro Abs: 3.6 10*3/uL (ref 1.4–7.7)
Neutrophils Relative %: 59.5 % (ref 43.0–77.0)
Platelets: 231 10*3/uL (ref 150.0–400.0)
RBC: 4.55 Mil/uL (ref 3.87–5.11)
RDW: 12.7 % (ref 11.5–15.5)
WBC: 6 10*3/uL (ref 4.0–10.5)

## 2022-09-13 LAB — HEPATIC FUNCTION PANEL
ALT: 13 U/L (ref 0–35)
AST: 27 U/L (ref 0–37)
Albumin: 4.5 g/dL (ref 3.5–5.2)
Alkaline Phosphatase: 65 U/L (ref 39–117)
Bilirubin, Direct: 0.1 mg/dL (ref 0.0–0.3)
Total Bilirubin: 0.6 mg/dL (ref 0.2–1.2)
Total Protein: 6.7 g/dL (ref 6.0–8.3)

## 2022-09-13 LAB — LIPID PANEL
Cholesterol: 217 mg/dL — ABNORMAL HIGH (ref 0–200)
HDL: 63.4 mg/dL (ref >39.00)
LDL Cholesterol: 131 mg/dL — ABNORMAL HIGH (ref 0–99)
NonHDL: 153.99
Total CHOL/HDL Ratio: 3
Triglycerides: 113 mg/dL (ref 0.0–149.0)
VLDL: 22.6 mg/dL (ref 0.0–40.0)

## 2022-09-13 LAB — TSH: TSH: 3.22 u[IU]/mL (ref 0.35–5.50)

## 2022-09-13 NOTE — Patient Instructions (Signed)
Schedule your complete physical in 6 months We'll notify you of your lab results and make any changes if needed Keep up the good work on healthy diet and regular exercise- you look great! Call with any questions or concerns Stay Safe!  Stay Healthy!! Happy Spring!!! 

## 2022-09-13 NOTE — Progress Notes (Signed)
   Subjective:    Patient ID: Sonia Kim, female    DOB: [birth year], 67 y.o.   MRN: [id number]  HPI HTN- chronic problem, on HCTZ 12.5mg  w/ good control.  No CP, SOB, HA's, visual changes, edema  Hyperlipidemia- chronic problem, on Crestor 5mg  daily.  No abd pain, N/V.  Hypothyroid- chronic problem, on Levothyroxine daily.  Energy level stable.  Some thinning of hair.   Review of Systems For ROS see HPI     Objective:   Physical Exam Vitals reviewed.  Constitutional:      General: She is not in acute distress.    Appearance: Normal appearance. She is well-developed. She is not ill-appearing.  HENT:     Head: Normocephalic and atraumatic.  Eyes:     Conjunctiva/sclera: Conjunctivae normal.     Pupils: Pupils are equal, round, and reactive to light.  Neck:     Thyroid: No thyromegaly.  Cardiovascular:     Rate and Rhythm: Normal rate and regular rhythm.     Pulses: Normal pulses.     Heart sounds: Normal heart sounds. No murmur heard. Pulmonary:     Effort: Pulmonary effort is normal. No respiratory distress.     Breath sounds: Normal breath sounds.  Abdominal:     General: There is no distension.     Palpations: Abdomen is soft.     Tenderness: There is no abdominal tenderness.  Musculoskeletal:     Cervical back: Normal range of motion and neck supple.     Right lower leg: No edema.     Left lower leg: No edema.  Lymphadenopathy:     Cervical: No cervical adenopathy.  Skin:    General: Skin is warm and dry.  Neurological:     General: No focal deficit present.     Mental Status: She is alert and oriented to person, place, and time.  Psychiatric:        Behavior: Behavior normal.           Assessment & Plan:

## 2022-09-13 NOTE — Assessment & Plan Note (Signed)
Chronic problem.  Currently asymptomatic w/ exception of thinning hair on Levothyroxine daily.  Check labs.  Adjust meds prn

## 2022-09-13 NOTE — Assessment & Plan Note (Signed)
Chronic problem, on Crestor 5mg daily w/o difficulty.  Check labs.  Adjust meds prn  

## 2022-09-13 NOTE — Assessment & Plan Note (Signed)
Chronic problem.  Currently well controlled on HCTZ 12.5mg  daily.  Asymptomatic.  Will check labs due to diuretic use.

## 2022-09-15 ENCOUNTER — Encounter: Payer: Self-pay | Admitting: Family Medicine

## 2022-09-15 DIAGNOSIS — E785 Hyperlipidemia, unspecified: Secondary | ICD-10-CM

## 2022-09-16 ENCOUNTER — Telehealth: Payer: Self-pay

## 2022-09-16 NOTE — Telephone Encounter (Signed)
-----   Message from Sheliah Hatch, MD sent at 09/13/2022  4:50 PM EDT ----- Labs look good!  Total cholesterol and LDL (bad cholesterol) have both jumped by 30-40 points.  Thankfully the ratio of good to bad cholesterol remains excellent!  Please make sure you are taking your Crestor (Rosuvastatin) daily and continue to work on healthy diet and regular exercise.  No changes at this time.

## 2022-09-16 NOTE — Telephone Encounter (Signed)
Pt seen results Via my chart  

## 2022-09-17 MED ORDER — ROSUVASTATIN CALCIUM 10 MG PO TABS: 10.0000 mg | ORAL_TABLET | Freq: Every day | ORAL | 3 refills | Status: DC

## 2022-10-14 ENCOUNTER — Telehealth: Payer: Self-pay | Admitting: Family Medicine

## 2022-10-14 NOTE — Telephone Encounter (Signed)
Pt refused apt states she is feeling better

## 2022-10-14 NOTE — Telephone Encounter (Signed)
   Called pt and she states she fine and feels much better. Refused to make an appt.

## 2022-10-15 ENCOUNTER — Ambulatory Visit (INDEPENDENT_AMBULATORY_CARE_PROVIDER_SITE_OTHER): Payer: 59 | Admitting: Family Medicine

## 2022-10-15 ENCOUNTER — Encounter: Payer: Self-pay | Admitting: Family Medicine

## 2022-10-15 VITALS — Temp 98.1°F | Ht 62.0 in | Wt 125.4 lb

## 2022-10-15 DIAGNOSIS — H9191 Unspecified hearing loss, right ear: Secondary | ICD-10-CM

## 2022-10-15 DIAGNOSIS — R42 Dizziness and giddiness: Secondary | ICD-10-CM | POA: Diagnosis not present

## 2022-10-15 DIAGNOSIS — R112 Nausea with vomiting, unspecified: Secondary | ICD-10-CM

## 2022-10-15 MED ORDER — ONDANSETRON HCL 4 MG PO TABS: 4.0000 mg | ORAL_TABLET | Freq: Three times a day (TID) | ORAL | 0 refills | Status: AC | PRN

## 2022-10-15 NOTE — Progress Notes (Signed)
Acute Office Visit   Subjective:  Patient ID: Sonia Kim, female    DOB: [birth year], 67 y.o.   MRN: [id number]  Chief Complaint  Patient presents with   Dizziness    Pt reports dizziness for a couple of weeks. Pt reports vertigo last Thursday. She took meclizine. Pt reports heart beat in right ear. Sx been going on for couple of weeks.   HPI:  Patient is a 67 year old caucasian female that presents with intermittent dizziness for the last two weeks. History of vertigo. She reports her symptoms are "kinda" like the symptoms she has previously had when experiencing vertigo. She reports Friday was the worst day. Associated symptoms of nausea and vomiting. She reports she took Meclizine and it resolved for the day. She reports she still has had some intermittent dizziness. Also, reports she feels a "heart beat" in her right ear. Started a couple of weeks ago, starting off constant, but now it is intermittent.   Denies chest pain, shortness of breath.   Review of Systems  Neurological:  Positive for dizziness.   See HPI above     Objective:  BP:128/70 Temp 98.1 F (36.7 C)   Ht 5\' 2"  (1.575 m)   Wt 125 lb 6 oz (56.9 kg)   LMP 03/10/2010   SpO2 97%   BMI 22.93 kg/m   Physical Exam Vitals reviewed.  Constitutional:      General: She is not in acute distress.    Appearance: Normal appearance. She is not ill-appearing, toxic-appearing or diaphoretic.  HENT:     Head: Normocephalic and atraumatic.     Right Ear: Tympanic membrane, ear canal and external ear normal. There is no impacted cerumen.     Left Ear: Tympanic membrane, ear canal and external ear normal. There is no impacted cerumen.  Eyes:     General:        Right eye: No discharge.        Left eye: No discharge.     Extraocular Movements:     Right eye: No nystagmus.     Left eye: No nystagmus.     Conjunctiva/sclera: Conjunctivae normal.     Pupils: Pupils are equal, round, and reactive to light.      Comments: She reports she got dizzy when assessing for nystagmus.   Cardiovascular:     Rate and Rhythm: Normal rate and regular rhythm.     Heart sounds: Normal heart sounds. No murmur heard.    No friction rub. No gallop.  Pulmonary:     Effort: Pulmonary effort is normal. No respiratory distress.     Breath sounds: Normal breath sounds.  Musculoskeletal:        General: Normal range of motion.  Skin:    General: Skin is warm and dry.  Neurological:     General: No focal deficit present.     Mental Status: She is alert and oriented to person, place, and time. Mental status is at baseline.     Cranial Nerves: Cranial nerves 2-12 are intact.     Motor: No weakness.     Gait: Gait normal.  Psychiatric:        Mood and Affect: Mood normal.        Behavior: Behavior normal.        Thought Content: Thought content normal.        Judgment: Judgment normal.       Assessment & Plan:  Dizziness -  Ambulatory referral to Physical Therapy -     Ambulatory referral to ENT -     Orthostatic vital signs  Change in hearing of right ear -     Ambulatory referral to ENT  Nausea and vomiting, unspecified vomiting type -     Ondansetron HCl; Take 1 tablet (4 mg total) by mouth every 8 (eight) hours as needed for nausea or vomiting.  Dispense: 20 tablet; Refill: 0  -Orthostatics negative. (132/74 HR 72; 140/72 HR 75, 122/84, HR 78) -Placed a referral to ENT for dizziness and change in hearing to right ear. Suspect dizziness is stemming from inner ear.  -Placed a referral to physical therapy to evaluate dizziness.  -Prescribed Zofran 4mg  ODT to take as needed for nausea and vomiting if this occurs again. She declines that she needs additional Meclizine.  -Advised do not drive if she is experiencing dizziness.  -If you develop chest pain or shortness of breath, please go to the emergency department.   No follow-ups on file.  Zandra Abts, NP

## 2022-10-15 NOTE — Patient Instructions (Signed)
-  Placed a referral to ENT for dizziness and change in hearing to right ear. If you do not hear about an appointment in 2 weeks, please call the office. -Placed a referral to physical therapy to evaluate dizziness. If you do not hear about an appointment in 2 weeks, please call the office.  -Prescribed Zofran 4mg  ODT to take as needed for nausea and vomiting. -Do not drive if you are experiencing dizziness.  -If you develop chest pain or shortness of breath, please go to the emergency department.

## 2022-10-28 ENCOUNTER — Encounter: Payer: Self-pay | Admitting: Family Medicine

## 2022-10-28 ENCOUNTER — Ambulatory Visit (INDEPENDENT_AMBULATORY_CARE_PROVIDER_SITE_OTHER): Payer: 59 | Admitting: Family Medicine

## 2022-10-28 VITALS — BP 108/72 | HR 67 | Temp 98.2°F | Resp 17 | Ht 62.0 in | Wt 124.2 lb

## 2022-10-28 DIAGNOSIS — R42 Dizziness and giddiness: Secondary | ICD-10-CM | POA: Diagnosis not present

## 2022-10-28 DIAGNOSIS — H66001 Acute suppurative otitis media without spontaneous rupture of ear drum, right ear: Secondary | ICD-10-CM

## 2022-10-28 MED ORDER — AMOXICILLIN 875 MG PO TABS: 875.0000 mg | ORAL_TABLET | Freq: Two times a day (BID) | ORAL | 0 refills | Status: AC

## 2022-10-28 NOTE — Patient Instructions (Addendum)
Follow up as needed or as scheduled START the Amoxicillin twice daily- take w/ food HOLD the HCTZ INCREASE your water intake!! Use a heating pad for your neck Do some gentle stretching for your neck Please let me know if things aren't improving Hang in there!!!

## 2022-10-28 NOTE — Progress Notes (Unsigned)
   Subjective:    Patient ID: Sonia Kim, female    DOB: August 15, 1955, 67 y.o.   MRN: [id number]  HPI Lightheadedness- pt reports she can be at rest when she gets a wave of lightheadedness.  When it occurs, her heart will race and she feels weak.  Pt reports she had an episode of severe vertigo 3 weeks ago and since then has felt that things 'are not right'.  States she has white noise in her ears, neck muscles are 'so tight I can barely turn my head'.  Pt admits to being a poor water drinker.     Review of Systems For ROS see HPI     Objective:   Physical Exam Vitals reviewed.  Constitutional:      General: She is not in acute distress.    Appearance: Normal appearance. She is not ill-appearing.  HENT:     Head: Normocephalic and atraumatic.     Right Ear: A middle ear effusion is present. Tympanic membrane is erythematous and bulging.     Left Ear: Tympanic membrane and ear canal normal.  Neck:     Comments: + trap spasm bilaterally Cardiovascular:     Rate and Rhythm: Normal rate and regular rhythm.     Heart sounds: Normal heart sounds.  Pulmonary:     Effort: Pulmonary effort is normal. No respiratory distress.     Breath sounds: Normal breath sounds. No wheezing.  Skin:    General: Skin is warm and dry.  Neurological:     General: No focal deficit present.     Mental Status: She is alert and oriented to person, place, and time.  Psychiatric:        Mood and Affect: Mood normal.        Behavior: Behavior normal.        Thought Content: Thought content normal.           Assessment & Plan:  R OM- new.  Pt w/ R ear infection which is likely contributing to her lightheadedness.  Will start tx w/ Amoxicillin.  Pt expressed understanding and is in agreement w/ plan.   Lightheadedness- new.  Pt reports severe episode of vertigo weeks ago and has been feeling lightheaded since.  She has a current ear infection which is likely contributing, but may also have  labyrinthitis.  Encouraged prednisone- which would also help her trap spasm- but pt prefers to hold off at this time and see if the Amoxicillin alone improves the issue.  Encouraged increased water intake and changing positions slowly.  Will follow.

## 2022-10-31 ENCOUNTER — Encounter: Payer: Self-pay | Admitting: Family Medicine

## 2022-10-31 MED ORDER — PREDNISONE 10 MG PO TABS: ORAL_TABLET | ORAL | 0 refills | Status: DC

## 2022-11-06 ENCOUNTER — Encounter: Payer: Self-pay | Admitting: Family Medicine

## 2022-11-15 ENCOUNTER — Other Ambulatory Visit: Payer: 59

## 2022-11-25 ENCOUNTER — Encounter: Payer: Self-pay | Admitting: Family Medicine

## 2022-11-25 NOTE — Telephone Encounter (Signed)
Patient reports some aches and pains after starting Statin, please advise how you feel best to continue

## 2022-12-04 ENCOUNTER — Other Ambulatory Visit (INDEPENDENT_AMBULATORY_CARE_PROVIDER_SITE_OTHER): Payer: 59

## 2022-12-04 DIAGNOSIS — E785 Hyperlipidemia, unspecified: Secondary | ICD-10-CM

## 2022-12-04 LAB — LIPID PANEL
Cholesterol: 160 mg/dL (ref 0–200)
HDL: 55.4 mg/dL (ref >39.00)
LDL Cholesterol: 88 mg/dL (ref 0–99)
NonHDL: 104.83
Total CHOL/HDL Ratio: 3
Triglycerides: 84 mg/dL (ref 0.0–149.0)
VLDL: 16.8 mg/dL (ref 0.0–40.0)

## 2022-12-04 LAB — HEPATIC FUNCTION PANEL
ALT: 14 U/L (ref 0–35)
AST: 26 U/L (ref 0–37)
Albumin: 4.3 g/dL (ref 3.5–5.2)
Alkaline Phosphatase: 57 U/L (ref 39–117)
Bilirubin, Direct: 0.1 mg/dL (ref 0.0–0.3)
Total Bilirubin: 0.7 mg/dL (ref 0.2–1.2)
Total Protein: 6.5 g/dL (ref 6.0–8.3)

## 2022-12-05 ENCOUNTER — Telehealth: Payer: Self-pay

## 2022-12-05 NOTE — Telephone Encounter (Signed)
-----   Message from Katherine E Tabori, MD sent at 12/05/2022  7:36 AM EDT ----- Labs look great!  No changes at this time 

## 2022-12-05 NOTE — Telephone Encounter (Signed)
Pt aware of lab results 

## 2022-12-06 DIAGNOSIS — M79641 Pain in right hand: Secondary | ICD-10-CM | POA: Insufficient documentation

## 2022-12-17 ENCOUNTER — Ambulatory Visit (INDEPENDENT_AMBULATORY_CARE_PROVIDER_SITE_OTHER): Payer: 59 | Admitting: Family Medicine

## 2022-12-17 VITALS — BP 128/68 | HR 74 | Temp 97.8°F | Ht 62.0 in | Wt 124.5 lb

## 2022-12-17 DIAGNOSIS — M2550 Pain in unspecified joint: Secondary | ICD-10-CM

## 2022-12-17 DIAGNOSIS — R768 Other specified abnormal immunological findings in serum: Secondary | ICD-10-CM | POA: Diagnosis not present

## 2022-12-17 DIAGNOSIS — R7689 Other specified abnormal immunological findings in serum: Secondary | ICD-10-CM

## 2022-12-17 DIAGNOSIS — R252 Cramp and spasm: Secondary | ICD-10-CM

## 2022-12-17 MED ORDER — MELOXICAM 15 MG PO TABS
15.0000 mg | ORAL_TABLET | Freq: Every day | ORAL | 0 refills | Status: DC
Start: 2022-12-17 — End: 2024-02-24

## 2022-12-17 NOTE — Patient Instructions (Addendum)
-  START Meloxicam 15 mg tablet, 1 tablet daily. Recommend to not take additional NSAIDS, such as Advil, Alevel, Ibuprofen, or Naproxen while taking Meloxicam. Recommend to eat something when taking medication. -Discontinue Celebrex since it is causing nausea -Ordered a RA and ANA profile. Office will call with results and you may see results on MyChart.  -Follow up with [facility] as already scheduled. -Recommend to stay off Rosuvastatin 1-2 weeks to see if pain changes, if it does not make a difference, restart taking medication. If you feel the medication has caused an increase in pain, send Dr. Beverely Low a message, she may want you to only take 5mg  instead to provide some lipid protection.

## 2022-12-17 NOTE — Progress Notes (Signed)
Acute Office Visit   Subjective:  Patient ID: Staysha Truby, female    DOB: [birth year], 67 y.o.   MRN: [id number]  Chief Complaint  Patient presents with   Temporomandibular Joint Pain    Pt is here today for C/O of joint pain for 2 months Pt was seen at [facility] And was given rx Celebrex but was able to take this medication Pt reports she was tested for RA and this was negative 1 yr ago. Pt reports she stopped taking Crestor to see if this changes the way she feel. She day 3 day.    HPI Patient is a 67 year old female that presents with joint and muscle pain for 2 months. She reports the pain has become worse in the last 2 months. Pain described throbbing and burning. Constant pain. She reports the pain improves some with moving around. She reports her Rosuvastatin was increased to 10mg  from 5mg  about a month ago or little over a month ago. She reports she has not had medication in 3 days to see if this was a reason for her pain increasing. She reports she has had constant numbness and tingling in feet.   She reports she was at [facility] about 2 weeks ago for hand pain. She reports she had x-rays completed on both hands and reported osteoarthritis. She was prescribed Celebrex, but stopped taking medication after 3 days because of nausea. Currently, taking Ibuprofen 400 mg BID with some relief. She reports her next appointment is at the end of the month. She denies informing ortho about discontinuing medication.   She reports she has previously took Meloxicam years ago (2017-2021). She reports she just stopped taking medication since her pain was not that bad then.   She reports she seen rheumatology about 5-6 years ago once for hand pain. Denies ever having a positive RA.   ROS See HPI above      Objective:   BP 128/68   Pulse 74   Temp 97.8 F (36.6 C)   Ht 5\' 2"  (1.575 m)   Wt 124 lb 8 oz (56.5 kg)   LMP 03/10/2010   SpO2 98%   BMI 22.77 kg/m    Physical  Exam Constitutional:      General: She is not in acute distress.    Appearance: Normal appearance. She is not ill-appearing.  HENT:     Head: Normocephalic.  Cardiovascular:     Rate and Rhythm: Normal rate and regular rhythm.     Heart sounds: Normal heart sounds. No murmur heard.    No friction rub. No gallop.  Pulmonary:     Effort: Pulmonary effort is normal. No respiratory distress.     Breath sounds: Normal breath sounds.  Musculoskeletal:        General: Swelling (PIP joints in both hands) present. Normal range of motion.     Comments: Decreased ROM in hands with bending  Skin:    General: Skin is warm and dry.  Neurological:     General: No focal deficit present.     Mental Status: She is alert and oriented to person, place, and time.  Psychiatric:        Mood and Affect: Mood normal.        Behavior: Behavior normal.        Thought Content: Thought content normal.        Judgment: Judgment normal.       Assessment & Plan:  Polyarthralgia -  Meloxicam; Take 1 tablet (15 mg total) by mouth daily.  Dispense: 30 tablet; Refill: 0 -     Rheumatoid factor -     ANA  Muscle cramps -     Meloxicam; Take 1 tablet (15 mg total) by mouth daily.  Dispense: 30 tablet; Refill: 0  -START Meloxicam 15 mg tablet, 1 tablet daily. Recommend to not take additional NSAIDS, such as Advil, Alevel, Ibuprofen, or Naproxen while taking Meloxicam. Recommend to eat something when taking medication. -Discontinue Celebrex since it is causing nausea -Ordered a RA and ANA profile. Office will call with results and you may see results on MyChart.  -Follow up with [facility] as already scheduled. -Recommend to stay off Rosuvastatin 1-2 weeks to see if pain changes, if it does not make a difference, restart taking medication. If you feel the medication has caused an increase in pain, send Dr. Beverely Low a message, she may want you to only take 5mg  instead to provide some lipid protection.   Zandra Abts, NP

## 2022-12-19 ENCOUNTER — Other Ambulatory Visit: Payer: Self-pay | Admitting: Family Medicine

## 2022-12-19 LAB — ANTI-NUCLEAR AB-TITER (ANA TITER): ANA Titer 1: 1:40 {titer} — ABNORMAL HIGH

## 2022-12-19 LAB — ANA: Anti Nuclear Antibody (ANA): POSITIVE — AB

## 2022-12-19 LAB — RHEUMATOID FACTOR: Rheumatoid fact SerPl-aCnc: <10 IU/mL (ref <14)

## 2022-12-20 NOTE — Addendum Note (Signed)
Addended by: Eldred Manges on: 12/20/2022 08:53 AM   Modules accepted: Orders

## 2022-12-23 ENCOUNTER — Ambulatory Visit: Payer: 59 | Admitting: Podiatry

## 2022-12-26 ENCOUNTER — Telehealth: Payer: Self-pay | Admitting: Family Medicine

## 2022-12-26 ENCOUNTER — Other Ambulatory Visit: Payer: Self-pay

## 2022-12-26 DIAGNOSIS — R768 Other specified abnormal immunological findings in serum: Secondary | ICD-10-CM

## 2022-12-26 DIAGNOSIS — R7689 Other specified abnormal immunological findings in serum: Secondary | ICD-10-CM

## 2022-12-26 NOTE — Telephone Encounter (Signed)
Are you ok with referring her to a new Rheumatologist ?

## 2022-12-26 NOTE — Telephone Encounter (Signed)
I have sent the referral in to Rheumatologist . Pt is aware

## 2022-12-26 NOTE — Telephone Encounter (Signed)
Ok to refer to new requested provider

## 2022-12-26 NOTE — Telephone Encounter (Signed)
Patient would like a referral to a different Rheumatology office. The one she went to prior she has been calling for her results and noone has gotten back to her. So she would like to change to Dr. Casimer Lanius , please fax to [phone]

## 2022-12-31 ENCOUNTER — Telehealth (HOSPITAL_BASED_OUTPATIENT_CLINIC_OR_DEPARTMENT_OTHER): Payer: Self-pay | Admitting: Obstetrics & Gynecology

## 2022-12-31 NOTE — Telephone Encounter (Signed)
Patient called and would like for the nurse to please call her she thinks she has a UTI.

## 2023-01-01 ENCOUNTER — Other Ambulatory Visit (HOSPITAL_COMMUNITY)
Admission: RE | Admit: 2023-01-01 | Discharge: 2023-01-01 | Disposition: A | Discharge status: Home / Self Care | Payer: 59 | Source: Ambulatory Visit | Attending: Obstetrics & Gynecology | Admitting: Obstetrics & Gynecology

## 2023-01-01 ENCOUNTER — Encounter (HOSPITAL_BASED_OUTPATIENT_CLINIC_OR_DEPARTMENT_OTHER): Payer: Self-pay

## 2023-01-01 ENCOUNTER — Ambulatory Visit (INDEPENDENT_AMBULATORY_CARE_PROVIDER_SITE_OTHER): Payer: 59

## 2023-01-01 VITALS — BP 152/70 | HR 63 | Ht 62.0 in | Wt 124.0 lb

## 2023-01-01 DIAGNOSIS — N898 Other specified noninflammatory disorders of vagina: Secondary | ICD-10-CM

## 2023-01-01 NOTE — Telephone Encounter (Signed)
Returned pts call. She reports some vaginal discharge and irritation/soreness after being on a course of antibiotics. Pt provided with appt for self-swab.

## 2023-01-01 NOTE — Progress Notes (Signed)
Patient came in today with complaints of vaginal discharge and itching. Patient gave an  aptima self swab to be sent off for evaluation. tbw

## 2023-01-02 LAB — CERVICOVAGINAL ANCILLARY ONLY
Bacterial Vaginitis (gardnerella): NEGATIVE
Candida Vaginitis: NEGATIVE
Comment: NEGATIVE
Comment: NEGATIVE

## 2023-01-03 ENCOUNTER — Encounter: Payer: Self-pay | Admitting: Family Medicine

## 2023-01-25 ENCOUNTER — Other Ambulatory Visit: Payer: Self-pay | Admitting: Family Medicine

## 2023-02-07 ENCOUNTER — Other Ambulatory Visit: Payer: Self-pay | Admitting: Family Medicine

## 2023-02-13 ENCOUNTER — Encounter: Payer: Self-pay | Admitting: Family Medicine

## 2023-02-18 ENCOUNTER — Encounter: Payer: Self-pay | Admitting: Family Medicine

## 2023-02-18 ENCOUNTER — Ambulatory Visit (INDEPENDENT_AMBULATORY_CARE_PROVIDER_SITE_OTHER): Payer: 59 | Admitting: Family Medicine

## 2023-02-18 VITALS — BP 122/80 | HR 66 | Temp 98.1°F | Resp 16 | Ht 62.0 in | Wt 124.5 lb

## 2023-02-18 DIAGNOSIS — R5381 Other malaise: Secondary | ICD-10-CM | POA: Diagnosis not present

## 2023-02-18 DIAGNOSIS — F419 Anxiety disorder, unspecified: Secondary | ICD-10-CM

## 2023-02-18 LAB — CBC WITH DIFFERENTIAL/PLATELET
Basophils Absolute: 0.1 10*3/uL (ref 0.0–0.1)
Basophils Relative: 0.9 % (ref 0.0–3.0)
Eosinophils Absolute: 0.3 10*3/uL (ref 0.0–0.7)
Eosinophils Relative: 4 % (ref 0.0–5.0)
HCT: 42.8 % (ref 36.0–46.0)
Hemoglobin: 14.1 g/dL (ref 12.0–15.0)
Lymphocytes Relative: 25.5 % (ref 12.0–46.0)
Lymphs Abs: 1.8 10*3/uL (ref 0.7–4.0)
MCHC: 33 g/dL (ref 30.0–36.0)
MCV: 94 fl (ref 78.0–100.0)
Monocytes Absolute: 0.5 10*3/uL (ref 0.1–1.0)
Monocytes Relative: 7.3 % (ref 3.0–12.0)
Neutro Abs: 4.4 10*3/uL (ref 1.4–7.7)
Neutrophils Relative %: 62.3 % (ref 43.0–77.0)
Platelets: 220 10*3/uL (ref 150.0–400.0)
RBC: 4.55 Mil/uL (ref 3.87–5.11)
RDW: 12.7 % (ref 11.5–15.5)
WBC: 7.1 10*3/uL (ref 4.0–10.5)

## 2023-02-18 LAB — HEPATIC FUNCTION PANEL
ALT: 10 U/L (ref 0–35)
AST: 20 U/L (ref 0–37)
Albumin: 4.4 g/dL (ref 3.5–5.2)
Alkaline Phosphatase: 76 U/L (ref 39–117)
Bilirubin, Direct: 0.1 mg/dL (ref 0.0–0.3)
Total Bilirubin: 0.6 mg/dL (ref 0.2–1.2)
Total Protein: 6.5 g/dL (ref 6.0–8.3)

## 2023-02-18 LAB — BASIC METABOLIC PANEL
BUN: 15 mg/dL (ref 6–23)
CO2: 29 meq/L (ref 19–32)
Calcium: 9.8 mg/dL (ref 8.4–10.5)
Chloride: 104 meq/L (ref 96–112)
Creatinine, Ser: 0.65 mg/dL (ref 0.40–1.20)
GFR: 91.64 mL/min (ref >60.00)
Glucose, Bld: 78 mg/dL (ref 70–99)
Potassium: 4 meq/L (ref 3.5–5.1)
Sodium: 143 meq/L (ref 135–145)

## 2023-02-18 LAB — CK: Total CK: 52 U/L (ref 7–177)

## 2023-02-18 LAB — TSH: TSH: 3 u[IU]/mL (ref 0.35–5.50)

## 2023-02-18 NOTE — Patient Instructions (Addendum)
Follow up in 6 weeks to recheck mood We'll notify you of your lab results and make any changes if needed INCREASE the Sertraline to 50mg  daily (2 tabs) We'll call you to schedule your counseling appt Call with any questions or concerns Stay Safe!  Stay Healthy! Hang in there!!

## 2023-02-18 NOTE — Assessment & Plan Note (Signed)
Deteriorated.  Pt has had a hard time with her daughter and her ongoing mental health struggles.  She is currently homeless in [place] and bouncing around from facility to facility.  Pt is going up at the end of the month to bring her home to live w/ her.  This is understandably very stressful for her.  Will increase Sertraline to 50mg  daily.  Pt reports having lots of medication remaining so will not send new prescription.  Referral made for counseling.  Discussed how stress can impact memory, brain fog, fatigue, and even cause pain.  Will attempt to better control anxiety and see if physical sxs improve.  Pt expressed understanding and is in agreement w/ plan.

## 2023-02-18 NOTE — Progress Notes (Signed)
   Subjective:    Patient ID: Sonia Kim, female    DOB: 05-19-1956, 67 y.o.   MRN: [id number]  HPI Sonia Kim- pt states she hasn't felt well 'for months'.  She reports joint issues have 'suddenly' become 'progressively worse'.  'every joint in my body hurts, but my hands are the worst'.  Saw Rheumatology who did not feel this was rheumatologic.  Pt is wondering about possible Lyme disease.  No relief w/ Meloxicam.  Voltaren caused upset stomach.  Currently taking Tylenol Arthritis and Motrin.  Having brain fog, word finding issues.  Pt denies every having COVID.    Stress- pt reports high levels of stress.  Daughter is currently homeless in [place].  Will likely be returning home at the end of the month.  Currently on Sertraline 25mg  daily.   Review of Systems For ROS see HPI     Objective:   Physical Exam Vitals reviewed.  Constitutional:      General: She is not in acute distress.    Appearance: Normal appearance. She is well-developed. She is not ill-appearing.  HENT:     Head: Normocephalic and atraumatic.  Eyes:     Conjunctiva/sclera: Conjunctivae normal.     Pupils: Pupils are equal, round, and reactive to light.  Neck:     Thyroid: No thyromegaly.  Cardiovascular:     Rate and Rhythm: Normal rate and regular rhythm.     Pulses: Normal pulses.     Heart sounds: Normal heart sounds. No murmur heard. Pulmonary:     Effort: Pulmonary effort is normal. No respiratory distress.     Breath sounds: Normal breath sounds.  Abdominal:     General: There is no distension.     Palpations: Abdomen is soft.     Tenderness: There is no abdominal tenderness.  Musculoskeletal:     Cervical back: Normal range of motion and neck supple.     Right lower leg: No edema.     Left lower leg: No edema.  Lymphadenopathy:     Cervical: No cervical adenopathy.  Skin:    General: Skin is warm and dry.  Neurological:     General: No focal deficit present.     Mental Status: She is alert  and oriented to person, place, and time.  Psychiatric:        Mood and Affect: Mood normal.        Behavior: Behavior normal.        Thought Content: Thought content normal.           Assessment & Plan:   Malaise- ongoing issue for pt.  Saw rheumatology and they did not feel this was rheumatologic or autoimmune in nature.  Pt wonders if she could possible have lyme disease or if her thyroid is abnormal.  Will check labs.  Had long discussion about the impact of stress on the body and the physical manifestations.  Will treat anxiety and see if any sxs improve or whether we need to keep investigating.  Pt expressed understanding and is in agreement w/ plan.

## 2023-02-19 ENCOUNTER — Telehealth: Payer: Self-pay

## 2023-02-19 LAB — LYME DISEASE SEROLOGY W/REFLEX: Lyme Total Antibody EIA: NEGATIVE

## 2023-02-19 NOTE — Telephone Encounter (Signed)
-----   Message from Neena Rhymes sent at 02/19/2023 12:25 PM EDT ----- Labs look great!  Waiting on Lyme Disease results

## 2023-02-20 ENCOUNTER — Telehealth: Payer: Self-pay

## 2023-02-20 NOTE — Telephone Encounter (Signed)
Pt is aware of the lab results  °

## 2023-02-20 NOTE — Telephone Encounter (Signed)
-----   Message from Neena Rhymes sent at 02/20/2023  7:12 AM EDT ----- No evidence of Lyme disease- great news!

## 2023-02-26 ENCOUNTER — Telehealth: Payer: Self-pay

## 2023-02-26 NOTE — Telephone Encounter (Signed)
Marva from [facility] called and asked for verbal orders for manufacturer change for patient's Levothyroxine. Was transferred to pharmacist Marylu Lund asked if it was okay for patient medication to be changed to the [employer] brand. Gave verbal order per Dr. Beverely Low that it's okay to change. Marylu Lund thanked me for that verbal order change and stated she will call patient to make aware.

## 2023-03-01 ENCOUNTER — Encounter: Payer: Self-pay | Admitting: Family Medicine

## 2023-03-20 ENCOUNTER — Encounter: Payer: Self-pay | Admitting: Family Medicine

## 2023-03-20 ENCOUNTER — Ambulatory Visit (INDEPENDENT_AMBULATORY_CARE_PROVIDER_SITE_OTHER): Payer: 59 | Admitting: Family Medicine

## 2023-03-20 VITALS — BP 152/72 | HR 73 | Temp 98.0°F | Ht 61.5 in | Wt 122.8 lb

## 2023-03-20 DIAGNOSIS — E559 Vitamin D deficiency, unspecified: Secondary | ICD-10-CM | POA: Diagnosis not present

## 2023-03-20 DIAGNOSIS — Z23 Encounter for immunization: Secondary | ICD-10-CM

## 2023-03-20 DIAGNOSIS — I10 Essential (primary) hypertension: Secondary | ICD-10-CM

## 2023-03-20 DIAGNOSIS — Z0000 Encounter for general adult medical examination without abnormal findings: Secondary | ICD-10-CM

## 2023-03-20 LAB — BASIC METABOLIC PANEL
BUN: 12 mg/dL (ref 6–23)
CO2: 31 meq/L (ref 19–32)
Calcium: 9.8 mg/dL (ref 8.4–10.5)
Chloride: 104 meq/L (ref 96–112)
Creatinine, Ser: 0.64 mg/dL (ref 0.40–1.20)
GFR: 91.93 mL/min (ref >60.00)
Glucose, Bld: 103 mg/dL — ABNORMAL HIGH (ref 70–99)
Potassium: 4 meq/L (ref 3.5–5.1)
Sodium: 142 meq/L (ref 135–145)

## 2023-03-20 LAB — HEPATIC FUNCTION PANEL
ALT: 8 U/L (ref 0–35)
AST: 18 U/L (ref 0–37)
Albumin: 4.5 g/dL (ref 3.5–5.2)
Alkaline Phosphatase: 77 U/L (ref 39–117)
Bilirubin, Direct: 0.1 mg/dL (ref 0.0–0.3)
Total Bilirubin: 0.6 mg/dL (ref 0.2–1.2)
Total Protein: 6 g/dL (ref 6.0–8.3)

## 2023-03-20 LAB — CBC WITH DIFFERENTIAL/PLATELET
Basophils Absolute: 0.1 10*3/uL (ref 0.0–0.1)
Basophils Relative: 1 % (ref 0.0–3.0)
Eosinophils Absolute: 0.4 10*3/uL (ref 0.0–0.7)
Eosinophils Relative: 6.2 % — ABNORMAL HIGH (ref 0.0–5.0)
HCT: 43.7 % (ref 36.0–46.0)
Hemoglobin: 14.5 g/dL (ref 12.0–15.0)
Lymphocytes Relative: 24.4 % (ref 12.0–46.0)
Lymphs Abs: 1.5 10*3/uL (ref 0.7–4.0)
MCHC: 33.1 g/dL (ref 30.0–36.0)
MCV: 94.3 fL (ref 78.0–100.0)
Monocytes Absolute: 0.5 10*3/uL (ref 0.1–1.0)
Monocytes Relative: 7.5 % (ref 3.0–12.0)
Neutro Abs: 3.7 10*3/uL (ref 1.4–7.7)
Neutrophils Relative %: 60.9 % (ref 43.0–77.0)
Platelets: 213 10*3/uL (ref 150.0–400.0)
RBC: 4.63 Mil/uL (ref 3.87–5.11)
RDW: 12.6 % (ref 11.5–15.5)
WBC: 6.1 10*3/uL (ref 4.0–10.5)

## 2023-03-20 LAB — LIPID PANEL
Cholesterol: 192 mg/dL (ref 0–200)
HDL: 57.6 mg/dL (ref >39.00)
LDL Cholesterol: 109 mg/dL — ABNORMAL HIGH (ref 0–99)
NonHDL: 134.3
Total CHOL/HDL Ratio: 3
Triglycerides: 125 mg/dL (ref 0.0–149.0)
VLDL: 25 mg/dL (ref 0.0–40.0)

## 2023-03-20 LAB — VITAMIN D 25 HYDROXY (VIT D DEFICIENCY, FRACTURES): VITD: 55.89 ng/mL (ref 30.00–100.00)

## 2023-03-20 LAB — TSH: TSH: 3.44 u[IU]/mL (ref 0.35–5.50)

## 2023-03-20 MED ORDER — AMLODIPINE BESYLATE 5 MG PO TABS: 5.0000 mg | ORAL_TABLET | Freq: Every day | ORAL | 3 refills | Status: DC

## 2023-03-20 NOTE — Patient Instructions (Addendum)
Follow up in 4-5 weeks to recheck blood pressure We'll notify you of your lab results and make any changes if needed Keep up the good work on healthy diet and regular exercise- you look great!! START the Amlodipine once daily SCHEDULE your mammogram!! Call with any questions or concerns Stay Safe!  Stay Healthy! Happy Fall!!!

## 2023-03-20 NOTE — Assessment & Plan Note (Signed)
Pt's PE WNL.  UTD on colonoscopy, Tdap.  Pt to schedule mammogram.  Flu shot given.  Declines PNA.  Check labs.  Anticipatory guidance provided.

## 2023-03-20 NOTE — Progress Notes (Signed)
   Subjective:    Patient ID: Sonia Kim, female    DOB: [birth year], 67 y.o.   MRN: [id number]  HPI CPE- UTD on colonoscopy, Tdap.  Due for flu, PNA (declines), and mammo  Patient Care Team    Relationship Specialty Notifications Start End  Sheliah Hatch, MD PCP - General Family Medicine  08/14/16   Judi Saa, DO Consulting Physician Sports Medicine  06/19/16   Quintella Reichert, MD Consulting Physician Cardiology  06/19/16   Charna Elizabeth, MD Consulting Physician Gastroenterology  08/14/16   Ria Comment, FNP Nurse Practitioner Family Medicine  08/14/16     Health Maintenance  Topic Date Due   Pneumonia Vaccine 40+ Years old (1 of 1 - PCV) Never done   INFLUENZA VACCINE  01/09/2023   MAMMOGRAM  03/16/2023   DTaP/Tdap/Td (2 - Td or Tdap) 09/12/2023   Colonoscopy  02/16/2026   DEXA SCAN  Completed   Hepatitis C Screening  Completed   Zoster Vaccines- Shingrix  Completed   HPV VACCINES  Aged Out   COVID-19 Vaccine  Discontinued      Review of Systems Patient reports no vision/ hearing changes, adenopathy,fever, weight change,  persistant/recurrent hoarseness , swallowing issues, chest pain, palpitations, edema, persistant/recurrent cough, hemoptysis, dyspnea (rest/exertional/paroxysmal nocturnal), gastrointestinal bleeding (melena, rectal bleeding), abdominal pain, significant heartburn, bowel changes, GU symptoms (dysuria, hematuria, incontinence), Gyn symptoms (abnormal  bleeding, pain),  syncope, focal weakness, memory loss, numbness & tingling, skin/hair/nail changes, abnormal bruising or bleeding, anxiety, or depression.     Objective:   Physical Exam General Appearance:    Alert, cooperative, no distress, appears stated age  Head:    Normocephalic, without obvious abnormality, atraumatic  Eyes:    PERRL, conjunctiva/corneas clear, EOM's intact both eyes  Ears:    Normal TM's and external ear canals, both ears  Nose:   Nares normal, septum midline, mucosa  normal, no drainage    or sinus tenderness  Throat:   Lips, mucosa, and tongue normal; teeth and gums normal  Neck:   Supple, symmetrical, trachea midline, no adenopathy;    Thyroid: no enlargement/tenderness/nodules  Back:     Symmetric, no curvature, ROM normal, no CVA tenderness  Lungs:     Clear to auscultation bilaterally, respirations unlabored  Chest Wall:    No tenderness or deformity   Heart:    Regular rate and rhythm, S1 and S2 normal, no murmur, rub   or gallop  Breast Exam:    Deferred  Abdomen:     Soft, non-tender, bowel sounds active all four quadrants,    no masses, no organomegaly  Genitalia:    Deferred  Rectal:    Extremities:   Extremities normal, atraumatic, no cyanosis or edema  Pulses:   2+ and symmetric all extremities  Skin:   Skin color, texture, turgor normal, no rashes or lesions  Lymph nodes:   Cervical, supraclavicular, and axillary nodes normal  Neurologic:   CNII-XII intact, normal strength, sensation and reflexes    throughout          Assessment & Plan:

## 2023-03-20 NOTE — Assessment & Plan Note (Signed)
Chronic problem.  BP is elevated.  Pt is under considerable stress- daughter has moved home and has mental health issues.  Will add Amlodipine 5mg  daily and monitor BP.  Pt expressed understanding and is in agreement w/ plan.

## 2023-03-20 NOTE — Assessment & Plan Note (Signed)
Check labs and replete prn. 

## 2023-03-21 ENCOUNTER — Telehealth: Payer: Self-pay

## 2023-03-21 NOTE — Telephone Encounter (Signed)
-----   Message from Neena Rhymes sent at 03/21/2023  3:06 PM EDT ----- Labs look great!  No changes at this time

## 2023-03-21 NOTE — Telephone Encounter (Signed)
Patient has been informed of her lab results.

## 2023-03-24 ENCOUNTER — Other Ambulatory Visit (HOSPITAL_BASED_OUTPATIENT_CLINIC_OR_DEPARTMENT_OTHER): Payer: Self-pay | Admitting: Family Medicine

## 2023-03-24 DIAGNOSIS — M189 Osteoarthritis of first carpometacarpal joint, unspecified: Secondary | ICD-10-CM | POA: Insufficient documentation

## 2023-03-24 DIAGNOSIS — M654 Radial styloid tenosynovitis [de Quervain]: Secondary | ICD-10-CM | POA: Insufficient documentation

## 2023-03-24 DIAGNOSIS — Z1231 Encounter for screening mammogram for malignant neoplasm of breast: Secondary | ICD-10-CM

## 2023-03-27 ENCOUNTER — Ambulatory Visit (HOSPITAL_BASED_OUTPATIENT_CLINIC_OR_DEPARTMENT_OTHER)
Admission: RE | Admit: 2023-03-27 | Discharge: 2023-03-27 | Disposition: A | Discharge status: Home / Self Care | Payer: 59 | Source: Ambulatory Visit | Attending: Family Medicine | Admitting: Family Medicine

## 2023-03-27 DIAGNOSIS — Z1231 Encounter for screening mammogram for malignant neoplasm of breast: Secondary | ICD-10-CM | POA: Diagnosis present

## 2023-03-31 ENCOUNTER — Other Ambulatory Visit: Payer: Self-pay | Admitting: Family Medicine

## 2023-03-31 DIAGNOSIS — R928 Other abnormal and inconclusive findings on diagnostic imaging of breast: Secondary | ICD-10-CM

## 2023-04-09 ENCOUNTER — Ambulatory Visit (HOSPITAL_BASED_OUTPATIENT_CLINIC_OR_DEPARTMENT_OTHER): Payer: 59 | Admitting: Obstetrics & Gynecology

## 2023-04-10 ENCOUNTER — Other Ambulatory Visit: Payer: 59

## 2023-04-17 ENCOUNTER — Encounter: Payer: Self-pay | Admitting: Family Medicine

## 2023-04-17 ENCOUNTER — Ambulatory Visit: Payer: 59 | Admitting: Family Medicine

## 2023-04-17 VITALS — BP 124/64 | HR 64 | Temp 98.7°F | Ht 61.5 in | Wt 125.5 lb

## 2023-04-17 DIAGNOSIS — I10 Essential (primary) hypertension: Secondary | ICD-10-CM

## 2023-04-17 MED ORDER — ALBUTEROL SULFATE HFA 108 (90 BASE) MCG/ACT IN AERS: 2.0000 | INHALATION_SPRAY | Freq: Four times a day (QID) | RESPIRATORY_TRACT | 1 refills | Status: DC | PRN

## 2023-04-17 MED ORDER — AMLODIPINE BESYLATE 5 MG PO TABS: 5.0000 mg | ORAL_TABLET | Freq: Every day | ORAL | 3 refills | Status: DC

## 2023-04-17 NOTE — Progress Notes (Signed)
   Subjective:    Patient ID: Sonia Kim, female    DOB: 13-Jan-1956, 67 y.o.   MRN: [id number]  HPI HTN- chronic problem, at last visit we added Amlodipine 5mg .  BP is much better today.  Reports things are also improving at home.  No CP, SOB, HA's, visual changes, edema.  Home BP yesterday was 122.   Review of Systems For ROS see HPI     Objective:   Physical Exam Vitals reviewed.  Constitutional:      General: She is not in acute distress.    Appearance: Normal appearance. She is well-developed. She is not ill-appearing.  HENT:     Head: Normocephalic and atraumatic.  Eyes:     Conjunctiva/sclera: Conjunctivae normal.     Pupils: Pupils are equal, round, and reactive to light.  Neck:     Thyroid: No thyromegaly.  Cardiovascular:     Rate and Rhythm: Normal rate and regular rhythm.     Pulses: Normal pulses.     Heart sounds: Normal heart sounds. No murmur heard. Pulmonary:     Effort: Pulmonary effort is normal. No respiratory distress.     Breath sounds: Normal breath sounds.  Abdominal:     General: There is no distension.     Palpations: Abdomen is soft.     Tenderness: There is no abdominal tenderness.  Musculoskeletal:     Cervical back: Normal range of motion and neck supple.     Right lower leg: No edema.     Left lower leg: No edema.  Lymphadenopathy:     Cervical: No cervical adenopathy.  Skin:    General: Skin is warm and dry.  Neurological:     Mental Status: She is alert and oriented to person, place, and time.  Psychiatric:        Behavior: Behavior normal.           Assessment & Plan:

## 2023-04-17 NOTE — Assessment & Plan Note (Signed)
Improved w/ addition of Amlodipine 5mg  daily.  Currently asymptomatic.  No need to repeat labs.  Continue meds at this time.

## 2023-04-17 NOTE — Patient Instructions (Signed)
Follow up in 6 months to recheck BP and cholesterol Keep up the good work on healthy diet and regular exercise- you look great! Continue the Amlodipine daily for blood pressure Call with any questions or concerns Stay Safe!  Stay Healthy! Happy Holidays!!

## 2023-04-24 ENCOUNTER — Ambulatory Visit: Payer: 59

## 2023-04-24 ENCOUNTER — Ambulatory Visit
Admission: RE | Admit: 2023-04-24 | Discharge: 2023-04-24 | Disposition: A | Discharge status: Home / Self Care | Payer: 59 | Source: Ambulatory Visit | Attending: Family Medicine | Admitting: Family Medicine

## 2023-04-24 DIAGNOSIS — R928 Other abnormal and inconclusive findings on diagnostic imaging of breast: Secondary | ICD-10-CM

## 2023-05-11 ENCOUNTER — Other Ambulatory Visit: Payer: Self-pay

## 2023-05-11 ENCOUNTER — Ambulatory Visit
Admission: RE | Admit: 2023-05-11 | Discharge: 2023-05-11 | Disposition: A | Discharge status: Home / Self Care | Payer: 59 | Source: Ambulatory Visit | Attending: Family Medicine | Admitting: Family Medicine

## 2023-05-11 VITALS — BP 145/70 | HR 72 | Temp 98.2°F | Resp 16

## 2023-05-11 DIAGNOSIS — J4521 Mild intermittent asthma with (acute) exacerbation: Secondary | ICD-10-CM

## 2023-05-11 MED ORDER — PREDNISONE 20 MG PO TABS: ORAL_TABLET | ORAL | 0 refills | Status: DC

## 2023-05-11 MED ORDER — METHYLPREDNISOLONE SODIUM SUCC 125 MG IJ SOLR
60.0000 mg | Freq: Once | INTRAMUSCULAR | Status: AC
  Administered 2023-05-11: 60 mg via INTRAMUSCULAR

## 2023-05-11 NOTE — Discharge Instructions (Signed)
Begin prednisone Monday 05/12/23. Take plain guaifenesin (1200mg  extended release tabs such as Mucinex) twice daily, with plenty of water, for cough and congestion. Get adequate rest.   Continue inhalers and Singulair as prescribed. May take Delsym Cough Suppressant ("12 Hour Cough Relief") at bedtime for nighttime cough.   If symptoms become significantly worse during the night or over the weekend, proceed to the local emergency room.

## 2023-05-11 NOTE — ED Provider Notes (Signed)
[facility]    CSN: [id number] Arrival date & time: 05/11/23  1048      History   Chief Complaint Chief Complaint  Patient presents with   Wheezing    upper respiratory infection - Entered by patient    HPI Sonia Kim is a 67 y.o. female.   Patient complains of mild asthma flare-up during the past two weeks with increased cough and congestion.  She denies pleuritic pain, shortness of breath, and fevers, chills, and sweats.  She has had minimal improvement with her usual albuterol and Pulmicort inhalers.  The history is provided by the patient.    Past Medical History:  Diagnosis Date   Allergy    Dogs, dust, and mold   Anxiety    Arthritis    Asthma    Benign essential HTN 08/31/2015   Chronic neck pain    Colon polyps    Depression    Heart murmur    Hypercholesteremia    Hyperlipidemia 05/2005   Hypothyroid 06/2009   Osteoarthritis of thoracic spine    Shingles 06/2011    Patient Active Problem List   Diagnosis Date Noted   Osteoarthritis of carpometacarpal [facility]) joint of thumb 03/24/2023   Radial styloid tenosynovitis of right hand 03/24/2023   Bilateral hand pain 12/06/2022   Calcification of abdominal aorta (HCC) 04/06/2021   HTN (hypertension) 11/19/2018   Shingles 04/25/2017   Allergic rhinitis 04/10/2017   Physical exam 02/14/2017   Hyperlipidemia 08/14/2016   Anxiety 08/14/2016   Nonallopathic lesion of cervical region 06/06/2016   Nonallopathic lesion of thoracic region 06/06/2016   Nonallopathic lesion of lumbosacral region 06/06/2016   Nonallopathic lesion of sacral region 06/06/2016   Polyarthralgia 05/09/2016   Strain of left piriformis muscle 04/09/2016   Heart palpitations 08/31/2015   Heart murmur 08/31/2015   Greater trochanteric bursitis of both hips 04/05/2015   Hypothyroid 01/16/2015   Vitamin D deficiency 01/16/2015    Past Surgical History:  Procedure Laterality Date   TUBAL LIGATION  1984 & 05/2005    reversal in 1995   tubal reversal  1995    OB History     Gravida  3   Para  3   Term  0   Preterm  0   AB  0   Living  3      SAB  0   IAB  0   Ectopic  0   Multiple  0   Live Births  3            Home Medications    Prior to Admission medications   Medication Sig Start Date End Date Taking? Authorizing Provider  budesonide (PULMICORT) 180 MCG/ACT inhaler Inhale into the lungs 2 (two) times daily.   Yes [provider]  predniSONE (DELTASONE) 20 MG tablet Take one tab by mouth twice daily for 4 days, then one daily for 3 days. Take with food. 05/11/23  Yes Lattie Haw, MD  albuterol (VENTOLIN HFA) 108 (90 Base) MCG/ACT inhaler Inhale 2 puffs into the lungs every 6 (six) hours as needed for wheezing or shortness of breath. 04/17/23   Sheliah Hatch, MD  amLODipine (NORVASC) 5 MG tablet Take 1 tablet (5 mg total) by mouth daily. 04/17/23   Sheliah Hatch, MD  Biotin 1000 MCG CHEW Chew 1,000 mg by mouth.    [provider]  co-enzyme Q-10 30 MG capsule Take 30 mg by mouth 3 (three) times daily.  [provider]  levothyroxine (SYNTHROID) 50 MCG tablet TAKE 1 TABLET BY MOUTH DAILY  BEFORE BREAKFAST 01/27/23   Sheliah Hatch, MD  meloxicam (MOBIC) 15 MG tablet Take 1 tablet (15 mg total) by mouth daily. 12/17/22   Alveria Apley, NP  Olopatadine HCl (PATADAY) 0.2 % SOLN Apply 1 drop to eye daily. 07/05/21   Sheliah Hatch, MD  ondansetron (ZOFRAN) 4 MG tablet Take 1 tablet (4 mg total) by mouth every 8 (eight) hours as needed for nausea or vomiting. 10/15/22   Alveria Apley, NP  rosuvastatin (CRESTOR) 10 MG tablet Take 1 tablet (10 mg total) by mouth daily. 09/17/22   Sheliah Hatch, MD  sertraline (ZOLOFT) 25 MG tablet TAKE 1 TABLET BY MOUTH DAILY 12/20/22   Sheliah Hatch, MD  VITAMIN D PO Take 2,000 Int'l Units by mouth.    [provider]    Family History Family History  Problem  Relation Age of Onset   Thyroid disease Mother    Lung cancer Mother    CVA Father    Diabetes Father    Hyperlipidemia Father    Hashimoto's thyroiditis Sister    CVA Paternal Grandfather    CVA Paternal Grandmother    Breast cancer Neg Hx     Social History Social History   Tobacco Use   Smoking status: Former    Current packs/day: 0.00    Average packs/day: 0.5 packs/day for 25.0 years (12.5 ttl pk-yrs)    Types: Cigarettes    Start date: 02/26/1980    Quit date: 02/25/2005    Years since quitting: 18.2   Smokeless tobacco: Never  Vaping Use   Vaping status: Never Used  Substance Use Topics   Alcohol use: Yes    Alcohol/week: 5.0 standard drinks of alcohol    Types: 5 Glasses of wine per week   Drug use: No     Allergies   Lisinopril and Oxycodone-acetaminophen   Review of Systems Review of Systems No sore throat + cough No pleuritic pain ? wheezing + nasal congestion + post-nasal drainage No sinus pain/pressure No itchy/red eyes No earache No hemoptysis No SOB No fever/chills No nausea No vomiting No abdominal pain No diarrhea No urinary symptoms No skin rash No fatigue No myalgias No headache Used OTC meds (Allegra) without relief   Physical Exam Triage Vital Signs ED Triage Vitals  Encounter Vitals Group     BP 05/11/23 1145 (!) 145/70     Systolic BP Percentile --      Diastolic BP Percentile --      Pulse Rate 05/11/23 1145 72     Resp 05/11/23 1145 16     Temp 05/11/23 1145 98.2 F (36.8 C)     Temp src --      SpO2 --      Weight --      Height --      Head Circumference --      Peak Flow --      Pain Score 05/11/23 1144 0     Pain Loc --      Pain Education --      Exclude from Growth Chart --    No data found.  Updated Vital Signs BP (!) 145/70   Pulse 72   Temp 98.2 F (36.8 C)   Resp 16   LMP 03/10/2010   Visual Acuity Right Eye Distance:   Left Eye Distance:   Bilateral Distance:  Right Eye Near:    Left Eye Near:    Bilateral Near:     Physical Exam Nursing notes and Vital Signs reviewed. Appearance:  Patient appears stated age, and in no acute distress Eyes:  Pupils are equal, round, and reactive to light and accomodation.  Extraocular movement is intact.  Conjunctivae are not inflamed  Ears:  Canals normal.  Tympanic membranes normal.  Nose:  Mildly congested turbinates.  No sinus tenderness.  Pharynx:  Normal Neck:  Supple. No adenopathy   Lungs: Few faint scattered wheezes.  Breath sounds are equal.  Moving air well. Heart:  Regular rate and rhythm without murmurs, rubs, or gallops.  Abdomen:  Nontender without masses or hepatosplenomegaly.  Bowel sounds are present.  No CVA or flank tenderness.  Extremities:  No edema.  Skin:  No rash present.   UC Treatments / Results  Labs (all labs ordered are listed, but only abnormal results are displayed) Labs Reviewed - No data to display  EKG   Radiology No results found.  Procedures Procedures (including critical care time)  Medications Ordered in UC Medications  methylPREDNISolone sodium succinate (SOLU-MEDROL) 125 mg/2 mL injection 60 mg (60 mg Intramuscular Given 05/11/23 1236)    Initial Impression / Assessment and Plan / UC Course  I have reviewed the triage vital signs and the nursing notes.  Pertinent labs & imaging results that were available during my care of the patient were reviewed by me and considered in my medical decision making (see chart for details).    There is no evidence of bacterial infection today.   Administered Solumedrol 60mg  IM.  Begin prednisone burst/taper tomorrow. Followup with PCP or urgent care if not improving 4 to 5 days.  Final Clinical Impressions(s) / UC Diagnoses   Final diagnoses:  Mild intermittent asthma with exacerbation     Discharge Instructions      Begin prednisone Monday 05/12/23. Take plain guaifenesin (1200mg  extended release tabs such as Mucinex) twice  daily, with plenty of water, for cough and congestion. Get adequate rest.   Continue inhalers and Singulair as prescribed. May take Delsym Cough Suppressant ("12 Hour Cough Relief") at bedtime for nighttime cough.   If symptoms become significantly worse during the night or over the weekend, proceed to the local emergency room.            ED Prescriptions     Medication Sig Dispense Auth. Provider   predniSONE (DELTASONE) 20 MG tablet Take one tab by mouth twice daily for 4 days, then one daily for 3 days. Take with food. 11 tablet Lattie Haw, MD         Lattie Haw, MD 05/13/23 [phone]

## 2023-05-11 NOTE — ED Triage Notes (Signed)
Pt presents to uc with co of asthma flare up for 2 weeks with worsening of symptoms. Cough congestion. She has been using inhaler with no improvement.

## 2023-05-16 ENCOUNTER — Telehealth: Payer: Self-pay | Admitting: Family Medicine

## 2023-05-16 NOTE — Telephone Encounter (Signed)
Per her pt instructions, she is to continue taking the Amlodipine daily for BP

## 2023-05-16 NOTE — Telephone Encounter (Signed)
I am assuming because there was no mention of changing regimen you would like her to continue current treatment? Please advise

## 2023-05-16 NOTE — Telephone Encounter (Signed)
Caller name: Foster Stogsdill  On DPR?: Yes  Call back number: [phone] (mobile)  Provider they see: Sheliah Hatch, MD  Reason for call:   Pt asking do you want her to stay on BP meds - wasn't discussed at last meeting- advise

## 2023-05-16 NOTE — Telephone Encounter (Signed)
Pt had questions due to not receiving  the medication from mail in pharmacy . I informed pt this was sent in 04/17/2023 at [facility] and to contact them for update. If she has any issues to give [place] a call

## 2023-05-21 ENCOUNTER — Ambulatory Visit: Payer: 59 | Admitting: Family Medicine

## 2023-05-30 DIAGNOSIS — M25571 Pain in right ankle and joints of right foot: Secondary | ICD-10-CM | POA: Insufficient documentation

## 2023-06-10 ENCOUNTER — Ambulatory Visit (INDEPENDENT_AMBULATORY_CARE_PROVIDER_SITE_OTHER): Payer: 59 | Admitting: Family Medicine

## 2023-06-10 ENCOUNTER — Encounter: Payer: Self-pay | Admitting: Family Medicine

## 2023-06-10 VITALS — BP 120/62 | HR 79 | Temp 98.7°F | Ht 61.5 in | Wt 127.0 lb

## 2023-06-10 DIAGNOSIS — B9689 Other specified bacterial agents as the cause of diseases classified elsewhere: Secondary | ICD-10-CM | POA: Diagnosis not present

## 2023-06-10 DIAGNOSIS — J329 Chronic sinusitis, unspecified: Secondary | ICD-10-CM

## 2023-06-10 DIAGNOSIS — J4541 Moderate persistent asthma with (acute) exacerbation: Secondary | ICD-10-CM

## 2023-06-10 MED ORDER — QVAR REDIHALER 80 MCG/ACT IN AERB: 1.0000 | INHALATION_SPRAY | Freq: Two times a day (BID) | RESPIRATORY_TRACT | 3 refills | Status: DC

## 2023-06-10 MED ORDER — BENZONATATE 200 MG PO CAPS: 200.0000 mg | ORAL_CAPSULE | Freq: Three times a day (TID) | ORAL | 0 refills | Status: AC | PRN

## 2023-06-10 MED ORDER — DOXYCYCLINE HYCLATE 100 MG PO TABS: 100.0000 mg | ORAL_TABLET | Freq: Two times a day (BID) | ORAL | 0 refills | Status: DC

## 2023-06-10 NOTE — Patient Instructions (Addendum)
 Follow up as needed START Doxycycline  twice daily- take w/ food TAKE the Qvar  twice daily EVERY DAY regardless of how you feel (this helps prevent asthma exacerbations) USE the Albuterol  as needed for cough/wheeze TAKE the cough pills as needed.  This can be used in addition to mucinex  or delsym Drink LOTS of fluids REST! Call with any questions or concerns Hang in there!

## 2023-06-10 NOTE — Progress Notes (Signed)
   Subjective:    Patient ID: Sonia Kim, female    DOB: 1955-10-15, 67 y.o.   MRN: [id number]  HPI Cough- pt went to ER on 12/1 for cough and was started on steroids for intermittent asthma exacerbation.  + SOB- particularly at night.  Not currently taking any inhalers w/ exception of albuterol  as needed.  Pt has multiple outdoor allergies.  Currently on Allegra and Singulair .  No fevers.  + sinus pressure.  + tooth pain.  Intermittent ear fullness.   Review of Systems For ROS see HPI     Objective:   Physical Exam Vitals reviewed.  Constitutional:      General: She is not in acute distress.    Appearance: Normal appearance. She is well-developed. She is not ill-appearing.  HENT:     Head: Normocephalic and atraumatic.     Right Ear: Tympanic membrane and ear canal normal.     Left Ear: Tympanic membrane and ear canal normal.     Nose: Mucosal edema and congestion present. No rhinorrhea.     Right Sinus: Maxillary sinus tenderness present. No frontal sinus tenderness.     Left Sinus: Maxillary sinus tenderness present. No frontal sinus tenderness.     Mouth/Throat:     Pharynx: Uvula midline. Posterior oropharyngeal erythema present. No oropharyngeal exudate.  Eyes:     Conjunctiva/sclera: Conjunctivae normal.     Pupils: Pupils are equal, round, and reactive to light.  Cardiovascular:     Rate and Rhythm: Normal rate and regular rhythm.     Heart sounds: Normal heart sounds.  Pulmonary:     Effort: Pulmonary effort is normal. No respiratory distress.     Breath sounds: Wheezing (faint inspiratory wheezes throughout) present.     Comments: + wet cough Musculoskeletal:     Cervical back: Normal range of motion and neck supple.  Lymphadenopathy:     Cervical: No cervical adenopathy.  Skin:    General: Skin is warm and dry.  Neurological:     General: No focal deficit present.     Mental Status: She is alert and oriented to person, place, and time.     Cranial  Nerves: No cranial nerve deficit.     Motor: No weakness.     Coordination: Coordination normal.  Psychiatric:        Mood and Affect: Mood normal.        Behavior: Behavior normal.        Thought Content: Thought content normal.           Assessment & Plan:  Asthma exacerbation- deteriorated.  Pt has hx of similar.  Admits she is not taking daily controller medication and only using albuterol  prn.  Given her wheezing and worsening SOB will start Qvar  BID.  Reviewed with pt that this is every day- regardless of how she feels.  Continue Albuterol  prn.  Reviewed supportive care and red flags that should prompt return.  Pt expressed understanding and is in agreement w/ plan.   Bacterial sinusitis- new.  Pt's sxs and PE consistent w/ infxn.  Start Doxycycline  BIDx10 days.  Reviewed supportive care and red flags that should prompt return.  Pt expressed understanding and is in agreement w/ plan.

## 2023-06-15 ENCOUNTER — Encounter: Payer: Self-pay | Admitting: Family Medicine

## 2023-06-17 ENCOUNTER — Encounter: Payer: Self-pay | Admitting: Family

## 2023-06-17 ENCOUNTER — Other Ambulatory Visit: Payer: Self-pay | Admitting: Family Medicine

## 2023-06-17 ENCOUNTER — Ambulatory Visit: Payer: 59 | Admitting: Family

## 2023-06-17 VITALS — BP 130/74 | HR 76 | Temp 98.6°F | Ht 61.5 in | Wt 127.0 lb

## 2023-06-17 DIAGNOSIS — J209 Acute bronchitis, unspecified: Secondary | ICD-10-CM | POA: Diagnosis not present

## 2023-06-17 DIAGNOSIS — R051 Acute cough: Secondary | ICD-10-CM | POA: Diagnosis not present

## 2023-06-17 MED ORDER — PREDNISONE 20 MG PO TABS: 40.0000 mg | ORAL_TABLET | Freq: Every day | ORAL | 0 refills | Status: DC

## 2023-06-17 MED ORDER — AMOXICILLIN 500 MG PO CAPS: 500.0000 mg | ORAL_CAPSULE | Freq: Three times a day (TID) | ORAL | 0 refills | Status: AC

## 2023-06-17 NOTE — Telephone Encounter (Signed)
 Called patient and made her appt to come in office today to see Webb,NP . Patient reports no chest pain.

## 2023-06-17 NOTE — Progress Notes (Signed)
 Acute Office Visit  Subjective:     Patient ID: Sonia Kim, female    DOB: November 18, 1955, 68 y.o.   MRN: [id number]  Chief Complaint  Patient presents with  . Cough    Pt notes she was advised to stop Doxy so has not been taking that, notes continued cough and discomfort in the chest     HPI Patient is in today with persistent cough and chest congestion. She was treated with an antibiotic and prednisone  last month but was unable to tolerate the Doxycycline  as it was harsh on her stomach.   Review of Systems  Constitutional: Negative.  Negative for chills and fever.  HENT:  Positive for congestion.   Respiratory:  Positive for cough, sputum production and wheezing.   Cardiovascular: Negative.   Musculoskeletal: Negative.   Skin: Negative.   All other systems reviewed and are negative. Past Medical History:  Diagnosis Date  . Allergy    Dogs, dust, and mold  . Anxiety   . Arthritis   . Asthma   . Benign essential HTN 08/31/2015  . Chronic neck pain   . Colon polyps   . Depression   . Heart murmur   . Hypercholesteremia   . Hyperlipidemia 05/2005  . Hypothyroid 06/2009  . Osteoarthritis of thoracic spine   . Shingles 06/2011    Social History   Socioeconomic History  . Marital status: Married    Spouse name: Not on file  . Number of children: 3  . Years of education: Not on file  . Highest education level: Not on file  Occupational History  . Not on file  Tobacco Use  . Smoking status: Former    Current packs/day: 0.00    Average packs/day: 0.5 packs/day for 25.0 years (12.5 ttl pk-yrs)    Types: Cigarettes    Start date: 02/26/1980    Quit date: 02/25/2005    Years since quitting: 18.3  . Smokeless tobacco: Never  Vaping Use  . Vaping status: Never Used  Substance and Sexual Activity  . Alcohol use: Yes    Alcohol/week: 5.0 standard drinks of alcohol    Types: 5 Glasses of wine per week  . Drug use: No  . Sexual activity: Not Currently     Partners: Male    Birth control/protection: Surgical    Comment: BTL  Other Topics Concern  . Not on file  Social History Narrative  . Not on file   Social Drivers of Health   Financial Resource Strain: Not on file  Food Insecurity: Low Risk  (11/26/2022)   Received from [facility]   Hunger Vital Sign   . Worried About Programme Researcher, Broadcasting/film/video in the Last Year: Never true   . Ran Out of Food in the Last Year: Never true  Transportation Needs: Not on file (11/26/2022)  Physical Activity: Not on file  Stress: Not on file  Social Connections: Not on file  Intimate Partner Violence: Not on file    Past Surgical History:  Procedure Laterality Date  . TUBAL LIGATION  1984 & 05/2005   reversal in 1995  . tubal reversal  1995    Family History  Problem Relation Age of Onset  . Thyroid  disease Mother   . Lung cancer Mother   . CVA Father   . Diabetes Father   . Hyperlipidemia Father   . Hashimoto's thyroiditis Sister   . CVA Paternal Grandfather   . CVA Paternal Grandmother   .  Breast cancer Neg Hx     Allergies  Allergen Reactions  . Lisinopril Cough  . Oxycodone-Acetaminophen Nausea Only    Current Outpatient Medications on File Prior to Visit  Medication Sig Dispense Refill  . albuterol  (VENTOLIN  HFA) 108 (90 Base) MCG/ACT inhaler Inhale 2 puffs into the lungs every 6 (six) hours as needed for wheezing or shortness of breath. 8 g 1  . amLODipine  (NORVASC ) 5 MG tablet Take 1 tablet (5 mg total) by mouth daily. 90 tablet 3  . beclomethasone (QVAR  REDIHALER) 80 MCG/ACT inhaler Inhale 1 puff into the lungs 2 (two) times daily. 1 each 3  . benzonatate  (TESSALON ) 200 MG capsule Take 1 capsule (200 mg total) by mouth 3 (three) times daily as needed for cough. 30 capsule 0  . Biotin 1000 MCG CHEW Chew 1,000 mg by mouth.    . co-enzyme Q-10 30 MG capsule Take 30 mg by mouth 3 (three) times daily.    . levothyroxine  (SYNTHROID ) 50 MCG tablet TAKE 1 TABLET BY MOUTH  DAILY  BEFORE BREAKFAST 90 tablet 3  . meloxicam  (MOBIC ) 15 MG tablet Take 1 tablet (15 mg total) by mouth daily. 30 tablet 0  . Olopatadine  HCl (PATADAY ) 0.2 % SOLN Apply 1 drop to eye daily. 2.5 mL 3  . ondansetron  (ZOFRAN ) 4 MG tablet Take 1 tablet (4 mg total) by mouth every 8 (eight) hours as needed for nausea or vomiting. 20 tablet 0  . rosuvastatin  (CRESTOR ) 10 MG tablet Take 1 tablet (10 mg total) by mouth daily. 90 tablet 3  . sertraline  (ZOLOFT ) 25 MG tablet TAKE 1 TABLET BY MOUTH DAILY 90 tablet 3  . VITAMIN D  PO Take 2,000 Int'l Units by mouth.     No current facility-administered medications on file prior to visit.    BP 130/74   Pulse 76   Temp 98.6 F (37 C) (Temporal)   Ht 5' 1.5 (1.562 m)   Wt 127 lb (57.6 kg)   LMP 03/10/2010   SpO2 98%   BMI 23.61 kg/m chart      Objective:    BP 130/74   Pulse 76   Temp 98.6 F (37 C) (Temporal)   Ht 5' 1.5 (1.562 m)   Wt 127 lb (57.6 kg)   LMP 03/10/2010   SpO2 98%   BMI 23.61 kg/m    Physical Exam Vitals and nursing note reviewed.  Constitutional:      Appearance: Normal appearance. She is normal weight.  HENT:     Right Ear: Tympanic membrane, ear canal and external ear normal.     Left Ear: Tympanic membrane, ear canal and external ear normal.     Nose: Congestion present. No rhinorrhea.     Mouth/Throat:     Mouth: Mucous membranes are moist.  Cardiovascular:     Rate and Rhythm: Normal rate and regular rhythm.     Pulses: Normal pulses.     Heart sounds: Normal heart sounds.  Pulmonary:     Effort: Pulmonary effort is normal.     Breath sounds: Wheezing present. No rales.  Musculoskeletal:        General: Normal range of motion.  Skin:    General: Skin is warm and dry.  Neurological:     General: No focal deficit present.     Mental Status: She is alert and oriented to person, place, and time.  Psychiatric:        Mood and Affect: Mood normal.  Behavior: Behavior normal.   No results  found for any visits on 06/17/23.      Assessment & Plan:   Problem List Items Addressed This Visit   None Visit Diagnoses       Acute cough    -  Primary   Relevant Orders   DG Chest 2 View     Acute bronchitis, unspecified organism       Relevant Orders   DG Chest 2 View       Meds ordered this encounter  Medications  . predniSONE  (DELTASONE ) 20 MG tablet    Sig: Take 2 tablets (40 mg total) by mouth daily with breakfast.    Dispense:  10 tablet    Refill:  0  . amoxicillin  (AMOXIL ) 500 MG capsule    Sig: Take 1 capsule (500 mg total) by mouth 3 (three) times daily for 10 days.    Dispense:  30 capsule    Refill:  0   Call the office if symptoms worsen or persist. Recheck as scheduled and sooner as needed.  No follow-ups on file.  Dalaney Needle B Carren Blakley, FNP

## 2023-07-01 NOTE — Telephone Encounter (Signed)
I'm sorry there was some confusion when you saw Padonda.  The chest xray has been ordered, you just need to go to [facility] and have this done.  You enter through the main entrance, elevators are on the L, go down 1 floor to radiology.  No appt needed

## 2023-07-01 NOTE — Telephone Encounter (Signed)
Copied from CRM [id number]. Topic: Clinical - Request for Lab/Test Order >> Jul 01, 2023  8:08 AM Sonia Kim wrote: Reason for CRM: Pt called stating she never received a call for the chest xray for her respiratory issue and pt states that the issue is coming back

## 2023-07-07 ENCOUNTER — Telehealth: Payer: Self-pay | Admitting: Family Medicine

## 2023-07-07 NOTE — Telephone Encounter (Signed)
Patient has upper respiratory infection Pt does not want to come in office before her appt 07/10/2023

## 2023-07-07 NOTE — Telephone Encounter (Signed)
I don't understand this message.  It doesn't say why she was called or why her appt was attempted to be rescheduled

## 2023-07-07 NOTE — Telephone Encounter (Signed)
Pt was called and would prefer to keep the visit she has.

## 2023-07-07 NOTE — Telephone Encounter (Signed)
I don't see any note from the 21st in pt's chart.  So I'm very confused as to what is going on.  But these messages either need to be linked together or made much more clear.

## 2023-07-08 NOTE — Telephone Encounter (Signed)
Sorry for any confusion I spoke to pt she only wanted to see Tabori and wait for her original appt.

## 2023-07-10 ENCOUNTER — Encounter: Payer: Self-pay | Admitting: Family Medicine

## 2023-07-10 ENCOUNTER — Ambulatory Visit (INDEPENDENT_AMBULATORY_CARE_PROVIDER_SITE_OTHER): Payer: 59 | Admitting: Family Medicine

## 2023-07-10 VITALS — BP 122/58 | HR 75 | Temp 98.6°F | Ht 61.5 in | Wt 127.5 lb

## 2023-07-10 DIAGNOSIS — J209 Acute bronchitis, unspecified: Secondary | ICD-10-CM | POA: Diagnosis not present

## 2023-07-10 MED ORDER — PREDNISONE 10 MG PO TABS: ORAL_TABLET | ORAL | 0 refills | Status: DC

## 2023-07-10 NOTE — Progress Notes (Signed)
   Subjective:    Patient ID: Sonia Kim, female    DOB: [birth year], 68 y.o.   MRN: [id number]  HPI URI- pt was seen on 12/31 for asthma exacerbation.  Was tx'd w/ Qvar and Doxycycline for bacterial sinusitis.  Was then seen on 1/7 and tx'd w/ Amoxicillin and Prednisone x5 days.  CXR was ordered but this not done.  Today pt reports 'i just can't kick it'.  Still has chest congestion and cough.  Continues to have SOB.  No fever.  Has nocturnal wheeze.  Using Qvar twice daily, has only used Albuterol 2-3x in 5 days.     Review of Systems For ROS see HPI     Objective:   Physical Exam Vitals reviewed.  Constitutional:      General: She is not in acute distress.    Appearance: Normal appearance. She is not ill-appearing.  HENT:     Head: Normocephalic and atraumatic.     Right Ear: Tympanic membrane and ear canal normal.     Left Ear: Tympanic membrane and ear canal normal.     Nose: Congestion present. No rhinorrhea.     Mouth/Throat:     Pharynx: Oropharynx is clear. No oropharyngeal exudate or posterior oropharyngeal erythema.  Eyes:     Extraocular Movements: Extraocular movements intact.     Conjunctiva/sclera: Conjunctivae normal.  Cardiovascular:     Rate and Rhythm: Normal rate and regular rhythm.  Pulmonary:     Effort: Pulmonary effort is normal. No respiratory distress.     Breath sounds: No wheezing or rhonchi.  Musculoskeletal:     Cervical back: Neck supple.  Lymphadenopathy:     Cervical: No cervical adenopathy.  Neurological:     General: No focal deficit present.     Mental Status: She is alert and oriented to person, place, and time.  Psychiatric:        Mood and Affect: Mood normal.        Behavior: Behavior normal.        Thought Content: Thought content normal.           Assessment & Plan:  Acute bronchitis- ongoing.  Pt has had cough, congestion, SOB, and wheezing since late December.  Has completed 2 rounds of abx and 1 burst of  Prednisone.  Never had CXR that was ordered earlier this month.  Encouraged pt to get CXR as ordered.  Will do longer steroid taper.  Reviewed supportive care and red flags that should prompt return.  Pt expressed understanding and is in agreement w/ plan.

## 2023-07-10 NOTE — Patient Instructions (Signed)
Go to [facility] for your xray (enter the main door, elevators to the L, go down 1 floor to radiology, no appt needed)  START the Prednisone as directed- 3 pills at the same time x3 days, then 2 pills at the same time x3 days, then 1 pill daily.  Take w/ food   CONTINUE the Qvar twice daily to control airway inflammation  USE the Albuterol as needed  Take Mucinex DM to help w/ cough and to thin congestion  Drink lots of fluids!  REST!  Call with any questions or concern  Hang in there!

## 2023-07-11 ENCOUNTER — Encounter (HOSPITAL_BASED_OUTPATIENT_CLINIC_OR_DEPARTMENT_OTHER): Payer: Self-pay | Admitting: Radiology

## 2023-07-11 ENCOUNTER — Ambulatory Visit (HOSPITAL_BASED_OUTPATIENT_CLINIC_OR_DEPARTMENT_OTHER)
Admission: RE | Admit: 2023-07-11 | Discharge: 2023-07-11 | Disposition: A | Discharge status: Home / Self Care | Payer: 59 | Source: Ambulatory Visit | Attending: Family | Admitting: Family

## 2023-07-11 DIAGNOSIS — R051 Acute cough: Secondary | ICD-10-CM | POA: Insufficient documentation

## 2023-07-11 DIAGNOSIS — J209 Acute bronchitis, unspecified: Secondary | ICD-10-CM | POA: Insufficient documentation

## 2023-07-16 ENCOUNTER — Telehealth: Payer: Self-pay

## 2023-07-16 NOTE — Telephone Encounter (Signed)
 No result in the chart  Called Radiology reading room to get this expedited to us .

## 2023-07-16 NOTE — Telephone Encounter (Signed)
 Copied from CRM [id number]. Topic: Clinical - Request for Lab/Test Order >> Jul 16, 2023 12:18 PM Sonia Kim wrote: Reason for CRM: patient is calling in regarding her test results for her xray  she wants to know if there are back yet

## 2023-07-17 NOTE — Telephone Encounter (Signed)
 It was just resulted yesterday and was sent to [facility]- so I apologize for the delay.  It looks like there is interstitial pneumonitis which is diffuse inflammation of the lungs.  Since we're not sure of the cause, I want to refer to pulmonary for a complete evaluation and any treatment going forward (refer pulm, dx interstitial pneumonitis)

## 2023-07-18 ENCOUNTER — Other Ambulatory Visit: Payer: Self-pay

## 2023-07-18 ENCOUNTER — Telehealth: Payer: Self-pay | Admitting: Family Medicine

## 2023-07-18 DIAGNOSIS — J8489 Other specified interstitial pulmonary diseases: Secondary | ICD-10-CM

## 2023-07-18 NOTE — Telephone Encounter (Signed)
 Copied from CRM [id number]. Topic: Clinical - Lab/Test Results >> Jul 18, 2023  9:35 AM Leotis ORN wrote: Reason for CRM: patient is calling in regards to her x ray results, she stated she is tired of waiting for a callback, this is her 3rd time calling in and she wants answers now.  Informed patient that the results were delayed, and just received yesterday afternoon, and that as soon as the CMA is finished with patients and has had time to review the imaging results she will get a call back.  Patient verbalized understanding and asked to be called at her cell [phone] (M)

## 2023-07-18 NOTE — Telephone Encounter (Signed)
 I was at front desk alone Dr Ester Helms found Sonia Kim with a patient in clinic, so I told CRM rep to leave a message and Sonia Kim would respond when she available.

## 2023-07-18 NOTE — Telephone Encounter (Signed)
 LVM for her to return my call about xray results. Pulm referral placed

## 2023-07-18 NOTE — Telephone Encounter (Signed)
 Copied from CRM [id number]. Topic: General - Other >> Jul 18, 2023  1:54 PM Macario HERO wrote: Reason for CRM: Patient is calling regarding a missed call from Burnard Gaskins. Reached out to CAL and was advised she is currently with a patient. Will follow up with patient.

## 2023-07-21 NOTE — Telephone Encounter (Signed)
 Left vm to call office

## 2023-07-21 NOTE — Telephone Encounter (Signed)
 Noted. Left vm today to call office

## 2023-07-22 ENCOUNTER — Other Ambulatory Visit: Payer: Self-pay | Admitting: Family Medicine

## 2023-07-22 NOTE — Telephone Encounter (Signed)
Comments are found at bottom of thread.  We have been trying to get in touch w/ her

## 2023-07-22 NOTE — Telephone Encounter (Signed)
Letter out.

## 2023-07-22 NOTE — Telephone Encounter (Signed)
Do you have comments on patient CXR from 07/11/23?

## 2023-07-29 NOTE — Telephone Encounter (Signed)
Patient finally called back and was delivered results from CXR and voiced understanding pul had not called so provided the phone number for their office

## 2023-07-31 ENCOUNTER — Encounter: Payer: Self-pay | Admitting: Family Medicine

## 2023-08-01 ENCOUNTER — Encounter: Payer: Self-pay | Admitting: Family Medicine

## 2023-08-01 ENCOUNTER — Ambulatory Visit: Payer: 59 | Admitting: Family Medicine

## 2023-08-01 VITALS — BP 136/72 | HR 68 | Temp 97.5°F | Ht 61.5 in | Wt 129.1 lb

## 2023-08-01 DIAGNOSIS — R339 Retention of urine, unspecified: Secondary | ICD-10-CM

## 2023-08-01 DIAGNOSIS — R3911 Hesitancy of micturition: Secondary | ICD-10-CM

## 2023-08-01 LAB — POC URINALSYSI DIPSTICK (AUTOMATED)
Bilirubin, UA: NEGATIVE
Blood, UA: NEGATIVE
Glucose, UA: NEGATIVE
Ketones, UA: NEGATIVE
Leukocytes, UA: NEGATIVE
Nitrite, UA: NEGATIVE
Protein, UA: NEGATIVE
Spec Grav, UA: <=1.005 — AB (ref 1.010–1.025)
Urobilinogen, UA: 0.2 U/dL
pH, UA: 7 (ref 5.0–8.0)

## 2023-08-01 NOTE — Patient Instructions (Signed)
Follow up as needed or as scheduled I suspect the bladder hesitancy is due to bladder positioning Try and alter your posture when voiding to see if that helps Continue to drink LOTS of water If symptoms change or worsen, let me know and we can refer to urology Call with any questions or concerns Stay Safe!  Stay Healthy!

## 2023-08-01 NOTE — Progress Notes (Signed)
   Subjective:    Patient ID: Sonia Kim, female    DOB: 09-Feb-1956, 68 y.o.   MRN: [id number]  HPI Urinary issue- pt reports some difficulty w/ urinating since taking abx and prednisone.  Sxs started late January.  Feels that sxs are worse at night and has to just sit and wait for urination to start.  This is very unusual for her.  Sxs are occurring daily.  Pt admits she is not a good water drinker in the winter.     Review of Systems For ROS see HPI     Objective:   Physical Exam Vitals reviewed.  Constitutional:      General: She is not in acute distress.    Appearance: Normal appearance. She is well-developed. She is not ill-appearing.  Abdominal:     General: There is no distension.     Palpations: Abdomen is soft.     Tenderness: There is no abdominal tenderness (no suprapubic or CVA tenderness).  Skin:    General: Skin is warm and dry.  Neurological:     General: No focal deficit present.     Mental Status: She is alert and oriented to person, place, and time.  Psychiatric:        Mood and Affect: Mood normal.        Behavior: Behavior normal.        Thought Content: Thought content normal.           Assessment & Plan:  Urinary hesitancy- new.  Pt reports sxs are more noticeable at night.  Began ~3 weeks ago.  States she will have to wait 20-30 seconds to start voiding.  Discussed that as we age, the angle of the bladder can change which can change our voiding patterns.  Discussed changing posture or even pushing on bladder if needed for more complete emptying.  Thankfully UA is normal, no pain noted on exam.  If symptoms change or worsen she will let me know and we will refer to urology.  Pt expressed understanding and is in agreement w/ plan.

## 2023-08-29 DIAGNOSIS — M545 Low back pain, unspecified: Secondary | ICD-10-CM | POA: Insufficient documentation

## 2023-09-18 ENCOUNTER — Ambulatory Visit: Payer: 59 | Admitting: Internal Medicine

## 2023-09-19 DIAGNOSIS — R2232 Localized swelling, mass and lump, left upper limb: Secondary | ICD-10-CM | POA: Insufficient documentation

## 2023-10-15 ENCOUNTER — Ambulatory Visit: Payer: 59 | Admitting: Family Medicine

## 2023-11-10 ENCOUNTER — Ambulatory Visit: Admitting: Internal Medicine

## 2023-11-13 ENCOUNTER — Ambulatory Visit: Payer: Self-pay

## 2023-11-13 NOTE — Telephone Encounter (Signed)
 FYI Only or Action Required?: Action required by provider  Patient was last seen in primary care on 08/01/2023 by Jess Morita, MD. Called Nurse Triage reporting Back Pain. Symptoms began today. Interventions attempted: Nothing. Symptoms are: gradually worsening.  Triage Disposition: See PCP When Office is Open (Within 3 Days)-  Unable to schedule d/t pt traveling. Warm transfer to CAL line.   Patient/caregiver understands and will follow disposition?: - Yes     Copied from CRM [id number]. Topic: Clinical - Red Word Triage >> Nov 13, 2023  4:04 PM Martinique E wrote: [facility] that prompted transfer to Nurse Triage: Pain. Patient experiencing back pain for 3 months, but not pain has radiated towards abdomen area, pain started this morning. Rated pain a level 6-7 out of 10. Reason for Disposition  [1] MODERATE back pain (e.g., interferes with normal activities) AND [2] present > 3 days  Answer Assessment - Initial Assessment Questions 1. ONSET: "When did the pain begin?"      -- This morning     2. LOCATION: "Where does it hurt?" (upper, mid or lower back)     --- lower abd area     3. SEVERITY: "How bad is the pain?"  (e.g., Scale 1-10; mild, moderate, or severe)   - MILD (1-3): Doesn't interfere with normal activities.    - MODERATE (4-7): Interferes with normal activities or awakens from sleep.    - SEVERE (8-10): Excruciating pain, unable to do any normal activities.       ------------------------ 6-7/10 . Pain felt when she stands up or moves.    4. PATTERN: "Is the pain constant?" (e.g., yes, no; constant, intermittent)      --------------- intermitent  5. RADIATION: "Does the pain shoot into your legs or somewhere else?"     --- Pain started from her lower back, now radiates to lower abd area   6. CAUSE:  "What do you think is causing the back pain?"      ------ Unsure    7. BACK OVERUSE:  "Any recent lifting of heavy objects, strenuous work or exercise?"      ---------- Denies   8. MEDICINES: "What have you taken so far for the pain?" (e.g., nothing, acetaminophen, NSAIDS)     ------------ Asprin : Som relief   9. NEUROLOGIC SYMPTOMS: "Do you have any weakness, numbness, or problems with bowel/bladder control?"     ------------ Bladder:" feels like she has to force the initiation of urine stream".   10. OTHER SYMPTOMS: "Do you have any other symptoms?" (e.g., fever, abdomen pain, burning with urination, blood in urine)       --- Can feel her bladder not fully emptying.  --------- Pressure in tailbone area ( constant)  Protocols used: Back Pain-A-AH

## 2023-11-13 NOTE — Telephone Encounter (Signed)
Triage notes 

## 2023-11-16 NOTE — Telephone Encounter (Signed)
 Pt has appt upcoming on 6/12.  Based on sxs, she needs to be seen sooner than that.  Not sure where she is (it says traveling) but would encourage Urgent Care if she still has sxs

## 2023-11-17 NOTE — Telephone Encounter (Signed)
 Left vm for pt to call office. She will need to be scheduled for an appt at time of call if in town.

## 2023-11-18 NOTE — Telephone Encounter (Signed)
 Called pt and left vm Per scheduled - she has appt for abdominal pain 11/20/2023

## 2023-11-20 ENCOUNTER — Ambulatory Visit: Admitting: Family Medicine

## 2023-11-24 ENCOUNTER — Other Ambulatory Visit: Payer: Self-pay | Admitting: Family Medicine

## 2023-12-02 DIAGNOSIS — M65342 Trigger finger, left ring finger: Secondary | ICD-10-CM | POA: Insufficient documentation

## 2023-12-02 DIAGNOSIS — M729 Fibroblastic disorder, unspecified: Secondary | ICD-10-CM | POA: Insufficient documentation

## 2023-12-21 ENCOUNTER — Other Ambulatory Visit: Payer: Self-pay | Admitting: Family Medicine

## 2024-02-24 ENCOUNTER — Encounter: Payer: Self-pay | Admitting: Family Medicine

## 2024-02-24 ENCOUNTER — Ambulatory Visit (INDEPENDENT_AMBULATORY_CARE_PROVIDER_SITE_OTHER): Admitting: Family Medicine

## 2024-02-24 VITALS — BP 122/64 | HR 98 | Temp 98.0°F | Ht 61.5 in | Wt 128.5 lb

## 2024-02-24 DIAGNOSIS — R200 Anesthesia of skin: Secondary | ICD-10-CM

## 2024-02-24 MED ORDER — PREGABALIN 75 MG PO CAPS: 75.0000 mg | ORAL_CAPSULE | Freq: Two times a day (BID) | ORAL | 3 refills | Status: AC

## 2024-02-24 NOTE — Patient Instructions (Signed)
 Follow up as needed or as scheduled START the Lyrica  twice daily to help w/ nerve pain CONTINUE ibuprofen  as needed IF no improvement on Lyrica , schedule a f/u w/ Dr Duwayne to discuss the leg numbness and ongoing back pain Call with any questions or concerns Stay Safe!  Stay Healthy! Hang in there!!!

## 2024-02-24 NOTE — Progress Notes (Signed)
   Subjective:    Patient ID: Sonia Kim, female    DOB: 1955/09/26, 68 y.o.   MRN: [id number]  HPI L leg numbness- pt has known low back pain w/ multilevel DDD of lumbar spine w/ small disc protrusions and central canal stenosis.  Following w/ Dr Duwayne and Dr Bonner.  Rather than getting a 2nd injxn w/ Dr Bonner she wants to try cupping, acupuncture, dry needling.  Numbness occurs when L leg is straight/extended.  Pain improves w/ sitting or lying.  Sxs improve when she takes Advil  for her back pain.  Sxs 1st started a few months ago.  Sxs are described as 'fireworks' going off in her leg.     Review of Systems For ROS see HPI     Objective:   Physical Exam Vitals reviewed.  Constitutional:      General: She is not in acute distress.    Appearance: Normal appearance. She is not ill-appearing.  HENT:     Head: Normocephalic and atraumatic.  Musculoskeletal:     Cervical back: Normal range of motion and neck supple.  Skin:    General: Skin is warm and dry.  Neurological:     Mental Status: She is alert and oriented to person, place, and time.     Cranial Nerves: No cranial nerve deficit.     Motor: No weakness.     Gait: Gait normal.  Psychiatric:        Mood and Affect: Mood normal.        Behavior: Behavior normal.        Thought Content: Thought content normal.           Assessment & Plan:  L leg numbness- new.  Reviewed MRI w/ pt (from Dr Lela note) and discussed that this is likely coming from her back and her known issues.  Will start Lyrica  for her neuropathic leg pain and hopefully it will improve her back pain and her RLS.  If no improvement, encouraged her to schedule f/u w/ Dr Duwayne to discuss.  Pt expressed understanding and is in agreement w/ plan.

## 2024-03-03 ENCOUNTER — Other Ambulatory Visit: Payer: Self-pay | Admitting: Family Medicine

## 2024-03-03 DIAGNOSIS — Z1231 Encounter for screening mammogram for malignant neoplasm of breast: Secondary | ICD-10-CM

## 2024-03-15 DIAGNOSIS — M5416 Radiculopathy, lumbar region: Secondary | ICD-10-CM | POA: Insufficient documentation

## 2024-03-24 ENCOUNTER — Encounter: Payer: Self-pay | Admitting: Family Medicine

## 2024-03-24 ENCOUNTER — Ambulatory Visit: Admitting: Family Medicine

## 2024-03-24 VITALS — BP 128/78 | HR 98 | Ht 61.5 in | Wt 128.0 lb

## 2024-03-24 DIAGNOSIS — E559 Vitamin D deficiency, unspecified: Secondary | ICD-10-CM | POA: Diagnosis not present

## 2024-03-24 DIAGNOSIS — Z0000 Encounter for general adult medical examination without abnormal findings: Secondary | ICD-10-CM | POA: Diagnosis not present

## 2024-03-24 DIAGNOSIS — I10 Essential (primary) hypertension: Secondary | ICD-10-CM

## 2024-03-24 LAB — BASIC METABOLIC PANEL WITH GFR
BUN: 13 mg/dL (ref 6–23)
CO2: 29 meq/L (ref 19–32)
Calcium: 9.3 mg/dL (ref 8.4–10.5)
Chloride: 104 meq/L (ref 96–112)
Creatinine, Ser: 0.66 mg/dL (ref 0.40–1.20)
GFR: 90.61 mL/min (ref >60.00)
Glucose, Bld: 101 mg/dL — ABNORMAL HIGH (ref 70–99)
Potassium: 3.9 meq/L (ref 3.5–5.1)
Sodium: 142 meq/L (ref 135–145)

## 2024-03-24 LAB — CBC WITH DIFFERENTIAL/PLATELET
Basophils Absolute: 0.1 K/uL (ref 0.0–0.1)
Basophils Relative: 0.9 % (ref 0.0–3.0)
Eosinophils Absolute: 0.4 K/uL (ref 0.0–0.7)
Eosinophils Relative: 6.2 % — ABNORMAL HIGH (ref 0.0–5.0)
HCT: 42.8 % (ref 36.0–46.0)
Hemoglobin: 14.2 g/dL (ref 12.0–15.0)
Lymphocytes Relative: 23.6 % (ref 12.0–46.0)
Lymphs Abs: 1.5 K/uL (ref 0.7–4.0)
MCHC: 33.3 g/dL (ref 30.0–36.0)
MCV: 92.7 fl (ref 78.0–100.0)
Monocytes Absolute: 0.4 K/uL (ref 0.1–1.0)
Monocytes Relative: 6.8 % (ref 3.0–12.0)
Neutro Abs: 4 K/uL (ref 1.4–7.7)
Neutrophils Relative %: 62.5 % (ref 43.0–77.0)
Platelets: 222 K/uL (ref 150.0–400.0)
RBC: 4.61 Mil/uL (ref 3.87–5.11)
RDW: 12.5 % (ref 11.5–15.5)
WBC: 6.4 K/uL (ref 4.0–10.5)

## 2024-03-24 LAB — LIPID PANEL
Cholesterol: 185 mg/dL (ref 0–200)
HDL: 64.6 mg/dL (ref >39.00)
LDL Cholesterol: 104 mg/dL — ABNORMAL HIGH (ref 0–99)
NonHDL: 120.01
Total CHOL/HDL Ratio: 3
Triglycerides: 80 mg/dL (ref 0.0–149.0)
VLDL: 16 mg/dL (ref 0.0–40.0)

## 2024-03-24 LAB — HEPATIC FUNCTION PANEL
ALT: 13 U/L (ref 0–35)
AST: 24 U/L (ref 0–37)
Albumin: 4.6 g/dL (ref 3.5–5.2)
Alkaline Phosphatase: 72 U/L (ref 39–117)
Bilirubin, Direct: 0.1 mg/dL (ref 0.0–0.3)
Total Bilirubin: 0.7 mg/dL (ref 0.2–1.2)
Total Protein: 6.3 g/dL (ref 6.0–8.3)

## 2024-03-24 LAB — VITAMIN D 25 HYDROXY (VIT D DEFICIENCY, FRACTURES): VITD: 45.36 ng/mL (ref 30.00–100.00)

## 2024-03-24 LAB — TSH: TSH: 2.3 u[IU]/mL (ref 0.35–5.50)

## 2024-03-24 NOTE — Progress Notes (Signed)
   Subjective:    Patient ID: Sonia Kim, female    DOB: [birth year], 68 y.o.   MRN: [id number]  HPI CPE- UTD on mammo, colonoscopy, flu.  Patient Care Team    Relationship Specialty Notifications Start End  Mahlon Comer BRAVO, MD PCP - General Family Medicine  08/14/16   Claudene Arthea HERO, DO Consulting Physician Sports Medicine  06/19/16   Shlomo Wilbert SAUNDERS, MD Consulting Physician Cardiology  06/19/16   Kristie Lamprey, MD Consulting Physician Gastroenterology  08/14/16   Nada Macintosh, FNP Nurse Practitioner Family Medicine  08/14/16   Beuford Anes, MD Consulting Physician Orthopedic Surgery  03/24/24     Health Maintenance  Topic Date Due   Pneumococcal Vaccine: 50+ Years (1 of 2 - PCV) Never done   DTaP/Tdap/Td (2 - Td or Tdap) 09/12/2023   Mammogram  03/26/2024   Colonoscopy  02/16/2026   Influenza Vaccine  Completed   DEXA SCAN  Completed   Hepatitis C Screening  Completed   Zoster Vaccines- Shingrix  Completed   Meningococcal B Vaccine  Aged Out   COVID-19 Vaccine  Discontinued     Review of Systems Patient reports no vision/ hearing changes, adenopathy,fever, weight change,  persistant/recurrent hoarseness , swallowing issues, chest pain, palpitations, edema, persistant/recurrent cough, hemoptysis, dyspnea (rest/exertional/paroxysmal nocturnal), gastrointestinal bleeding (melena, rectal bleeding), abdominal pain, significant heartburn, bowel changes, GU symptoms (dysuria, hematuria, incontinence), Gyn symptoms (abnormal  bleeding, pain),  syncope, focal weakness, memory loss, numbness & tingling, skin/hair/nail changes, abnormal bruising or bleeding, anxiety, or depression.     Objective:   Physical Exam General Appearance:    Alert, cooperative, no distress, appears stated age  Head:    Normocephalic, without obvious abnormality, atraumatic  Eyes:    PERRL, conjunctiva/corneas clear, EOM's intact both eyes  Ears:    Normal TM's and external ear canals, both ears   Nose:   Nares normal, septum midline, mucosa normal, no drainage    or sinus tenderness  Throat:   Lips, mucosa, and tongue normal; teeth and gums normal  Neck:   Supple, symmetrical, trachea midline, no adenopathy;    Thyroid : no enlargement/tenderness/nodules  Back:     Symmetric, no curvature, ROM normal, no CVA tenderness  Lungs:     Clear to auscultation bilaterally, respirations unlabored  Chest Wall:    No tenderness or deformity   Heart:    Regular rate and rhythm, S1 and S2 normal, no murmur, rub   or gallop  Breast Exam:    Deferred to mammo  Abdomen:     Soft, non-tender, bowel sounds active all four quadrants,    no masses, no organomegaly  Genitalia:    Deferred  Rectal:    Extremities:   Extremities normal, atraumatic, no cyanosis or edema  Pulses:   2+ and symmetric all extremities  Skin:   Skin color, texture, turgor normal, no rashes or lesions  Lymph nodes:   Cervical, supraclavicular, and axillary nodes normal  Neurologic:   CNII-XII intact, normal strength, sensation and reflexes    throughout          Assessment & Plan:

## 2024-03-24 NOTE — Patient Instructions (Addendum)
Follow up in 6 months to recheck BP and cholesterol We'll notify you of your lab results and make any changes if needed Keep up the good work on healthy diet and regular exercise- you look great!! Call with any questions or concerns Stay Safe!  Stay Healthy! Happy Fall!!!

## 2024-03-24 NOTE — Assessment & Plan Note (Signed)
Pt's PE WNL.  UTD on mammo, colonoscopy, flu.  Check labs.  Anticipatory guidance provided.

## 2024-03-25 ENCOUNTER — Ambulatory Visit: Payer: Self-pay | Admitting: Family Medicine

## 2024-03-25 NOTE — Progress Notes (Signed)
 Pt has been notified.

## 2024-03-30 ENCOUNTER — Inpatient Hospital Stay: Admission: RE | Admit: 2024-03-30 | Discharge: 2024-03-30 | Attending: Family Medicine | Admitting: Family Medicine

## 2024-03-30 DIAGNOSIS — Z1231 Encounter for screening mammogram for malignant neoplasm of breast: Secondary | ICD-10-CM

## 2024-04-24 ENCOUNTER — Encounter: Payer: Self-pay | Admitting: Family Medicine

## 2024-04-26 MED ORDER — SERTRALINE HCL 50 MG PO TABS: 50.0000 mg | ORAL_TABLET | Freq: Every day | ORAL | 3 refills | Status: DC

## 2024-04-26 MED ORDER — MONTELUKAST SODIUM 10 MG PO TABS: 10.0000 mg | ORAL_TABLET | Freq: Every day | ORAL | 3 refills | Status: AC

## 2024-05-03 ENCOUNTER — Ambulatory Visit: Payer: Self-pay

## 2024-05-03 NOTE — Telephone Encounter (Signed)
 FYI Only or Action Required?: FYI only for provider: appointment scheduled on 05/04/24.  Patient was last seen in primary care on 03/24/2024 by Mahlon Comer BRAVO, MD.  Called Nurse Triage reporting Shortness of Breath and Cough.  Symptoms began a week ago.  Interventions attempted: Prescription medications: INH.  Symptoms are: gradually worsening.  Triage Disposition: See Physician Within 24 Hours  Patient/caregiver understands and will follow disposition?: Yes         Copied from CRM #[id number]. Topic: Clinical - Red Word Triage >> May 03, 2024  1:01 PM Frederich PARAS wrote: [facility] that prompted transfer to Nurse Triage: shortness of breath, productive cough  shortness  of breath pass 1 week upper respiratory, hard to breath and she has been using her inhaler more, coughing up nasty film, cough is not going away. Reason for Disposition  Coughing up rusty-colored (reddish-brown) sputum  Answer Assessment - Initial Assessment Questions 1. ONSET: When did the cough begin?      1 week 2. SEVERITY: How bad is the cough today?      Worse when laying down 3. SPUTUM: Describe the color of your sputum (e.g., none, dry cough; clear, white, yellow, green)     Dark brown 4. HEMOPTYSIS: Are you coughing up any blood? If Yes, ask: How much? (e.g., flecks, streaks, tablespoons, etc.)     denies 5. DIFFICULTY BREATHING: Are you having difficulty breathing? If Yes, ask: How bad is it? (e.g., mild, moderate, severe)      *No Answer* 6. FEVER: Do you have a fever? If Yes, ask: What is your temperature, how was it measured, and when did it start?     denies 7. CARDIAC HISTORY: Do you have any history of heart disease? (e.g., heart attack, congestive heart failure)      denies 8. LUNG HISTORY: Do you have any history of lung disease?  (e.g., pulmonary embolus, asthma, emphysema)     asthma 9. PE RISK FACTORS: Do you have a history of blood clots? (or: recent major  surgery, recent prolonged travel, bedridden)     denies 10. OTHER SYMPTOMS: Do you have any other symptoms? (e.g., runny nose, wheezing, chest pain)       Sore throat from cough, sinus, sores in mouth 11. PREGNANCY: Is there any chance you are pregnant? When was your last menstrual period?       N/a 12. TRAVEL: Have you traveled out of the country in the last month? (e.g., travel history, exposures)       N/a  Protocols used: Cough - Acute Productive-A-AH

## 2024-05-03 NOTE — Telephone Encounter (Signed)
 FYI - appt with Dr. Jerrell tomorrow 05/04/24

## 2024-05-04 ENCOUNTER — Encounter: Payer: Self-pay | Admitting: Student in an Organized Health Care Education/Training Program

## 2024-05-04 ENCOUNTER — Ambulatory Visit (INDEPENDENT_AMBULATORY_CARE_PROVIDER_SITE_OTHER): Admitting: Student in an Organized Health Care Education/Training Program

## 2024-05-04 VITALS — BP 131/56 | HR 82 | Ht 61.5 in | Wt 126.6 lb

## 2024-05-04 DIAGNOSIS — J4541 Moderate persistent asthma with (acute) exacerbation: Secondary | ICD-10-CM | POA: Diagnosis not present

## 2024-05-04 DIAGNOSIS — J45901 Unspecified asthma with (acute) exacerbation: Secondary | ICD-10-CM | POA: Insufficient documentation

## 2024-05-04 MED ORDER — BUDESONIDE-FORMOTEROL FUMARATE 160-4.5 MCG/ACT IN AERO: 2.0000 | INHALATION_SPRAY | Freq: Two times a day (BID) | RESPIRATORY_TRACT | 3 refills | Status: AC

## 2024-05-04 MED ORDER — ALBUTEROL SULFATE (2.5 MG/3ML) 0.083% IN NEBU: 2.5000 mg | INHALATION_SOLUTION | Freq: Once | RESPIRATORY_TRACT | Status: AC

## 2024-05-04 MED ORDER — PREDNISONE 20 MG PO TABS: 40.0000 mg | ORAL_TABLET | Freq: Every day | ORAL | 0 refills | Status: AC

## 2024-05-04 NOTE — Assessment & Plan Note (Signed)
 Acute exacerbation likely due to allergies, with moderate wheezing indicating significant airway constriction. Administered albuterol  breathing treatment in clinic. Prescribed prednisone  40 mg daily for 5 days. Prescribed Symbicort  inhaler for daily use and as needed for symptoms.  I think she likely is going to have persistent asthma symptoms during the months of her allergy season.  I recommended she use this at least twice daily, and as needed for anti-inflammatory reliever therapy.

## 2024-05-04 NOTE — Progress Notes (Signed)
 Acute Office Visit  Patient ID: Sonia Kim, female    DOB: 1956/04/18, 68 y.o.   MRN: [id number]  PCP: Mahlon Comer BRAVO, MD  Chief Complaint  Patient presents with   Cough    Sx started over a week ago. Stuffy nose. Hx of asthma. Worse at night when laying down    Subjective:     HPI  Discussed the use of AI scribe software for clinical note transcription with the patient, who gave verbal consent to proceed.  History of Present Illness Sonia Kim is a 68 year old female with asthma who presents with persistent cough and wheezing.  She has been experiencing a persistent cough and wheezing for the past week and a half, which she attributes to her annual allergies. The cough is described as 'nasty' and 'scratchy,' and it has not resolved with her usual treatments. She has been using her albuterol  inhaler more frequently than usual, but it has not provided sufficient relief. Additionally, she uses Mucinex  and an over-the-counter cough medicine, but these have not been effective.  She reports that she experiences nighttime symptoms during this season, including waking up due to coughing and feeling unable to breathe, which she describes as 'terrifying.' She also experiences shortness of breath when walking and wheezing, particularly at night. No fever, chills, or nasal drainage are present, but she reports an itchy sensation in her ears and a sore throat from coughing. She brings up some brown, thick sputum throughout the day, with nighttime being the worst time for her symptoms.  She has not been hospitalized for asthma but recalls a severe episode two years ago that required an emergency breathing treatment. She has prednisone  at home for her back but has not taken it for her current symptoms. Her insurance does not cover certain inhalers, leading to frequent changes in her asthma medication. She currently only uses albuterol  and has not used other inhalers like  Symbicort  or Advair recently.      Objective:    BP (!) 131/56 (BP Location: Right Arm, Patient Position: Sitting, Cuff Size: Normal)   Pulse 82   Ht 5' 1.5 (1.562 m)   Wt 126 lb 9.6 oz (57.4 kg)   LMP 03/10/2010   SpO2 97%   BMI 23.53 kg/m   Physical Exam  Gen: Well-appearing woman Ears: Normal bilateral ear canals, tympanic membranes slightly erythematous but no effusions Mouth: Normal posterior oropharynx Neck: No tender adenopathy Heart: Regular, no murmur Lungs: Unlabored, diffuse moderate inspiratory and expiratory wheezing in the upper and lower lobes bilaterally, no crackles     Assessment & Plan:   Problem List Items Addressed This Visit       Unprioritized   Asthma with exacerbation - Primary   Acute exacerbation likely due to allergies, with moderate wheezing indicating significant airway constriction. Administered albuterol  breathing treatment in clinic. Prescribed prednisone  40 mg daily for 5 days. Prescribed Symbicort  inhaler for daily use and as needed for symptoms.  I think she likely is going to have persistent asthma symptoms during the months of her allergy season.  I recommended she use this at least twice daily, and as needed for anti-inflammatory reliever therapy.       Relevant Medications   predniSONE  (DELTASONE ) 20 MG tablet   budesonide -formoterol  (SYMBICORT ) 160-4.5 MCG/ACT inhaler   albuterol  (PROVENTIL ) (2.5 MG/3ML) 0.083% nebulizer solution 2.5 mg (Start on 05/04/2024  4:30 PM)    Meds ordered this encounter  Medications   predniSONE  (DELTASONE ) 20  MG tablet    Sig: Take 2 tablets (40 mg total) by mouth daily with breakfast for 5 days.    Dispense:  10 tablet    Refill:  0   budesonide -formoterol  (SYMBICORT ) 160-4.5 MCG/ACT inhaler    Sig: Inhale 2 puffs into the lungs 2 (two) times daily.    Dispense:  1 each    Refill:  3   albuterol  (PROVENTIL ) (2.5 MG/3ML) 0.083% nebulizer solution 2.5 mg    Return if symptoms worsen or fail to  improve.  Cleatus Debby Specking, MD [facility]

## 2024-05-04 NOTE — Patient Instructions (Signed)
  VISIT SUMMARY: Today, you were seen for a persistent cough and wheezing, which you have been experiencing for the past week and a half. These symptoms are likely due to your annual allergies and have not improved with your usual treatments.  YOUR PLAN: -ASTHMA WITH ACUTE EXACERBATION: You are experiencing a worsening of your asthma symptoms, likely triggered by allergies. This has caused significant airway constriction, leading to severe wheezing and a persistent cough. In the clinic, you received an albuterol  breathing treatment to help open your airways. You have been prescribed prednisone  40 mg daily for 5 days to reduce inflammation in your airways. Additionally, you have been prescribed a Symbicort  inhaler to use daily and as needed to manage your symptoms. Please schedule a follow-up appointment in 1-2 months to monitor your progress.  INSTRUCTIONS: Please schedule a follow-up appointment in 1-2 months to monitor your progress. Use the Symbicort  inhaler daily and as needed for symptoms, and take prednisone  40 mg daily for 5 days as prescribed.

## 2024-05-19 ENCOUNTER — Other Ambulatory Visit: Payer: Self-pay | Admitting: Family Medicine

## 2024-05-27 ENCOUNTER — Ambulatory Visit: Admitting: Family Medicine

## 2024-05-27 ENCOUNTER — Encounter: Payer: Self-pay | Admitting: Family Medicine

## 2024-05-27 VITALS — BP 126/52 | HR 66 | Temp 98.2°F | Ht 61.5 in | Wt 125.2 lb

## 2024-05-27 DIAGNOSIS — J4540 Moderate persistent asthma, uncomplicated: Secondary | ICD-10-CM | POA: Diagnosis not present

## 2024-05-27 DIAGNOSIS — F419 Anxiety disorder, unspecified: Secondary | ICD-10-CM

## 2024-05-27 MED ORDER — ALBUTEROL SULFATE HFA 108 (90 BASE) MCG/ACT IN AERS: 2.0000 | INHALATION_SPRAY | RESPIRATORY_TRACT | 3 refills | Status: AC | PRN

## 2024-05-27 NOTE — Assessment & Plan Note (Signed)
 Ongoing issue.  Currently on Sertraline  50mg  daily and feels that she is doing better.  Is not in counseling but would likely benefit given the ongoing issues w/ her daughter.  Provided information for [facility].  Pt appreciative.

## 2024-05-27 NOTE — Assessment & Plan Note (Signed)
 Improved w/ starting Symbicort .  Using albuterol  prn.

## 2024-05-27 NOTE — Patient Instructions (Addendum)
 Follow up as needed or as scheduled CONTINUE the Symbicort  daily as directed USE the Albuterol  as needed for cough or wheeze Check out https://www.[email or web address].[email or web address]/find-[email or web address] and see if you can get plugged in with people and resources going through similar situations Call with any questions or concerns Stay Safe!  Stay Healthy! Happy Holidays!

## 2024-05-27 NOTE — Progress Notes (Signed)
° °  Subjective:    Patient ID: Sonia Kim, female    DOB: 1955-12-23, 68 y.o.   MRN: [id number]  HPI Moderate persistent asthma- saw Dr Jerrell in October and was started on Symbicort  for daily use.  Completed course of Prednisone .  Has albuterol  to use prn.  Pt reports breathing has been 'ok'.  Continues to be very sensitive to smells/fumes.  Will still have occasional wheezing when she lies down.  Anxiety- ongoing issue.  Daughter is living at home w/ her.  Has hx of drug use and has been homeless in the past.  Pt is concerned b/c husband will be retiring and they have plans but she doesn't want to leave her in the house alone.  Currently on Sertraline  50mg  daily.  Asking about possible resources   Review of Systems For ROS see HPI     Objective:   Physical Exam Vitals reviewed.  Constitutional:      General: She is not in acute distress.    Appearance: Normal appearance. She is well-developed. She is not ill-appearing.  HENT:     Head: Normocephalic and atraumatic.  Eyes:     Conjunctiva/sclera: Conjunctivae normal.     Pupils: Pupils are equal, round, and reactive to light.  Neck:     Thyroid : No thyromegaly.  Cardiovascular:     Rate and Rhythm: Normal rate and regular rhythm.     Heart sounds: Normal heart sounds. No murmur heard. Pulmonary:     Effort: Pulmonary effort is normal. No respiratory distress.     Breath sounds: Wheezing (end inspiratory wheezes diffusely) present.  Abdominal:     General: There is no distension.     Palpations: Abdomen is soft.     Tenderness: There is no abdominal tenderness.  Musculoskeletal:     Cervical back: Normal range of motion and neck supple.  Lymphadenopathy:     Cervical: No cervical adenopathy.  Skin:    General: Skin is warm and dry.  Neurological:     General: No focal deficit present.     Mental Status: She is alert and oriented to person, place, and time.  Psychiatric:        Mood and Affect: Mood normal.         Behavior: Behavior normal.        Thought Content: Thought content normal.           Assessment & Plan:

## 2024-06-07 ENCOUNTER — Other Ambulatory Visit: Payer: Self-pay | Admitting: Family Medicine

## 2024-06-14 ENCOUNTER — Other Ambulatory Visit: Payer: Self-pay | Admitting: Family Medicine

## 2024-06-28 ENCOUNTER — Other Ambulatory Visit: Payer: Self-pay | Admitting: Family Medicine

## 2024-09-22 ENCOUNTER — Ambulatory Visit: Admitting: Family Medicine
